# Patient Record
Sex: Female | Born: 1980
Health system: Southern US, Community
[De-identification: ages and names within clinical notes are randomized; demographics above are authoritative.]

## PROBLEM LIST (undated history)

## (undated) DIAGNOSIS — D219 Benign neoplasm of connective and other soft tissue, unspecified: Secondary | ICD-10-CM

## (undated) DIAGNOSIS — L98439 Non-pressure chronic ulcer of abdomen with unspecified severity: Secondary | ICD-10-CM

## (undated) DIAGNOSIS — D649 Anemia, unspecified: Secondary | ICD-10-CM

## (undated) DIAGNOSIS — D573 Sickle-cell trait: Secondary | ICD-10-CM

## (undated) DIAGNOSIS — Z5189 Encounter for other specified aftercare: Secondary | ICD-10-CM

## (undated) DIAGNOSIS — K219 Gastro-esophageal reflux disease without esophagitis: Secondary | ICD-10-CM

## (undated) DIAGNOSIS — L98499 Non-pressure chronic ulcer of skin of other sites with unspecified severity: Secondary | ICD-10-CM

## (undated) HISTORY — PX: WISDOM TOOTH EXTRACTION: SHX21

## (undated) HISTORY — DX: Benign neoplasm of connective and other soft tissue, unspecified: D21.9

## (undated) HISTORY — DX: Non-pressure chronic ulcer of skin of other sites with unspecified severity: L98.499

## (undated) HISTORY — DX: Anemia, unspecified: D64.9

## (undated) HISTORY — DX: Non-pressure chronic ulcer of abdomen with unspecified severity: L98.439

---

## 2005-12-23 ENCOUNTER — Emergency Department: Payer: Self-pay | Admitting: Emergency Medicine

## 2006-09-24 ENCOUNTER — Emergency Department: Payer: Self-pay | Admitting: Emergency Medicine

## 2006-10-04 ENCOUNTER — Emergency Department: Payer: Self-pay | Admitting: Emergency Medicine

## 2007-10-17 IMAGING — CR RIGHT FOOT COMPLETE - 3+ VIEW
1 series · 3 of 3 positions shown · non-contrast
Comparison: none

REASON FOR EXAM: Injury.....MINOR CARE 2
COMMENTS:  LMP: [DATE]

[Series 1: view not recorded · 0.17mm/px · 3 of 3 slices shown]
[im 1/3]
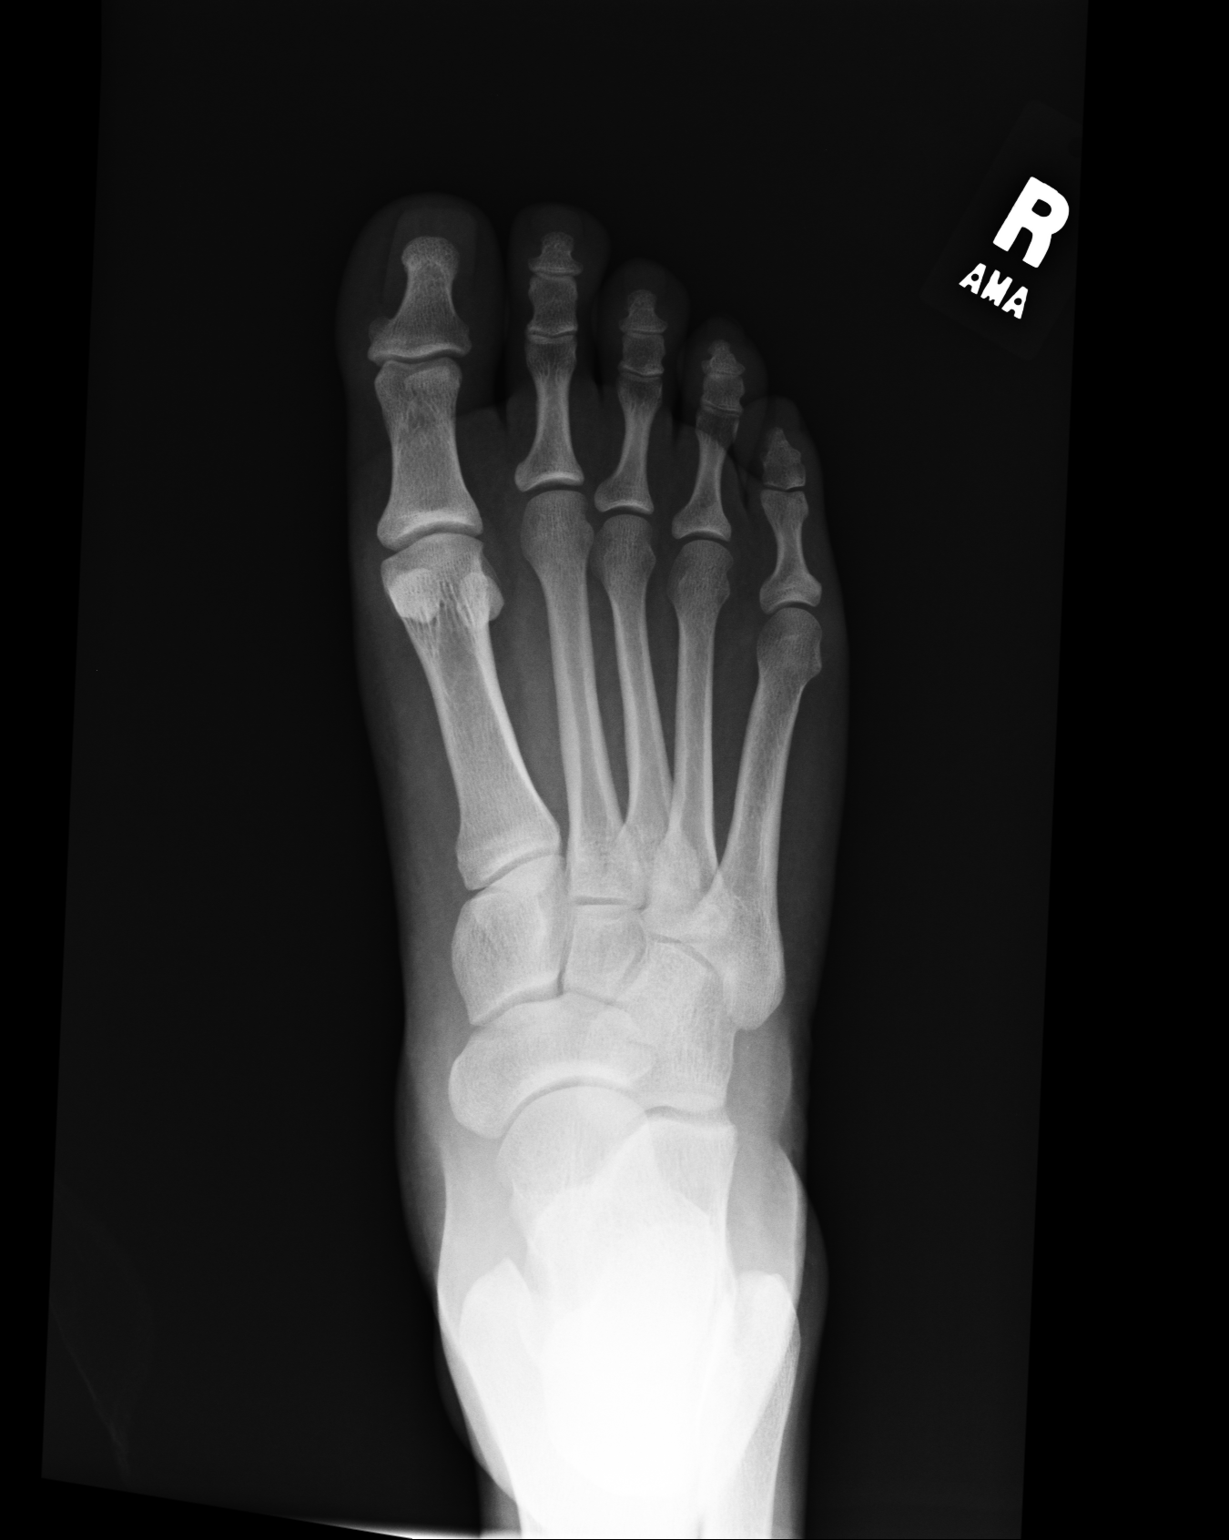
[im 2/3]
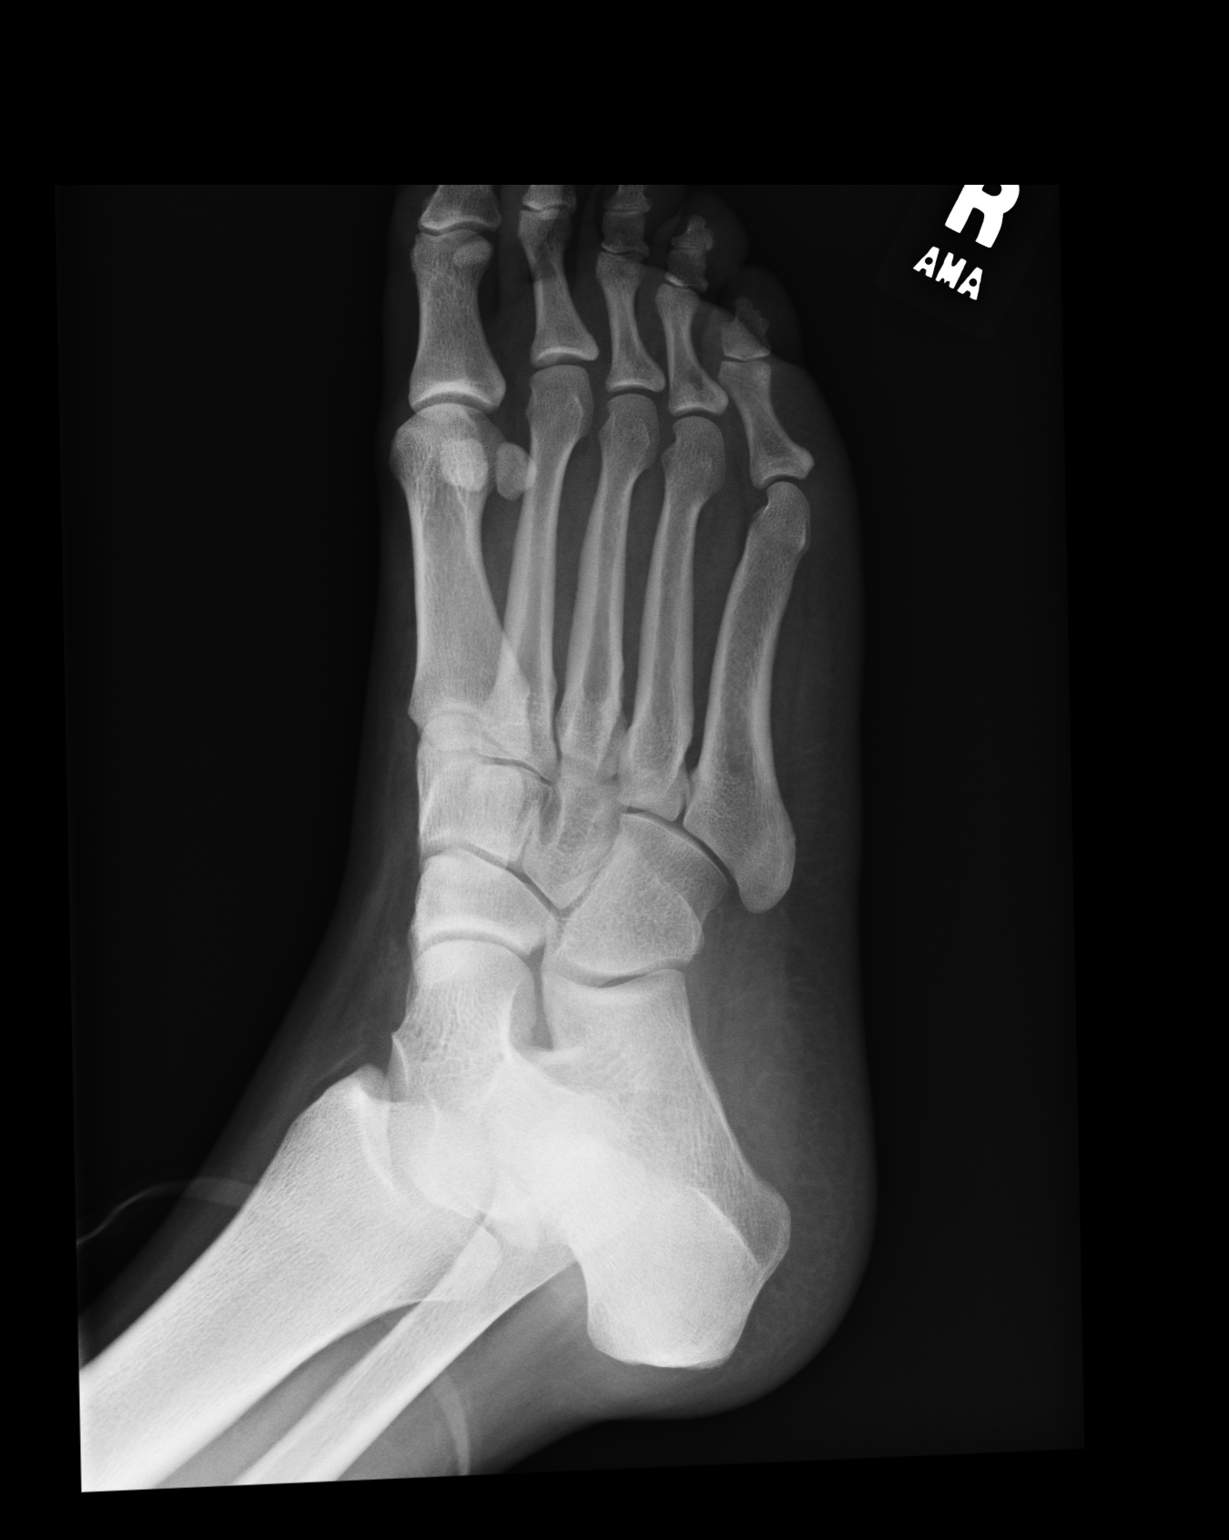
[im 3/3]
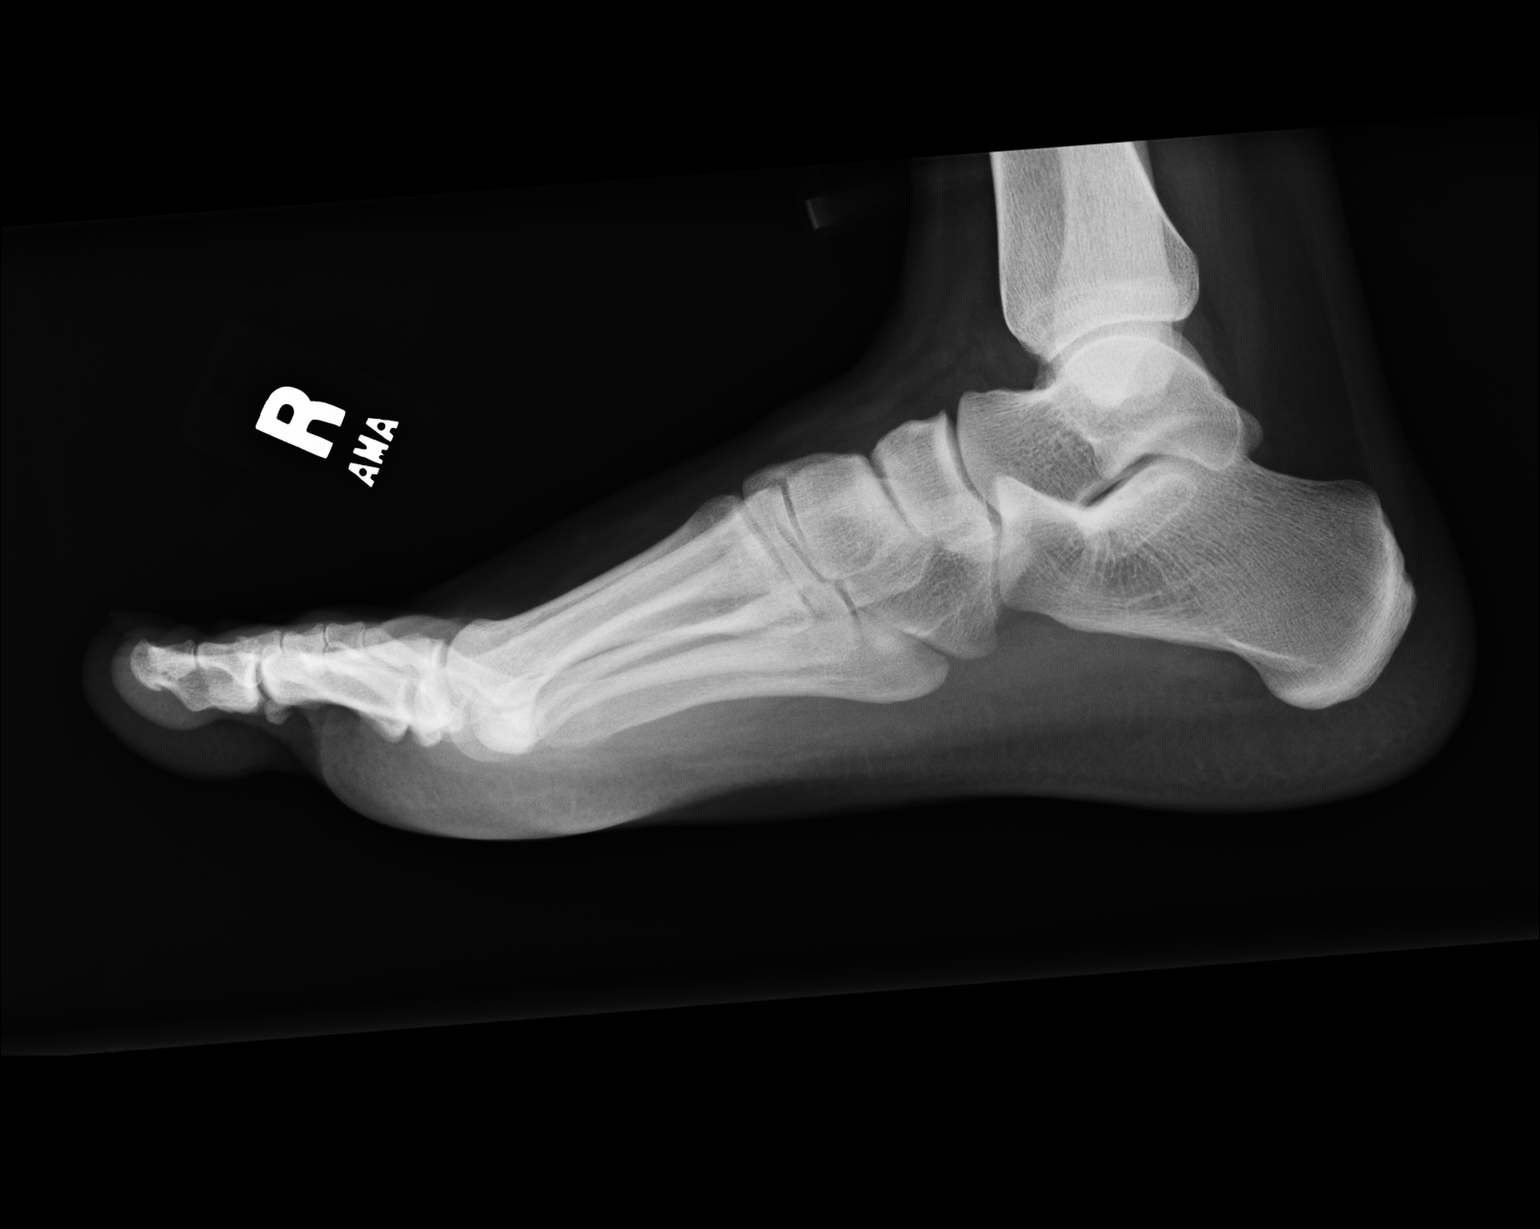

[3 of 3 positions shown; findings below may reference images not displayed]

PROCEDURE:     DXR - DXR FOOT RT COMPLETE W/OBLIQUES  - October 04, 2006  [DATE]

RESULT:     The patient sustained a puncture wound involving the RIGHT foot.

The bones of the foot appear adequately mineralized.  I do not see evidence
of acute fracture.  I see no soft tissue gas or radiopaque material.
IMPRESSION: I do not see acute bony abnormality of the RIGHT foot or evidence of
retained radiopaque foreign body.

## 2008-01-21 ENCOUNTER — Emergency Department: Payer: Self-pay | Admitting: Emergency Medicine

## 2010-12-14 ENCOUNTER — Emergency Department: Payer: Self-pay | Admitting: Emergency Medicine

## 2011-03-10 ENCOUNTER — Emergency Department: Payer: Self-pay | Admitting: Emergency Medicine

## 2011-12-16 ENCOUNTER — Emergency Department: Payer: Self-pay | Admitting: Emergency Medicine

## 2013-05-28 ENCOUNTER — Emergency Department: Payer: Self-pay | Admitting: Emergency Medicine

## 2013-05-31 ENCOUNTER — Emergency Department: Payer: Self-pay | Admitting: Emergency Medicine

## 2014-01-13 ENCOUNTER — Emergency Department: Payer: Self-pay | Admitting: Emergency Medicine

## 2014-09-16 ENCOUNTER — Emergency Department: Payer: Self-pay | Admitting: Emergency Medicine

## 2015-04-27 ENCOUNTER — Emergency Department
Admission: EM | Admit: 2015-04-27 | Discharge: 2015-04-27 | Disposition: A | Discharge status: Home / Self Care | Payer: Self-pay | Attending: Emergency Medicine | Admitting: Emergency Medicine

## 2015-04-27 ENCOUNTER — Encounter: Payer: Self-pay | Admitting: Emergency Medicine

## 2015-04-27 DIAGNOSIS — Y288XXA Contact with other sharp object, undetermined intent, initial encounter: Secondary | ICD-10-CM | POA: Insufficient documentation

## 2015-04-27 DIAGNOSIS — Z79899 Other long term (current) drug therapy: Secondary | ICD-10-CM | POA: Insufficient documentation

## 2015-04-27 DIAGNOSIS — Y9289 Other specified places as the place of occurrence of the external cause: Secondary | ICD-10-CM | POA: Insufficient documentation

## 2015-04-27 DIAGNOSIS — Y998 Other external cause status: Secondary | ICD-10-CM | POA: Insufficient documentation

## 2015-04-27 DIAGNOSIS — S81811A Laceration without foreign body, right lower leg, initial encounter: Secondary | ICD-10-CM | POA: Insufficient documentation

## 2015-04-27 DIAGNOSIS — Z720 Tobacco use: Secondary | ICD-10-CM | POA: Insufficient documentation

## 2015-04-27 DIAGNOSIS — Y9389 Activity, other specified: Secondary | ICD-10-CM | POA: Insufficient documentation

## 2015-04-27 DIAGNOSIS — Z23 Encounter for immunization: Secondary | ICD-10-CM | POA: Insufficient documentation

## 2015-04-27 HISTORY — DX: Sickle-cell trait: D57.3

## 2015-04-27 MED ORDER — BACITRACIN ZINC 500 UNIT/GM EX OINT
TOPICAL_OINTMENT | CUTANEOUS | Status: AC
  Administered 2015-04-27: 05:00:00 via TRANSDERMAL
  Filled 2015-04-27: qty 0.9

## 2015-04-27 MED ORDER — TRAMADOL HCL 50 MG PO TABS: 50.0000 mg | ORAL_TABLET | Freq: Four times a day (QID) | ORAL | Status: DC | PRN

## 2015-04-27 MED ORDER — TETANUS-DIPHTH-ACELL PERTUSSIS 5-2.5-18.5 LF-MCG/0.5 IM SUSP
0.5000 mL | Freq: Once | INTRAMUSCULAR | Status: AC
  Administered 2015-04-27: 0.5 mL via INTRAMUSCULAR
  Filled 2015-04-27: qty 0.5

## 2015-04-27 MED ORDER — LIDOCAINE-EPINEPHRINE (PF) 1 %-1:200000 IJ SOLN
INTRAMUSCULAR | Status: AC
  Filled 2015-04-27: qty 30

## 2015-04-27 NOTE — ED Notes (Signed)
Pt with laceration to the right shin; says she was swinging around a broken metal broom when it struck her leg; bleeding controlled; dressing applied in triage

## 2015-04-27 NOTE — Discharge Instructions (Signed)
Please see her doctor or return to the emergency department in 7-10 days for suture removal. Return sooner, if he notices any signs of infection such as increased redness, pus, or develop a fever. Please keep the area clean and you may use warm soap and water to clean. Keep covered with Neosporin and a dressing.   Laceration Care, Adult A laceration is a cut that goes through all layers of the skin. The cut goes into the tissue beneath the skin. HOME CARE For stitches (sutures) or staples:  Keep the cut clean and dry.  If you have a bandage (dressing), change it at least once a day. Change the bandage if it gets wet or dirty, or as told by your doctor.  Wash the cut with soap and water 2 times a day. Rinse the cut with water. Pat it dry with a clean towel.  Put a thin layer of medicated cream on the cut as told by your doctor.  You may shower after the first 24 hours. Do not soak the cut in water until the stitches are removed.  Only take medicines as told by your doctor.  Have your stitches or staples removed as told by your doctor. For skin adhesive strips:  Keep the cut clean and dry.  Do not get the strips wet. You may take a bath, but be careful to keep the cut dry.  If the cut gets wet, pat it dry with a clean towel.  The strips will fall off on their own. Do not remove the strips that are still stuck to the cut. For wound glue:  You may shower or take baths. Do not soak or scrub the cut. Do not swim. Avoid heavy sweating until the glue falls off on its own. After a shower or bath, pat the cut dry with a clean towel.  Do not put medicine on your cut until the glue falls off.  If you have a bandage, do not put tape over the glue.  Avoid lots of sunlight or tanning lamps until the glue falls off. Put sunscreen on the cut for the first year to reduce your scar.  The glue will fall off on its own. Do not pick at the glue. You may need a tetanus shot if:  You cannot  remember when you had your last tetanus shot.  You have never had a tetanus shot. If you need a tetanus shot and you choose not to have one, you may get tetanus. Sickness from tetanus can be serious. GET HELP RIGHT AWAY IF:   Your pain does not get better with medicine.  Your arm, hand, leg, or foot loses feeling (numbness) or changes color.  Your cut is bleeding.  Your joint feels weak, or you cannot use your joint.  You have painful lumps on your body.  Your cut is red, puffy (swollen), or painful.  You have a red line on the skin near the cut.  You have yellowish-white fluid (pus) coming from the cut.  You have a fever.  You have a bad smell coming from the cut or bandage.  Your cut breaks open before or after stitches are removed.  You notice something coming out of the cut, such as wood or glass.  You cannot move a finger or toe. MAKE SURE YOU:   Understand these instructions.  Will watch your condition.  Will get help right away if you are not doing well or get worse. Document Released: 03/07/2008 Document Revised:  12/12/2011 Document Reviewed: 03/15/2011 ExitCare Patient Information 2015 [place], [place]. This information is not intended to replace advice given to you by your health care provider. Make sure you discuss any questions you have with your health care provider.

## 2015-04-27 NOTE — ED Provider Notes (Signed)
[facility] Emergency Department Provider Note  Time seen: 3:49 AM  I have reviewed the triage vital signs and the nursing notes.   HISTORY  Chief Complaint Extremity Laceration    HPI Carolyn [name] is a 34 y.o. female who presents the emergency department with a laceration to her right lower [place]. Patient states she had a right lower extremity with a metal broom. States the bleeding was controlled at home with pressure. Patient does not know her last tetanus shot was. Patient denies any numbness or tingling.     Past Medical History  Diagnosis Date  . Sickle cell trait     There are no active problems to display for this patient.   History reviewed. No pertinent past surgical history.  Current Outpatient Rx  Name  Route  Sig  Dispense  Refill  . diphenhydramine-acetaminophen (TYLENOL PM) 25-500 MG TABS   Oral   Take 1 tablet by mouth at bedtime as needed.         . Multiple Vitamin (MULTIVITAMIN WITH MINERALS) TABS tablet   Oral   Take 1 tablet by mouth daily.           Allergies Review of patient's allergies indicates no known allergies.  History reviewed. No pertinent family history.  Social History History  Substance Use Topics  . Smoking status: Current Every Day Smoker  . Smokeless tobacco: Not on file  . Alcohol Use: Yes    Review of Systems Constitutional: Negative for fever. Cardiovascular: Negative for chest pain. Respiratory: Negative for shortness of breath. Gastrointestinal: Negative for abdominal pain Skin: Positive for laceration to right lower extremity. 10-point ROS otherwise negative.  ____________________________________________   PHYSICAL EXAM:  VITAL SIGNS: ED Triage Vitals  Enc Vitals Group     BP 04/27/15 0037 124/66 mmHg     Pulse Rate 04/27/15 0037 89     Resp 04/27/15 0037 18     Temp 04/27/15 0037 98.6 F (37 C)     Temp src --      SpO2 04/27/15 0037 100 %     Weight 04/27/15  0037 180 lb (81.647 kg)     Height 04/27/15 0037 5\' 4"  (1.626 m)     Head Cir --      Peak Flow --      Pain Score 04/27/15 0038 7     Pain Loc --      Pain Edu? --      Excl. in [place]? --     Constitutional: Alert and oriented. Well appearing and in no distress. ENT   Head: Normocephalic and atraumatic. Cardiovascular: Normal rate, regular rhythm. No murmur Respiratory: Normal respiratory effort without tachypnea nor retractions. Breath sounds are clear Gastrointestinal: Soft and nontender. No distention. Musculoskeletal: 4 cm laceration to right lower extremity. Hemostatic. Neurovascularly intact distally. Neurologic:  Normal speech and language. No gross focal neurologic deficits Skin:  Skin is warm, dry. Laceration as above. Psychiatric: Mood and affect are normal. Speech and behavior are normal.  ____________________________________________   INITIAL IMPRESSION / ASSESSMENT AND PLAN / ED COURSE  Pertinent labs & imaging results that were available during my care of the patient were reviewed by me and considered in my medical decision making (see chart for details).  Patient were for semi-laceration right lower extremity. No other injuries identified. Currently hemostatic. Patient repaired with sutures. Tetanus updated.  LACERATION REPAIR Performed by: Harvest Dark Authorized by: Harvest Dark Consent: Verbal consent obtained. Risks and benefits: risks,  benefits and alternatives were discussed Consent given by: patient Patient identity confirmed: provided demographic data Prepped and Draped in normal sterile fashion Wound explored  Laceration Location: Right lower extremity  Laceration Length: 4 cm  No Foreign Bodies seen or palpated  Anesthesia: local infiltration  Local anesthetic: lidocaine 1 % with epinephrine  Anesthetic total: 8 ml  Irrigation method: syringe Amount of cleaning: standard  Skin closure: Sutures, 4-0 proline   Number of  sutures: 7   Technique: Simple and rapid   Patient tolerance: Patient tolerated the procedure well with no immediate complications.  ____________________________________________   FINAL CLINICAL IMPRESSION(S) / ED DIAGNOSES  Laceration   Harvest Dark, MD 04/27/15 [phone]

## 2015-07-14 DIAGNOSIS — A6000 Herpesviral infection of urogenital system, unspecified: Secondary | ICD-10-CM | POA: Insufficient documentation

## 2015-07-14 DIAGNOSIS — E663 Overweight: Secondary | ICD-10-CM | POA: Insufficient documentation

## 2015-09-15 ENCOUNTER — Encounter: Payer: Self-pay | Admitting: Emergency Medicine

## 2015-09-15 ENCOUNTER — Emergency Department
Admission: EM | Admit: 2015-09-15 | Discharge: 2015-09-16 | Disposition: A | Discharge status: Home / Self Care | Payer: Self-pay | Attending: Emergency Medicine | Admitting: Emergency Medicine

## 2015-09-15 DIAGNOSIS — X58XXXA Exposure to other specified factors, initial encounter: Secondary | ICD-10-CM | POA: Insufficient documentation

## 2015-09-15 DIAGNOSIS — Y998 Other external cause status: Secondary | ICD-10-CM | POA: Insufficient documentation

## 2015-09-15 DIAGNOSIS — M545 Low back pain, unspecified: Secondary | ICD-10-CM

## 2015-09-15 DIAGNOSIS — S3992XA Unspecified injury of lower back, initial encounter: Secondary | ICD-10-CM | POA: Insufficient documentation

## 2015-09-15 DIAGNOSIS — Y9289 Other specified places as the place of occurrence of the external cause: Secondary | ICD-10-CM | POA: Insufficient documentation

## 2015-09-15 DIAGNOSIS — F17200 Nicotine dependence, unspecified, uncomplicated: Secondary | ICD-10-CM | POA: Insufficient documentation

## 2015-09-15 DIAGNOSIS — Z79899 Other long term (current) drug therapy: Secondary | ICD-10-CM | POA: Insufficient documentation

## 2015-09-15 DIAGNOSIS — Y9389 Activity, other specified: Secondary | ICD-10-CM | POA: Insufficient documentation

## 2015-09-15 NOTE — ED Provider Notes (Signed)
[facility] Emergency Department Provider Note  ____________________________________________  Time seen:   I have reviewed the triage vital signs and the nursing notes.   HISTORY  Chief Complaint Back Pain    HPI Carolyn [name] is a 34 y.o. female presents with bilateral low back pain 2 days after moving furniture. Patient denies any leg weakness or numbness no radiation of pain into the legs. Patient denies any urinary symptoms or change in bowel habits. Patient denies any fever. Pain is aggravated with ambulation and movement of the back. Current pain score 7 out of 10.    Past Medical History  Diagnosis Date  . Sickle cell trait ([place])     There are no active problems to display for this patient.   History reviewed. No pertinent past surgical history.  Current Outpatient Rx  Name  Route  Sig  Dispense  Refill  . diphenhydramine-acetaminophen (TYLENOL PM) 25-500 MG TABS   Oral   Take 1 tablet by mouth at bedtime as needed.         . Multiple Vitamin (MULTIVITAMIN WITH MINERALS) TABS tablet   Oral   Take 1 tablet by mouth daily.         . traMADol (ULTRAM) 50 MG tablet   Oral   Take 1 tablet (50 mg total) by mouth every 6 (six) hours as needed.   20 tablet   0     Allergies Review of patient's allergies indicates no known allergies.  No family history on file.  Social History Social History  Substance Use Topics  . Smoking status: Current Every Day Smoker  . Smokeless tobacco: None  . Alcohol Use: Yes    Review of Systems  Constitutional: Negative for fever. Eyes: Negative for visual changes. ENT: Negative for sore throat. Cardiovascular: Negative for chest pain. Respiratory: Negative for shortness of breath. Gastrointestinal: Negative for abdominal pain, vomiting and diarrhea. Genitourinary: Negative for dysuria. Musculoskeletal: Positive for back pain. Skin: Negative for rash. Neurological: Negative for  headaches, focal weakness or numbness.   10-point ROS otherwise negative.  ____________________________________________   PHYSICAL EXAM:  VITAL SIGNS: ED Triage Vitals  Enc Vitals Group     BP 09/15/15 2330 119/89 mmHg     Pulse Rate 09/15/15 2330 83     Resp 09/15/15 2330 18     Temp 09/15/15 2330 97.9 F (36.6 C)     Temp Source 09/15/15 2330 Oral     SpO2 09/15/15 2330 100 %     Weight 09/15/15 2330 175 lb (79.379 kg)     Height 09/15/15 2330 5\' 5"  (1.651 m)     Head Cir --      Peak Flow --      Pain Score 09/15/15 2329 10     Pain Loc --      Pain Edu? --      Excl. in [place]? --      Constitutional: Alert and oriented. Well appearing and in no distress. Eyes: Conjunctivae are normal. PERRL. Normal extraocular movements. ENT   Head: Normocephalic and atraumatic.   Nose: No congestion/rhinnorhea.   Mouth/Throat: Mucous membranes are moist.   Neck: No stridor. Hematological/Lymphatic/Immunilogical: No cervical lymphadenopathy. Cardiovascular: Normal rate, regular rhythm. Normal and symmetric distal pulses are present in all extremities. No murmurs, rubs, or gallops. Respiratory: Normal respiratory effort without tachypnea nor retractions. Breath sounds are clear and equal bilaterally. No wheezes/rales/rhonchi. Gastrointestinal: Soft and nontender. No distention. There is no CVA tenderness. Genitourinary: deferred  Musculoskeletal: Nontender with normal range of motion in all extremities. No joint effusions.  No lower extremity tenderness nor edema.  Palpation of paraspinal muscles of the lumbar spine. Pain with active and passive range of motion of the lumbar spine. Neurologic:  Normal speech and language. No gross focal neurologic deficits are appreciated. Speech is normal.  Skin:  Skin is warm, dry and intact. No rash noted.    INITIAL IMPRESSION / ASSESSMENT AND PLAN / ED COURSE  Pertinent labs & imaging results that were available during my care of  the patient were reviewed by me and considered in my medical decision making (see chart for details).  Patient received Toradol 10 mg by mouth and Flexeril 10 mg by mouth will be prescribed same at home. Patient is advised to follow-up with Dr. Mack Guise orthopedist on call at discomfort progress ____________________________________________   FINAL CLINICAL IMPRESSION(S) / ED DIAGNOSES  Final diagnoses:  Bilateral low back pain without sciatica      Gregor Hams, MD 09/16/15 [phone]

## 2015-09-15 NOTE — ED Notes (Signed)
Patient ambulatory to triage with steady gait, without difficulty or distress noted; pt reports lower back/hip pain x 2 days after moving furniture

## 2015-09-16 MED ORDER — CYCLOBENZAPRINE HCL 10 MG PO TABS
10.0000 mg | ORAL_TABLET | Freq: Once | ORAL | Status: AC
  Administered 2015-09-16: 10 mg via ORAL
  Filled 2015-09-16: qty 1

## 2015-09-16 MED ORDER — KETOROLAC TROMETHAMINE 10 MG PO TABS: 10.0000 mg | ORAL_TABLET | Freq: Three times a day (TID) | ORAL | Status: DC | PRN

## 2015-09-16 MED ORDER — CYCLOBENZAPRINE HCL 10 MG PO TABS: 10.0000 mg | ORAL_TABLET | Freq: Three times a day (TID) | ORAL | Status: DC | PRN

## 2015-09-16 MED ORDER — KETOROLAC TROMETHAMINE 10 MG PO TABS
10.0000 mg | ORAL_TABLET | Freq: Once | ORAL | Status: AC
Start: 2015-09-16 — End: 2015-09-16
  Administered 2015-09-16: 10 mg via ORAL
  Filled 2015-09-16: qty 1

## 2015-09-16 NOTE — Discharge Instructions (Signed)

## 2016-02-11 ENCOUNTER — Emergency Department: Payer: Self-pay

## 2016-02-11 ENCOUNTER — Encounter: Payer: Self-pay | Admitting: Emergency Medicine

## 2016-02-11 ENCOUNTER — Emergency Department
Admission: EM | Admit: 2016-02-11 | Discharge: 2016-02-11 | Disposition: A | Discharge status: Home / Self Care | Payer: Self-pay | Attending: Emergency Medicine | Admitting: Emergency Medicine

## 2016-02-11 DIAGNOSIS — D573 Sickle-cell trait: Secondary | ICD-10-CM | POA: Insufficient documentation

## 2016-02-11 DIAGNOSIS — F17210 Nicotine dependence, cigarettes, uncomplicated: Secondary | ICD-10-CM | POA: Insufficient documentation

## 2016-02-11 DIAGNOSIS — I808 Phlebitis and thrombophlebitis of other sites: Secondary | ICD-10-CM | POA: Insufficient documentation

## 2016-02-11 DIAGNOSIS — N63 Unspecified lump in unspecified breast: Secondary | ICD-10-CM

## 2016-02-11 DIAGNOSIS — I82890 Acute embolism and thrombosis of other specified veins: Secondary | ICD-10-CM

## 2016-02-11 LAB — BASIC METABOLIC PANEL
Anion gap: 6 (ref 5–15)
BUN: 10 mg/dL (ref 6–20)
CALCIUM: 9.3 mg/dL (ref 8.9–10.3)
CHLORIDE: 107 mmol/L (ref 101–111)
CO2: 25 mmol/L (ref 22–32)
Creatinine, Ser: 0.82 mg/dL (ref 0.44–1.00)
GFR calc Af Amer: >60 mL/min (ref >60)
GFR calc non Af Amer: >60 mL/min (ref >60)
Glucose, Bld: 94 mg/dL (ref 65–99)
Potassium: 3.4 mmol/L — ABNORMAL LOW (ref 3.5–5.1)
Sodium: 138 mmol/L (ref 135–145)

## 2016-02-11 LAB — CBC WITH DIFFERENTIAL/PLATELET
BASOS ABS: 0 10*3/uL (ref 0–0.1)
Basophils Relative: 0 %
Eosinophils Absolute: 0 10*3/uL (ref 0–0.7)
Eosinophils Relative: 0 %
HCT: 26.4 % — ABNORMAL LOW (ref 35.0–47.0)
HEMOGLOBIN: 7.7 g/dL — AB (ref 12.0–16.0)
LYMPHS ABS: 2.6 10*3/uL (ref 1.0–3.6)
Lymphocytes Relative: 27 %
MCH: 17.8 pg — ABNORMAL LOW (ref 26.0–34.0)
MCHC: 29.2 g/dL — ABNORMAL LOW (ref 32.0–36.0)
MCV: 61 fL — ABNORMAL LOW (ref 80.0–100.0)
Monocytes Absolute: 0.4 10*3/uL (ref 0.2–0.9)
Monocytes Relative: 4 %
Neutro Abs: 6.5 10*3/uL (ref 1.4–6.5)
Platelets: 287 10*3/uL (ref 150–440)
RBC: 4.32 MIL/uL (ref 3.80–5.20)
RDW: 20.4 % — ABNORMAL HIGH (ref 11.5–14.5)
WBC: 9.6 10*3/uL (ref 3.6–11.0)

## 2016-02-11 MED ORDER — HYDROCODONE-ACETAMINOPHEN 5-325 MG PO TABS: 1.0000 | ORAL_TABLET | ORAL | Status: DC | PRN

## 2016-02-11 MED ORDER — MELOXICAM 15 MG PO TABS: 15.0000 mg | ORAL_TABLET | Freq: Every day | ORAL | Status: DC

## 2016-02-11 NOTE — ED Provider Notes (Signed)
[name] Emergency Department Provider Note  ____________________________________________  Time seen: Approximately 7:24 PM  I have reviewed the triage vital signs and the nursing notes.   HISTORY  Chief Complaint Breast Problem    HPI Carolyn [name] is a 35 y.o. female who presents for evaluation of a painful knot to her right breast noted approximately 3 days progressively getting worse. Patient states that she's noticed a linear line approximately 3 inches running down from the upper corner right breast down to her nipple. And she isn't felt some knots in her left antecubital area.   Past Medical History  Diagnosis Date  . Sickle cell trait ([place])     There are no active problems to display for this patient.   History reviewed. No pertinent past surgical history.  Current Outpatient Rx  Name  Route  Sig  Dispense  Refill  . HYDROcodone-acetaminophen (NORCO) 5-325 MG tablet   Oral   Take 1-2 tablets by mouth every 4 (four) hours as needed for moderate pain.   10 tablet   0   . meloxicam (MOBIC) 15 MG tablet   Oral   Take 1 tablet (15 mg total) by mouth daily.   30 tablet   0     Allergies Review of patient's allergies indicates no known allergies.  No family history on file.  Social History Social History  Substance Use Topics  . Smoking status: Current Every Day Smoker -- 0.50 packs/day    Types: Cigarettes  . Smokeless tobacco: None  . Alcohol Use: Yes    Review of Systems Constitutional: No fever/chills Cardiovascular: Denies chest pain. Respiratory: Denies shortness of breath. Gastrointestinal: No abdominal pain.  No nausea, no vomiting.  No diarrhea.  No constipation. Genitourinary: Negative for dysuria. Musculoskeletal: Negative for back pain. Skin: Positive for painful lesions in the right breast and antecubital fossa of the right arm Neurological: Negative for headaches, focal weakness or numbness.  10-point  ROS otherwise negative.  ____________________________________________   PHYSICAL EXAM:  VITAL SIGNS: ED Triage Vitals  Enc Vitals Group     BP 02/11/16 1904 118/68 mmHg     Pulse Rate 02/11/16 1904 89     Resp 02/11/16 1904 18     Temp 02/11/16 1904 98.6 F (37 C)     Temp Source 02/11/16 1904 Oral     SpO2 02/11/16 1904 100 %     Weight 02/11/16 1904 180 lb (81.647 kg)     Height 02/11/16 1904 5\' 9"  (1.753 m)     Head Cir --      Peak Flow --      Pain Score 02/11/16 1913 10     Pain Loc --      Pain Edu? --      Excl. in [place]? --     Constitutional: Alert and oriented. Well appearing and in no acute distress.  Cardiovascular: Normal rate, regular rhythm. Grossly normal heart sounds.  Good peripheral circulation. Respiratory: Normal respiratory effort.  No retractions. Lungs CTAB. Musculoskeletal: No lower extremity tenderness nor edema.  No joint effusions. Neurologic:  Normal speech and language. No gross focal neurologic deficits are appreciated. No gait instability. Skin:  Skin is warm, dry and intact. No rash noted.+3 cm lesion noticed proximally about 5:00 on her right breast and a 3-4 cm linear, cord-like lesion extending down from the tail of [place] down to the nipple. No erythema or drainage noted. Psychiatric: Mood and affect are normal. Speech and behavior  are normal.  ____________________________________________   LABS (all labs ordered are listed, but only abnormal results are displayed)  Labs Reviewed  BASIC METABOLIC PANEL - Abnormal; Notable for the following:    Potassium 3.4 (*)    All other components within normal limits  CBC WITH DIFFERENTIAL/PLATELET - Abnormal; Notable for the following:    Hemoglobin 7.7 (*)    HCT 26.4 (*)    MCV 61.0 (*)    MCH 17.8 (*)    MCHC 29.2 (*)    RDW 20.4 (*)    All other components within normal limits     RADIOLOGY  IMPRESSION: No fluid collection or abscess.  Thrombosed superficial vein of the right  breast and anterior chest wall. ____________________________________________   PROCEDURES  Procedure(s) performed: None  Critical Care performed: No  ____________________________________________   INITIAL IMPRESSION / ASSESSMENT AND PLAN / ED COURSE  Pertinent labs & imaging results that were available during my care of the patient were reviewed by me and considered in my medical decision making (see chart for details).  Superficial thrombosed vein of the right breast and anterior chest wall. Discussed all clinical findings with the patient discussed her hemoglobin is 7.7. Patient reports that she is being treated with iron replacement and she will follow-up with her PCP for continued evaluation and will schedule mammogram. Patient was discharged home with meloxicam and hydrocodone for severe pain. ____________________________________________   FINAL CLINICAL IMPRESSION(S) / ED DIAGNOSES  Final diagnoses:  Lump in female breast  Superficial vein thrombosis     This chart was dictated using voice recognition software/Dragon. Despite best efforts to proofread, errors can occur which can change the meaning. Any change was purely unintentional.   Arlyss Repress, PA-C 02/11/16 2226  Lavonia Drafts, MD 02/11/16 2242

## 2016-02-11 NOTE — ED Notes (Addendum)
No drainage, scabs, wounds or discoloration to area.  NAD.  Tenderness w/ palpation.

## 2016-02-11 NOTE — Discharge Instructions (Signed)
Venous Thromboembolism Venous thromboembolism (VTE) is a condition in which a blood clot (thrombus) develops in the body. A thrombus usually occurs in a deep vein in the leg or the pelvis, but it can also occur in the arm. Sometimes, pieces of a thrombus can break off from its original place of development and travel through the bloodstream to other parts of the body. When that happens, the thrombus is called an embolism. An embolism can block the blood flow in the blood vessels of other organs. There are two serious types of VTE:  Deep vein thrombosis (DVT). A DVT is a thrombus that usually occurs in a deep, larger vein of the lower leg or the pelvis, or in an upper extremity such as the arm.  Pulmonary embolism (PE). A PE occurs when an embolism has formed and traveled to the lungs. A PE can block or decrease the blood flow in one lung or both lungs. VTE is a serious health condition that can cause disability or death. It is very important to get help right away and to not ignore symptoms. CAUSES VTE is caused by the formation of a blood clot in your leg, pelvis, or arm. Usually, several things contribute to the formation of blood clots. A clot may develop when:  Your blood flow slows down.  Your vein becomes damaged in some way.  You have a condition that makes your blood clot more easily. RISK FACTORS A VTE is more likely to develop in:  People who are older, especially over 6 years of age.  People who are overweight (obese).  People who sit or lie still for a long time, such as during long-distance travel (over 4 hours), bed rest, hospitalization, or during recovery from certain medical conditions like a stroke.  People who do not engage in much physical activity (sedentary lifestyle).  People who have chronic breathing disorders.  People who have a personal or family history of blood clots or blood clotting disease.  People who have peripheral vascular disease (PVD), diabetes,  or some types of cancer.  People who have heart disease, especially if the person had a recent heart attack or has congestive heart failure.  People who have neurological diseases that affect the legs (leg paresis).  People who have had a traumatic injury, such as breaking a hip or leg.  People who have recently had major or lengthy surgery, especially on the hip, knee, or abdomen.  People who have had a central line placed inside a large vein.  People who take medicines that contain the hormone estrogen. These include birth control pills and hormone replacement therapy.  Pregnancy or during childbirth or the postpartum period.  Long plane flights (over 8 hours). SIGNS AND SYMPTOMS  Symptoms of VTE can depend on where the clot is located and whether the clot breaks off and travels to another organ. Sometimes, there may be no symptoms. Symptoms of a DVT can include:  Swelling of your leg or arm, especially if one side is much worse.  Warmth and redness of your leg or arm, especially if one side is much worse.  Pain in your arm or leg. If the clot is in your leg, symptoms may be more noticeable or worse when you stand or walk.  A feeling of pins and needles if the clot is in the arm. The symptoms of a PE usually start suddenly and include:  Shortness of breath while active or at rest.  Coughing or coughing up blood or  blood-tinged mucus.  Chest pain that is often worse with deep breaths.  Rapid or irregular heartbeat.  Feeling light-headed or dizzy.  Fainting.  Feeling anxious.  Sweating. There may also be pain and swelling in a leg if that is where the blood clot started. These symptoms may represent a serious problem that is an emergency. Do not wait to see if the symptoms will go away. Get medical help right away. Call your local emergency services (911 in the [place] Do not drive yourself to the hospital. DIAGNOSIS Your health care provider will take a medical history  and perform a physical exam. You may also have other tests, including:  Blood tests to assess the clotting properties of your blood.  Imaging tests, such as CT, ultrasound, MRI, X-ray, and other tests to see if you have clots anywhere in your body.  An electrocardiogram (ECG) to look for heart strain from blood clots in the lungs.  An echocardiogram. TREATMENT After a VTE is identified, it can be treated. The main goals of treatment are:  To stop a blood clot from growing larger.  To stop new blood clots from forming.  To stop a blood clot from traveling to the lungs (pulmonary embolism). The type of treatment that you receive depends on many factors, such as the cause of your VTE, your risk for bleeding or developing more clots, and other medical conditions that you have. Sometimes, a combination of treatments is necessary. Treatment options may be combined and include:  Monitoring the blood clot with ultrasound.  Taking medicines by mouth, such as newer blood thinners (anticoagulants), thrombolytics, or warfarin.  Taking anticoagulant medicine by injection or through an IV tube.  Wearingcompression stockings or using different types of devices.  Surgery (rare) to remove the blood clot or to place a filter in your abdomen to stop the blood clot from traveling to your lungs. Treatments for VTE are often divided into immediate treatment and long-term treatment (up to 3 months after VTE). You can work with your health care provider to choose the treatment program that is best for you. HOME CARE INSTRUCTIONS If you are taking a newer oral anticoagulant:  Take the medicine every single day at the same time each day.  Understand what foods and drugs interact with this medicine.  Understand that there are no regular blood tests required when using this medicine.  Understand the side effects of this medicine, including excessive bruising or bleeding. Ask your health care provider or  pharmacist about other possible side effects. If you are taking warfarin:  Understand how to take warfarin and know which foods can affect how warfarin works in Veterinary surgeon.  Understand that it is dangerous to take too much or too little warfarin. Too much warfarin increases the risk of bleeding. Too little warfarin continues to allow the risk for blood clots.  Follow your PT and INR blood testing schedule. The PT and INR results allow your health care provider to adjust your dose of warfarin. It is very important that you have your PT and INR tested as often as told by your health care provider.  Avoid major changes in your diet, or tell your health care provider before you change your diet. Arrange a visit with a registered dietitian to answer your questions. Many foods, especially foods that are high in vitamin K, can interfere with warfarin and affect the PT and INR results. Eat a consistent amount of foods that are high in vitamin K, such as:  Spinach, kale, broccoli, cabbage, collard greens, turnip greens, Brussels sprouts, peas, cauliflower, seaweed, and parsley.  Beef liver and pork liver.  Green tea.  Soybean oil.  Tell your health care provider about any and all medicines, vitamins, and supplements that you take, including aspirin and other over-the-counter anti-inflammatory medicines. Be especially cautious with aspirin and anti-inflammatory medicines. Do not take those before you ask your health care provider if it is safe to do so. This is important because many medicines can interfere with warfarin and affect the PT and INR results.  Do not start or stop taking any over-the-counter or prescription medicine unless your health care provider or pharmacist tells you to do so. If you take warfarin, you will also need to do these things:  Hold pressure over cuts for longer than usual.  Tell your dentist and other health care providers that you are taking warfarin before you have any  procedures in which bleeding may occur.  Avoid alcohol or drink very small amounts. Tell your health care provider if you change your alcohol intake.  Do not use tobacco products, including cigarettes, chewing tobacco, and e-cigarettes. If you need help quitting, ask your health care provider.  Avoid contact sports. General Instructions  Take over-the-counter and prescription medicines only as told by your health care provider. Anticoagulant medicines can have side effects, including easy bruising and difficulty stopping bleeding. If you are prescribed an anticoagulant, you will also need to do these things:  Hold pressure over cuts for longer than usual.  Tell your dentist and other health care providers that you are taking anticoagulants before you have any procedures in which bleeding may occur.  Avoid contact sports.  Wear a medical alert bracelet or carry a medical alert card that says you have had a PE.  Ask your health care provider how soon you can go back to your normal activities. Stay active to prevent new blood clots from forming.  Make sure to exercise while traveling or when you have been sitting or standing for a long period of time. It is very important to exercise. Exercise your legs by walking or by tightening and relaxing your leg muscles often. Take frequent walks.  Wear compression stockings as told by your health care provider to help prevent more blood clots from forming.  Do not use tobacco products, including cigarettes, chewing tobacco, and e-cigarettes. If you need help quitting, ask your health care provider.  Keep all follow-up appointments with your health care provider. This is important. PREVENTION Take these actions to decrease your risk of developing another VTE:  Exercise regularly. For at least 30 minutes every day, engage in:  Activity that involves moving your arms and legs.  Activity that encourages good blood flow through your body by  increasing your heart rate.  Exercise your arms and legs every hour during long-distance travel (over 4 hours). Drink plenty of water and avoid drinking alcohol while traveling.  Avoid sitting or lying in bed for long periods of time without moving your legs.  Maintain a weight that is appropriate for your height. Ask your health care provider what weight is healthy for you.  If you are a woman who is over 46 years of age, avoid unnecessary use of medicines that contain estrogen. These include birth control pills.  Do not smoke, especially if you take estrogen medicines. If you need help quitting, ask your health care provider. If you are hospitalized, prevention measures may include:  Early walking after surgery,  as soon as your health care provider says that it is safe.  Receiving anticoagulants to prevent blood clots.If you cannot take anticoagulants, other options may be available, such as wearing compression stockings or using different types of devices. SEEK IMMEDIATE MEDICAL CARE IF:  You have new or increased pain, swelling, or redness in an arm or leg.  You have numbness or tingling in an arm or leg.  You have shortness of breath while active or at rest.  You have chest pain.  You have a rapid or irregular heartbeat.  You feel light-headed or dizzy.  You cough up blood.  You notice blood in your vomit, bowel movement, or urine. These symptoms may represent a serious problem that is an emergency. Do not wait to see if the symptoms will go away. Get medical help right away. Call your local emergency services (911 in the [place] Do not drive yourself to the hospital.   This information is not intended to replace advice given to you by your health care provider. Make sure you discuss any questions you have with your health care provider.   Document Released: 07/17/2009 Document Revised: 06/10/2015 Document Reviewed: 01/14/2015 Elsevier Interactive Patient Education [employer]

## 2016-02-11 NOTE — ED Notes (Signed)
Lump noted in right breast. Pt reports that she noticed about 3 days ago.

## 2016-02-11 NOTE — ED Notes (Signed)
Patient ambulatory to triage with steady gait, without difficulty or distress noted; pt reports painful knot to right breast noted 3 days ago

## 2016-02-24 ENCOUNTER — Inpatient Hospital Stay
Admission: EM | Admit: 2016-02-24 | Discharge: 2016-02-27 | DRG: 379 | Disposition: A | Discharge status: Home / Self Care | Payer: Self-pay | Attending: Internal Medicine | Admitting: Internal Medicine

## 2016-02-24 ENCOUNTER — Encounter: Payer: Self-pay | Admitting: Emergency Medicine

## 2016-02-24 DIAGNOSIS — K264 Chronic or unspecified duodenal ulcer with hemorrhage: Principal | ICD-10-CM | POA: Diagnosis present

## 2016-02-24 DIAGNOSIS — K259 Gastric ulcer, unspecified as acute or chronic, without hemorrhage or perforation: Secondary | ICD-10-CM | POA: Diagnosis present

## 2016-02-24 DIAGNOSIS — D649 Anemia, unspecified: Secondary | ICD-10-CM | POA: Diagnosis present

## 2016-02-24 DIAGNOSIS — T39395A Adverse effect of other nonsteroidal anti-inflammatory drugs [NSAID], initial encounter: Secondary | ICD-10-CM | POA: Diagnosis present

## 2016-02-24 DIAGNOSIS — K2970 Gastritis, unspecified, without bleeding: Secondary | ICD-10-CM | POA: Diagnosis present

## 2016-02-24 DIAGNOSIS — K922 Gastrointestinal hemorrhage, unspecified: Secondary | ICD-10-CM | POA: Insufficient documentation

## 2016-02-24 DIAGNOSIS — K449 Diaphragmatic hernia without obstruction or gangrene: Secondary | ICD-10-CM | POA: Diagnosis present

## 2016-02-24 DIAGNOSIS — K3189 Other diseases of stomach and duodenum: Secondary | ICD-10-CM

## 2016-02-24 DIAGNOSIS — D573 Sickle-cell trait: Secondary | ICD-10-CM | POA: Diagnosis present

## 2016-02-24 DIAGNOSIS — Z79899 Other long term (current) drug therapy: Secondary | ICD-10-CM

## 2016-02-24 DIAGNOSIS — D509 Iron deficiency anemia, unspecified: Secondary | ICD-10-CM | POA: Diagnosis present

## 2016-02-24 DIAGNOSIS — K269 Duodenal ulcer, unspecified as acute or chronic, without hemorrhage or perforation: Secondary | ICD-10-CM | POA: Insufficient documentation

## 2016-02-24 DIAGNOSIS — D500 Iron deficiency anemia secondary to blood loss (chronic): Secondary | ICD-10-CM | POA: Insufficient documentation

## 2016-02-24 DIAGNOSIS — Z79891 Long term (current) use of opiate analgesic: Secondary | ICD-10-CM

## 2016-02-24 LAB — CBC
HEMATOCRIT: 24.6 % — AB (ref 35.0–47.0)
HEMOGLOBIN: 7.1 g/dL — AB (ref 12.0–16.0)
MCH: 17.8 pg — AB (ref 26.0–34.0)
MCHC: 28.8 g/dL — ABNORMAL LOW (ref 32.0–36.0)
MCV: 62 fL — ABNORMAL LOW (ref 80.0–100.0)
PLATELETS: 416 10*3/uL (ref 150–440)
RBC: 3.96 MIL/uL (ref 3.80–5.20)
RDW: 22.3 % — AB (ref 11.5–14.5)
WBC: 12.7 10*3/uL — ABNORMAL HIGH (ref 3.6–11.0)

## 2016-02-24 LAB — COMPREHENSIVE METABOLIC PANEL
ALBUMIN: 3.7 g/dL (ref 3.5–5.0)
ALT: 10 U/L — ABNORMAL LOW (ref 14–54)
ANION GAP: 6 (ref 5–15)
AST: 17 U/L (ref 15–41)
Alkaline Phosphatase: 58 U/L (ref 38–126)
BILIRUBIN TOTAL: 0.2 mg/dL — AB (ref 0.3–1.2)
BUN: 14 mg/dL (ref 6–20)
CHLORIDE: 106 mmol/L (ref 101–111)
CO2: 26 mmol/L (ref 22–32)
Calcium: 9 mg/dL (ref 8.9–10.3)
Creatinine, Ser: 0.94 mg/dL (ref 0.44–1.00)
GFR calc Af Amer: >60 mL/min (ref >60)
GFR calc non Af Amer: >60 mL/min (ref >60)
GLUCOSE: 116 mg/dL — AB (ref 65–99)
POTASSIUM: 3.7 mmol/L (ref 3.5–5.1)
SODIUM: 138 mmol/L (ref 135–145)
Total Protein: 7.5 g/dL (ref 6.5–8.1)

## 2016-02-24 LAB — URINALYSIS COMPLETE WITH MICROSCOPIC (ARMC ONLY)
BILIRUBIN URINE: NEGATIVE
Bacteria, UA: NONE SEEN
Glucose, UA: NEGATIVE mg/dL
KETONES UR: NEGATIVE mg/dL
Nitrite: NEGATIVE
PH: 5 (ref 5.0–8.0)
Protein, ur: 30 mg/dL — AB
Specific Gravity, Urine: 1.024 (ref 1.005–1.030)

## 2016-02-24 LAB — POCT PREGNANCY, URINE: PREG TEST UR: NEGATIVE

## 2016-02-24 LAB — LIPASE, BLOOD: LIPASE: 39 U/L (ref 11–51)

## 2016-02-24 LAB — TROPONIN I: Troponin I: <0.03 ng/mL (ref <0.031)

## 2016-02-24 MED ORDER — PANTOPRAZOLE SODIUM 40 MG IV SOLR
80.0000 mg | Freq: Once | INTRAVENOUS | Status: DC
  Filled 2016-02-24: qty 80

## 2016-02-24 MED ORDER — SODIUM CHLORIDE 0.9 % IV SOLN
250.0000 mL | INTRAVENOUS | Status: DC | PRN
  Administered 2016-02-26: 14:00:00 via INTRAVENOUS

## 2016-02-24 MED ORDER — PANTOPRAZOLE SODIUM 40 MG IV SOLR
40.0000 mg | Freq: Once | INTRAVENOUS | Status: AC
  Administered 2016-02-24: 40 mg via INTRAVENOUS
  Filled 2016-02-24: qty 40

## 2016-02-24 MED ORDER — SODIUM CHLORIDE 0.9 % IV BOLUS (SEPSIS)
500.0000 mL | Freq: Once | INTRAVENOUS | Status: AC
  Administered 2016-02-24: 500 mL via INTRAVENOUS

## 2016-02-24 MED ORDER — FERROUS SULFATE 325 (65 FE) MG PO TABS
325.0000 mg | ORAL_TABLET | Freq: Every day | ORAL | Status: DC
  Administered 2016-02-25: 325 mg via ORAL
  Filled 2016-02-24: qty 1

## 2016-02-24 MED ORDER — ACETAMINOPHEN 325 MG PO TABS: 650.0000 mg | ORAL_TABLET | Freq: Four times a day (QID) | ORAL | Status: DC | PRN

## 2016-02-24 MED ORDER — SODIUM CHLORIDE 0.9% FLUSH: 3.0000 mL | INTRAVENOUS | Status: DC | PRN

## 2016-02-24 MED ORDER — SODIUM CHLORIDE 0.9 % IV SOLN
8.0000 mg/h | INTRAVENOUS | Status: DC
  Administered 2016-02-25 – 2016-02-26 (×3): 8 mg/h via INTRAVENOUS
  Filled 2016-02-24 (×5): qty 80

## 2016-02-24 MED ORDER — ONDANSETRON HCL 4 MG/2ML IJ SOLN: 4.0000 mg | Freq: Four times a day (QID) | INTRAMUSCULAR | Status: DC | PRN

## 2016-02-24 MED ORDER — ACETAMINOPHEN 650 MG RE SUPP: 650.0000 mg | Freq: Four times a day (QID) | RECTAL | Status: DC | PRN

## 2016-02-24 MED ORDER — SODIUM CHLORIDE 0.9% FLUSH
3.0000 mL | Freq: Two times a day (BID) | INTRAVENOUS | Status: DC
Start: 2016-02-24 — End: 2016-02-27
  Administered 2016-02-24 – 2016-02-27 (×4): 3 mL via INTRAVENOUS

## 2016-02-24 MED ORDER — PANTOPRAZOLE SODIUM 40 MG IV SOLR
40.0000 mg | Freq: Once | INTRAVENOUS | Status: AC
  Administered 2016-02-25: 40 mg via INTRAVENOUS
  Filled 2016-02-24: qty 40

## 2016-02-24 MED ORDER — DOCUSATE SODIUM 100 MG PO CAPS
100.0000 mg | ORAL_CAPSULE | Freq: Two times a day (BID) | ORAL | Status: DC
  Administered 2016-02-25 – 2016-02-26 (×4): 100 mg via ORAL
  Filled 2016-02-24 (×4): qty 1

## 2016-02-24 MED ORDER — PNEUMOCOCCAL VAC POLYVALENT 25 MCG/0.5ML IJ INJ: 0.5000 mL | INJECTION | INTRAMUSCULAR | Status: DC

## 2016-02-24 MED ORDER — ONDANSETRON HCL 4 MG PO TABS: 4.0000 mg | ORAL_TABLET | Freq: Four times a day (QID) | ORAL | Status: DC | PRN

## 2016-02-24 NOTE — ED Provider Notes (Addendum)
Time Seen: Approximately *2130 I have reviewed the triage notes  Chief Complaint: Abdominal Pain   History of Present Illness: Carolyn [name] is a 35 y.o. female who presents with some epigastric abdominal pain that occurred over the last 4 days. Patient states she also recently finished her menstrual period. She is describing some very dark brown stool over the last 4 days. She has some general lies fatigue and is occasionally felt lightheaded. She denies any lower abdominal pain. She states that she's been taking meloxicam for pain that she is receive from the prior evaluation here in emergency department. She states that she has a history of anemia and review of her records shows she has been on ferrous sulfate in the past. The patient denies any chest pain or shortness of breath. She states she has some pain that radiates to the middle of the back.   Past Medical History  Diagnosis Date  . Sickle cell trait ([place])     There are no active problems to display for this patient.   No past surgical history on file.  No past surgical history on file.  Current Outpatient Rx  Name  Route  Sig  Dispense  Refill  . ferrous sulfate 325 (65 FE) MG tablet   Oral   Take 325 mg by mouth daily.         . meloxicam (MOBIC) 15 MG tablet   Oral   Take 1 tablet (15 mg total) by mouth daily.   30 tablet   0   . HYDROcodone-acetaminophen (NORCO) 5-325 MG tablet   Oral   Take 1-2 tablets by mouth every 4 (four) hours as needed for moderate pain. Patient not taking: Reported on 02/24/2016   10 tablet   0     Allergies:  Review of patient's allergies indicates no known allergies.  Family History: No family history on file.  Social History: Social History  Substance Use Topics  . Smoking status: Current Every Day Smoker -- 0.50 packs/day    Types: Cigarettes  . Smokeless tobacco: None  . Alcohol Use: Yes     Review of Systems:   10 point review of systems was performed and  was otherwise negative:  Constitutional: No fever Eyes: No visual disturbances ENT: No sore throat, ear pain Cardiac: No chest pain Respiratory: No shortness of breath, wheezing, or stridor Abdomen: Epigastric pain with nausea, no vomiting, No diarrhea Endocrine: No weight loss, No night sweats Extremities: No peripheral edema, cyanosis Skin: No rashes, easy bruising Neurologic: No focal weakness, trouble with speech or swollowing Urologic: No dysuria, Hematuria, or urinary frequency   Physical Exam:  ED Triage Vitals  Enc Vitals Group     BP 02/24/16 1940 128/80 mmHg     Pulse Rate 02/24/16 1940 84     Resp 02/24/16 1940 18     Temp 02/24/16 1940 97.6 F (36.4 C)     Temp Source 02/24/16 1940 Oral     SpO2 02/24/16 1940 100 %     Weight 02/24/16 1940 180 lb (81.647 kg)     Height 02/24/16 1940 5\' 9"  (1.753 m)     Head Cir --      Peak Flow --      Pain Score 02/24/16 1942 10     Pain Loc --      Pain Edu? --      Excl. in [place]? --     General: Awake , Alert , and Oriented times  3; GCS 15 Head: Normal cephalic , atraumatic Eyes: Pupils equal , round, reactive to light Nose/Throat: No nasal drainage, patent upper airway without erythema or exudate.  Neck: Supple, Full range of motion, No anterior adenopathy or palpable thyroid masses Lungs: Clear to ascultation without wheezes , rhonchi, or rales Heart: Regular rate, regular rhythm without murmurs , gallops , or rubs Abdomen: Soft, Tender to deep palpation primarily across the epigastric region, no rebound, guarding , or rigidity; bowel sounds positive and symmetric in all 4 quadrants. No organomegaly .        Extremities: 2 plus symmetric pulses. No edema, clubbing or cyanosis Neurologic: normal ambulation, Motor symmetric without deficits, sensory intact Skin: warm, dry, no rashes Rectal exam with chaperone present shows guaiac positive stool with normal sphincter tone. No visible external or palpable internal  hemorrhoids or masses.  Labs:   All laboratory work was reviewed including any pertinent negatives or positives listed below:  Labs Reviewed  COMPREHENSIVE METABOLIC PANEL - Abnormal; Notable for the following:    Glucose, Bld 116 (*)    ALT 10 (*)    Total Bilirubin 0.2 (*)    All other components within normal limits  CBC - Abnormal; Notable for the following:    WBC 12.7 (*)    Hemoglobin 7.1 (*)    HCT 24.6 (*)    MCV 62.0 (*)    MCH 17.8 (*)    MCHC 28.8 (*)    RDW 22.3 (*)    All other components within normal limits  URINALYSIS COMPLETEWITH MICROSCOPIC ([facility] ONLY) - Abnormal; Notable for the following:    Color, Urine YELLOW (*)    APPearance CLOUDY (*)    Hgb urine dipstick 3+ (*)    Protein, ur 30 (*)    Leukocytes, UA TRACE (*)    Squamous Epithelial / LPF 6-30 (*)    All other components within normal limits  LIPASE, BLOOD  TROPONIN I  POC URINE PREG, ED  POCT PREGNANCY, URINE  TYPE AND SCREEN  Review laboratory work shows significant anemia with a hemoglobin of 7.1, slight drop from previous hemoglobin of 7.7    Critical Care:  CRITICAL CARE Performed by: Daymon Larsen   Total critical care time: 31 minutes  Critical care time was exclusive of separately billable procedures and treating other patients.  Critical care was necessary to treat or prevent imminent or life-threatening deterioration.  Critical care was time spent personally by me on the following activities: development of treatment plan with patient and/or surrogate as well as nursing, discussions with consultants, evaluation of patient's response to treatment, examination of patient, obtaining history from patient or surrogate, ordering and performing treatments and interventions, ordering and review of laboratory studies, ordering and review of radiographic studies, pulse oximetry and re-evaluation of patient's condition. Management of upper gastrointestinal bleed   ED Course:  The  patient is currently hemodynamically stable and since she has a history of iron deficiency anemia, the patient was typed and screened but was not initiated on transfusion. Thus far her heart rate and her blood pressure have remained stable. Does describe some symptoms consistent with anemia with guaiac positive stool and a recent menstrual period would explain most of the drop in her hemoglobin at this time. A shunt was given normal saline bolus and was given a bolus of Protonix IV and was also started on a Protonix drip.    Assessment:  Upper gastrointestinal bleed Anemia   Final Clinical Impression:  Final diagnoses:  GI bleed due to NSAIDs     Plan:  Inpatient management     Dictation is complete       Daymon Larsen, MD 02/24/16 [id number]  Daymon Larsen, MD 03/14/16 [phone]

## 2016-02-24 NOTE — H&P (Signed)
[place] [facility] [place] NAME: Carolyn [name]    MR#:  [id number]  DATE OF BIRTH:  06/01/1981  DATE OF ADMISSION:  02/24/2016  PRIMARY CARE PHYSICIAN: [facility] Department   REQUESTING/REFERRING PHYSICIAN: dr Marcelene Butte  CHIEF COMPLAINT:  Epigastric abdominal pain  HISTORY OF PRESENT ILLNESS:  Carolyn [name]  is a 35 y.o. female with a known history of Chronic anemia who was on iron pills comes to the emergency room after she started having some epigastric discomfort for last 3 days. Patient was seen recently in the emergency room for some pain over her chest and was prescribed meloxicam. She started having discomfort and some nausea after taking the last meloxicam. She reports having some dark stools however denies any blood clots or black tarry stools. She was tested guaiac positive in the emergency room. Her hemoglobin is 7.1. She denies any bright red blood per rectum or any hematemesis she is being admitted for anemia which appears to be chronic. Patient states she has been followed at [place] for her anemia however she has not been taking her iron pills as she is supposed to. She has normal menstrual cycles. She has not had any workup for anemia in the past  PAST MEDICAL HISTORY:   Past Medical History  Diagnosis Date  . Sickle cell trait ([place])     PAST SURGICAL HISTOIRY:  No past surgical history on file.  SOCIAL HISTORY:   Social History  Substance Use Topics  . Smoking status: Current Every Day Smoker -- 0.50 packs/day    Types: Cigarettes  . Smokeless tobacco: Not on file  . Alcohol Use: Yes    FAMILY HISTORY:  Lung cancer .  DRUG ALLERGIES:  No Known Allergies  REVIEW OF SYSTEMS:  Review of Systems  Constitutional: Negative for fever, chills and weight loss.  HENT: Negative for ear discharge, ear pain and nosebleeds.   Eyes: Negative for blurred vision, pain and discharge.   Respiratory: Negative for sputum production, shortness of breath, wheezing and stridor.   Cardiovascular: Negative for chest pain, palpitations, orthopnea and PND.  Gastrointestinal: Positive for nausea, abdominal pain and melena. Negative for vomiting and diarrhea.  Genitourinary: Negative for urgency and frequency.  Musculoskeletal: Negative for back pain and joint pain.  Neurological: Positive for weakness. Negative for sensory change, speech change and focal weakness.  Psychiatric/Behavioral: Negative for depression and hallucinations. The patient is not nervous/anxious.   All other systems reviewed and are negative.    MEDICATIONS AT HOME:   Prior to Admission medications   Medication Sig Start Date End Date Taking? Authorizing Provider  ferrous sulfate 325 (65 FE) MG tablet Take 325 mg by mouth daily.   Yes Historical Provider, MD  meloxicam (MOBIC) 15 MG tablet Take 1 tablet (15 mg total) by mouth daily. 02/11/16  Yes Pierce Crane Beers, PA-C  HYDROcodone-acetaminophen (NORCO) 5-325 MG tablet Take 1-2 tablets by mouth every 4 (four) hours as needed for moderate pain. Patient not taking: Reported on 02/24/2016 02/11/16   Arlyss Repress, PA-C      VITAL SIGNS:  Blood pressure 128/80, pulse 84, temperature 97.6 F (36.4 C), temperature source Oral, resp. rate 18, height 5\' 9"  (1.753 m), weight 81.647 kg (180 lb), last menstrual period 02/23/2016, SpO2 100 %.  PHYSICAL EXAMINATION:  GENERAL:  35 y.o.-year-old patient lying in the bed with no acute distress. Pallor+ EYES: Pupils equal, round, reactive to light and accommodation. No  scleral icterus. Extraocular muscles intact.  HEENT: Head atraumatic, normocephalic. Oropharynx and nasopharynx clear.  NECK:  Supple, no jugular venous distention. No thyroid enlargement, no tenderness.  LUNGS: Normal breath sounds bilaterally, no wheezing, rales,rhonchi or crepitation. No use of accessory muscles of respiration.  CARDIOVASCULAR: S1, S2  normal. No murmurs, rubs, or gallops.  ABDOMEN: Soft, nontender, nondistended. Bowel sounds present. No organomegaly or mass.  EXTREMITIES: No pedal edema, cyanosis, or clubbing.  NEUROLOGIC: Cranial nerves II through XII are intact. Muscle strength 5/5 in all extremities. Sensation intact. Gait not checked.  PSYCHIATRIC: The patient is alert and oriented x 3.  SKIN: No obvious rash, lesion, or ulcer.   LABORATORY PANEL:   CBC  Recent Labs Lab 02/24/16 1940  WBC 12.7*  HGB 7.1*  HCT 24.6*  PLT 416   ------------------------------------------------------------------------------------------------------------------  Chemistries   Recent Labs Lab 02/24/16 1940  NA 138  K 3.7  CL 106  CO2 26  GLUCOSE 116*  BUN 14  CREATININE 0.94  CALCIUM 9.0  AST 17  ALT 10*  ALKPHOS 58  BILITOT 0.2*   ------------------------------------------------------------------------------------------------------------------  Cardiac Enzymes  Recent Labs Lab 02/24/16 1940  TROPONINI <0.03    IMPRESSION AND PLAN:  Carolyn [name]  is a 35 y.o. female with a known history of Chronic anemia who was on iron pills comes to the emergency room after she started having some epigastric discomfort for last 3 days. Patient was seen recently in the emergency room for some pain over her chest and was prescribed meloxicam. She started having discomfort and some nausea after taking the last meloxicam. She reports having some dark stools however denies any blood clots or black tarry stools. She was tested guaiac positive in the emergency room. Her hemoglobin is 7.1.   1. Anemia appears chronic iron deficiency/microcytic anemia -Patient has known history of chronic anemia followed at [place]. She was on ferrous sulfate which she stopped taking for a few months -Hemoglobin was 7.7 a few days ago down to 7.1. She is guaiac positive -Recently started on meloxicam and now has epigastric  pain -IV Protonix drip -Follow up CBC and hemoglobin drops less than 7 consider transfusion risk and benefits explained patient agreeable for transfusion if needed -GI consult -Iron studies, B12, folate -Patient will benefit from outpatient hematology consultation and treatment for anemia  2. Epigastric pain suspected gastritis -Continue IV protonic strip -GI consultation  3. Tobacco abuse. Patient advised smoking cessation about 4 minutes spent.  4. DVT prophylaxis with SCD and teds  All the records are reviewed and case discussed with ED provider. Management plans discussed with the patient, family and they are in agreement.  CODE STATUS: full  TOTAL TIME TAKING CARE OF THIS PATIENT87minutes.    Carolyn [name] M.D on 02/24/2016 at 10:42 PM  Between 7am to 6pm - Pager - [phone]  After 6pm go to [email or web address] - password [email or web address] [facility]  [place] [facility]  Office  [phone]  CC: Primary care physician; [facility] Department

## 2016-02-24 NOTE — ED Notes (Addendum)
Patient ambulatory to triage with steady gait, without difficulty or distress noted; pt reports rx meloxicam on 5/17 and not feeling well since; with upper abd pain and dark stools with HA

## 2016-02-24 NOTE — ED Notes (Signed)
Patient states she was placed on Meloxicam on 02/17/16 and since then she has been having sharp upper abdominal pain and black stools.  Patient tender upon palpation.

## 2016-02-24 NOTE — ED Notes (Addendum)
Pt transported to room 214

## 2016-02-25 DIAGNOSIS — T39395A Adverse effect of other nonsteroidal anti-inflammatory drugs [NSAID], initial encounter: Secondary | ICD-10-CM | POA: Insufficient documentation

## 2016-02-25 DIAGNOSIS — K922 Gastrointestinal hemorrhage, unspecified: Secondary | ICD-10-CM | POA: Insufficient documentation

## 2016-02-25 LAB — IRON AND TIBC
IRON: 5 ug/dL — AB (ref 28–170)
Saturation Ratios: 1 % — ABNORMAL LOW (ref 10.4–31.8)
TIBC: 443 ug/dL (ref 250–450)
UIBC: 438 ug/dL

## 2016-02-25 LAB — VITAMIN B12: Vitamin B-12: 272 pg/mL (ref 180–914)

## 2016-02-25 LAB — ABO/RH: ABO/RH(D): A POS

## 2016-02-25 LAB — PREPARE RBC (CROSSMATCH)

## 2016-02-25 LAB — HEMOGLOBIN: HEMOGLOBIN: 6.6 g/dL — AB (ref 12.0–16.0)

## 2016-02-25 LAB — FOLATE: Folate: 12.2 ng/mL (ref >5.9)

## 2016-02-25 MED ORDER — SODIUM CHLORIDE 0.9 % IV SOLN
Freq: Once | INTRAVENOUS | Status: AC
  Administered 2016-02-25: 15:00:00 via INTRAVENOUS

## 2016-02-25 NOTE — Consult Note (Signed)
Carolyn [name]  [address], [place], [place] [address] Phone: [phone] Fax : [phone]  Consultation  Referring Provider:     No ref. provider found Primary Care Physician:  [facility] Department Primary Gastroenterologist:           Reason for Consultation:     Melena  Date of Admission:  02/24/2016 Date of Consultation:  02/25/2016         HPI:   Carolyn [name] is a 35 y.o. female who reports that she has been on meloxicam and started having dark stools 3 days ago. The patient was admitted found to have a low blood count. The patient also was found to have heme positive stools. The patient states that her abdominal pain is in the epigastric area. She is never had any bright red blood per rectum or vomiting any blood. The patient is being transfused at the present time for her low hemoglobin. The patient also reports that she had a hamburger for lunch and a breakfast this morning. There is no report of any family history of colon cancer colon polyps. The patient also denies any unexplained weight loss.  Past Medical History  Diagnosis Date  . Sickle cell trait ([place])     No past surgical history on file.  Prior to Admission medications   Medication Sig Start Date End Date Taking? Authorizing Provider  ferrous sulfate 325 (65 FE) MG tablet Take 325 mg by mouth daily.   Yes Historical Provider, MD  meloxicam (MOBIC) 15 MG tablet Take 1 tablet (15 mg total) by mouth daily. 02/11/16  Yes Pierce Crane Beers, PA-C  HYDROcodone-acetaminophen (NORCO) 5-325 MG tablet Take 1-2 tablets by mouth every 4 (four) hours as needed for moderate pain. Patient not taking: Reported on 02/24/2016 02/11/16   Arlyss Repress, PA-C    No family history on file.   Social History  Substance Use Topics  . Smoking status: Current Every Day Smoker -- 0.50 packs/day    Types: Cigarettes  . Smokeless tobacco: None  . Alcohol Use: Yes    Allergies as of 02/24/2016  . (No  Known Allergies)    Review of Systems:    All systems reviewed and negative except where noted in HPI.   Physical Exam:  Vital signs in last 24 hours: Temp:  [97.6 F (36.4 C)-98.7 F (37.1 C)] 98.7 F (37.1 C) (05/25 1456) Pulse Rate:  [75-89] 89 (05/25 1456) Resp:  [18-20] 18 (05/25 1422) BP: (105-128)/(69-80) 109/70 mmHg (05/25 1456) SpO2:  [98 %-100 %] 100 % (05/25 1456) Weight:  [176 lb 6.4 oz (80.015 kg)-180 lb (81.647 kg)] 176 lb 6.4 oz (80.015 kg) (05/24 2327) Last BM Date: 02/24/16 General:   Pleasant, cooperative in NAD Head:  Normocephalic and atraumatic. Eyes:   No icterus.   Conjunctiva pink. PERRLA. Ears:  Normal auditory acuity. Neck:  Supple; no masses or thyroidomegaly Lungs: Respirations even and unlabored. Lungs clear to auscultation bilaterally.   No wheezes, crackles, or rhonchi.  Heart:  Regular rate and rhythm;  Without murmur, clicks, rubs or gallops Abdomen:  Soft, nondistended, nontender. Normal bowel sounds. No appreciable masses or hepatomegaly.  No rebound or guarding.  Rectal:  Not performed. Msk:  Symmetrical without gross deformities.    Extremities:  Without edema, cyanosis or clubbing. Neurologic:  Alert and oriented x3;  grossly normal neurologically. Skin:  Intact without significant lesions or rashes. Cervical Nodes:  No significant cervical adenopathy. Psych:  Alert and cooperative.  Normal affect.  LAB RESULTS:  Recent Labs  02/24/16 1940 02/25/16 1037  WBC 12.7*  --   HGB 7.1* 6.6*  HCT 24.6*  --   PLT 416  --    BMET  Recent Labs  02/24/16 1940  NA 138  K 3.7  CL 106  CO2 26  GLUCOSE 116*  BUN 14  CREATININE 0.94  CALCIUM 9.0   LFT  Recent Labs  02/24/16 1940  PROT 7.5  ALBUMIN 3.7  AST 17  ALT 10*  ALKPHOS 58  BILITOT 0.2*   PT/INR No results for input(s): LABPROT, INR in the last 72 hours.  STUDIES: No results found.    Impression / Plan:   Carolyn [name] is a 35 y.o. y/o female with recent  meloxicam use and black stools for the last few days. The patient likely has a upper GI bleed. The patient will need an upper endoscopy but due to her eating today she cannot be set up for a procedure today. The patient will be made nothing by mouth after midnight and will have her upper endoscopy set up for tomorrow. I have discussed risks & benefits which include, but are not limited to, bleeding, infection, perforation & drug reaction.  The patient agrees with this plan & written consent will be obtained.     Thank you for involving me in the care of this patient.      LOS: 1 day   Lucilla Lame, MD  02/25/2016, 4:37 PM   Note: This dictation was prepared with Dragon dictation along with smaller phrase technology. Any transcriptional errors that result from this process are unintentional.

## 2016-02-25 NOTE — Progress Notes (Signed)
Dr. Estanislado Pandy called regarding Hgb 7.1, asked if transfusion would be intiated.  Not wanting to at this time. Continue to watch Hgb trend. Barbaraann Faster, RN 2:59 AM 02/25/2016

## 2016-02-25 NOTE — Care Management (Signed)
Trending hemoglobins.  Patient at present has declined transfusion with last hgb of 7.1.

## 2016-02-26 ENCOUNTER — Encounter
Admission: EM | Disposition: A | Discharge status: Home / Self Care | Payer: Self-pay | Source: Home / Self Care | Attending: Internal Medicine

## 2016-02-26 ENCOUNTER — Inpatient Hospital Stay: Payer: MEDICAID | Admitting: Anesthesiology

## 2016-02-26 ENCOUNTER — Encounter: Payer: Self-pay | Admitting: Anesthesiology

## 2016-02-26 DIAGNOSIS — K259 Gastric ulcer, unspecified as acute or chronic, without hemorrhage or perforation: Secondary | ICD-10-CM | POA: Insufficient documentation

## 2016-02-26 DIAGNOSIS — D500 Iron deficiency anemia secondary to blood loss (chronic): Secondary | ICD-10-CM | POA: Insufficient documentation

## 2016-02-26 DIAGNOSIS — K3189 Other diseases of stomach and duodenum: Secondary | ICD-10-CM

## 2016-02-26 DIAGNOSIS — K269 Duodenal ulcer, unspecified as acute or chronic, without hemorrhage or perforation: Secondary | ICD-10-CM | POA: Insufficient documentation

## 2016-02-26 DIAGNOSIS — K253 Acute gastric ulcer without hemorrhage or perforation: Secondary | ICD-10-CM

## 2016-02-26 DIAGNOSIS — D509 Iron deficiency anemia, unspecified: Secondary | ICD-10-CM

## 2016-02-26 HISTORY — PX: ESOPHAGOGASTRODUODENOSCOPY (EGD) WITH PROPOFOL: SHX5813

## 2016-02-26 LAB — CBC
HEMATOCRIT: 23.7 % — AB (ref 35.0–47.0)
HEMOGLOBIN: 7.2 g/dL — AB (ref 12.0–16.0)
MCH: 19.6 pg — AB (ref 26.0–34.0)
MCHC: 30.6 g/dL — ABNORMAL LOW (ref 32.0–36.0)
MCV: 64 fL — AB (ref 80.0–100.0)
PLATELETS: 364 10*3/uL (ref 150–440)
RBC: 3.7 MIL/uL — AB (ref 3.80–5.20)
RDW: 24.9 % — ABNORMAL HIGH (ref 11.5–14.5)
WBC: 12.5 10*3/uL — AB (ref 3.6–11.0)

## 2016-02-26 LAB — BASIC METABOLIC PANEL WITH GFR
Anion gap: 7 (ref 5–15)
BUN: 11 mg/dL (ref 6–20)
CO2: 24 mmol/L (ref 22–32)
Calcium: 8.5 mg/dL — ABNORMAL LOW (ref 8.9–10.3)
Chloride: 109 mmol/L (ref 101–111)
Creatinine, Ser: 0.91 mg/dL (ref 0.44–1.00)
GFR calc Af Amer: >60 mL/min
GFR calc non Af Amer: >60 mL/min
Glucose, Bld: 90 mg/dL (ref 65–99)
Potassium: 3.9 mmol/L (ref 3.5–5.1)
Sodium: 140 mmol/L (ref 135–145)

## 2016-02-26 LAB — TYPE AND SCREEN
ABO/RH(D): A POS
Antibody Screen: NEGATIVE
Unit division: 0

## 2016-02-26 SURGERY — ESOPHAGOGASTRODUODENOSCOPY (EGD) WITH PROPOFOL
Anesthesia: General

## 2016-02-26 MED ORDER — PROPOFOL 10 MG/ML IV BOLUS
INTRAVENOUS | Status: DC | PRN
  Administered 2016-02-26: 30 mg via INTRAVENOUS
  Administered 2016-02-26: 70 mg via INTRAVENOUS
  Administered 2016-02-26: 50 mg via INTRAVENOUS

## 2016-02-26 MED ORDER — SODIUM CHLORIDE 0.9 % IV SOLN: INTRAVENOUS | Status: DC

## 2016-02-26 MED ORDER — IPRATROPIUM-ALBUTEROL 0.5-2.5 (3) MG/3ML IN SOLN
3.0000 mL | Freq: Once | RESPIRATORY_TRACT | Status: AC
  Administered 2016-02-26: 3 mL via RESPIRATORY_TRACT

## 2016-02-26 MED ORDER — LIDOCAINE 2% (20 MG/ML) 5 ML SYRINGE
INTRAMUSCULAR | Status: DC | PRN
  Administered 2016-02-26: 50 mg via INTRAVENOUS

## 2016-02-26 MED ORDER — SODIUM CHLORIDE 0.9 % IV SOLN
INTRAVENOUS | Status: DC
  Administered 2016-02-26: 1000 mL via INTRAVENOUS

## 2016-02-26 MED ORDER — PANTOPRAZOLE SODIUM 40 MG PO TBEC
40.0000 mg | DELAYED_RELEASE_TABLET | Freq: Two times a day (BID) | ORAL | Status: DC
  Administered 2016-02-26: 40 mg via ORAL
  Filled 2016-02-26: qty 1

## 2016-02-26 MED ORDER — GLYCOPYRROLATE 0.2 MG/ML IJ SOLN
INTRAMUSCULAR | Status: DC | PRN
  Administered 2016-02-26: 0.2 mg via INTRAVENOUS

## 2016-02-26 MED ORDER — PROPOFOL 500 MG/50ML IV EMUL
INTRAVENOUS | Status: DC | PRN
Start: 2016-02-26 — End: 2016-02-26
  Administered 2016-02-26: 120 ug/kg/min via INTRAVENOUS

## 2016-02-26 MED ORDER — IPRATROPIUM-ALBUTEROL 0.5-2.5 (3) MG/3ML IN SOLN
RESPIRATORY_TRACT | Status: AC
  Administered 2016-02-26: 3 mL
  Filled 2016-02-26: qty 3

## 2016-02-26 NOTE — Progress Notes (Signed)
[place] at [facility]                                                                                                                                                                                            Patient Demographics   Carolyn [name], is a 35 y.o. female, DOB - 01/17/1981, [id number]  Admit date - 02/24/2016   Admitting Physician Fritzi Mandes, MD  Outpatient Primary MD for the patient is [facility] Department   LOS - 2  Subjective: Came with dark stool, s/p EGD- duodenal ulcers found, no active bleed, received PRBC transfusion yesterday, no more bleeding.  Review of Systems:   CONSTITUTIONAL: No documented fever. No fatigue, weakness. No weight gain, no weight loss.  EYES: No blurry or double vision.  ENT: No tinnitus. No postnasal drip. No redness of the oropharynx.  RESPIRATORY: No cough, no wheeze, no hemoptysis. No dyspnea.  CARDIOVASCULAR: No chest pain. No orthopnea. No palpitations. No syncope.  GASTROINTESTINAL: No nausea, no vomiting or diarrhea. positive abdominal pain. No melena or hematochezia.  GENITOURINARY: No dysuria or hematuria.  ENDOCRINE: No polyuria or nocturia. No heat or cold intolerance.  HEMATOLOGY: No anemia. No bruising. No bleeding.  INTEGUMENTARY: No rashes. No lesions.  MUSCULOSKELETAL: No arthritis. No swelling. No gout.  NEUROLOGIC: No numbness, tingling, or ataxia. No seizure-type activity.  PSYCHIATRIC: No anxiety. No insomnia. No ADD.    Vitals:   Filed Vitals:   02/26/16 1445 02/26/16 1455 02/26/16 1535 02/26/16 2015  BP: 116/79 117/80 120/72 118/69  Pulse: 86 85 72 88  Temp:   98.3 F (36.8 C) 97.7 F (36.5 C)  TempSrc:   Oral Oral  Resp: 14 15 17 20   Height:      Weight:      SpO2: 100% 100% 100% 100%    Wt Readings from Last 3 Encounters:  02/24/16 80.015 kg (176 lb 6.4 oz)  02/11/16 81.647 kg (180 lb)  09/15/15 79.379 kg (175 lb)     Intake/Output Summary (Last  24 hours) at 02/26/16 2140 Last data filed at 02/26/16 1700  Gross per 24 hour  Intake  888.9 ml  Output      0 ml  Net  888.9 ml    Physical Exam:   GENERAL: Pleasant-appearing in no apparent distress.  HEAD, EYES, EARS, NOSE AND THROAT: Atraumatic, normocephalic. Extraocular muscles are intact. Pupils equal and reactive to light. Sclerae anicteric. No conjunctival injection. No oro-pharyngeal erythema.  NECK: Supple. There is no jugular venous distention. No bruits, no lymphadenopathy, no thyromegaly.  HEART: Regular rate and rhythm,. No murmurs, no  rubs, no clicks.  LUNGS: Clear to auscultation bilaterally. No rales or rhonchi. No wheezes.  ABDOMEN: Soft, flat, epigastric ttenderness, nondistended. Has good bowel sounds. No hepatosplenomegaly appreciated.  EXTREMITIES: No evidence of any cyanosis, clubbing, or peripheral edema.  +2 pedal and radial pulses bilaterally.  NEUROLOGIC: The patient is alert, awake, and oriented x3 with no focal motor or sensory deficits appreciated bilaterally.  SKIN: Moist and warm with no rashes appreciated.  Psych: Not anxious, depressed LN: No inguinal LN enlargement    Antibiotics   Anti-infectives    None      Medications   Scheduled Meds: . docusate sodium  100 mg Oral BID  . ferrous sulfate  325 mg Oral Daily  . pantoprazole  40 mg Oral BID  . sodium chloride flush  3 mL Intravenous Q12H   Continuous Infusions:   PRN Meds:.sodium chloride, acetaminophen **OR** acetaminophen, ondansetron **OR** ondansetron (ZOFRAN) IV, sodium chloride flush   Data Review:   Micro Results No results found for this or any previous visit (from the past 240 hour(s)).  Radiology Reports US Breast Ltd [facility]  02/11/2016  CLINICAL DATA:  35 year old female with lump in the right breast. Evaluate for abscess. EXAM: Limited ULTRASOUND OF THE right BREAST COMPARISON:  None FINDINGS: This is a limited evaluation of the right breast stop for  possible abscess. This is not a screening study. Targeted sonographic images of the right breast in the region of the palpable concern was performed. No abscess or fluid collection identified. A linear hypoechoic tubular and cord-like structure in the superficial soft tissues of the right breast at approximately 10 o'clock position and 4 cm from the nipple noted. 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 Doppler images do not demonstrate flow within the lumen of this structure. This is most compatible with a thrombosed subcutaneous vein of the breast and anterior chest wall. IMPRESSION: No fluid collection or abscess. Thrombosed superficial vein of the right breast and anterior chest wall. Electronically Signed   By: Anner Crete M.D.   On: 02/11/2016 22:02     CBC  Recent Labs Lab 02/24/16 1940 02/25/16 1037 02/26/16 0418  WBC 12.7*  --  12.5*  HGB 7.1* 6.6* 7.2*  HCT 24.6*  --  23.7*  PLT 416  --  364  MCV 62.0*  --  64.0*  MCH 17.8*  --  19.6*  MCHC 28.8*  --  30.6*  RDW 22.3*  --  24.9*    Chemistries   Recent Labs Lab 02/24/16 1940 02/26/16 0418  NA 138 140  K 3.7 3.9  CL 106 109  CO2 26 24  GLUCOSE 116* 90  BUN 14 11  CREATININE 0.94 0.91  CALCIUM 9.0 8.5*  AST 17  --   ALT 10*  --   ALKPHOS 58  --   BILITOT 0.2*  --    ------------------------------------------------------------------------------------------------------------------ estimated creatinine clearance is 100.5 mL/min (by C-G formula based on Cr of 0.91). ------------------------------------------------------------------------------------------------------------------ No results for input(s): HGBA1C in the last 72 hours. ------------------------------------------------------------------------------------------------------------------ No results for input(s): CHOL, HDL, LDLCALC, TRIG, CHOLHDL, LDLDIRECT in the  last 72 hours. ------------------------------------------------------------------------------------------------------------------ No results for input(s): TSH, T4TOTAL, T3FREE, THYROIDAB in the last 72 hours.  Invalid input(s): FREET3 ------------------------------------------------------------------------------------------------------------------  Recent Labs  02/24/16 2300  VITAMINB12 272  FOLATE 12.2  TIBC 443  IRON 5*    Coagulation profile No results for input(s): INR, PROTIME  in the last 168 hours.  No results for input(s): DDIMER in the last 72 hours.  Cardiac Enzymes  Recent Labs Lab 02/24/16 1940  TROPONINI <0.03   ------------------------------------------------------------------------------------------------------------------ Invalid input(s): POCBNP    Assessment & Plan   Keiasha Crull is a 35 y.o. female with a known history of Chronic anemia who was on iron pills comes to the emergency room after she started having some epigastric discomfort for last 3 days. Patient was seen recently in the emergency room for some pain over her chest and was prescribed meloxicam. She started having discomfort and some nausea after taking the last meloxicam. She reports having some dark stools however denies any blood clots or black tarry stools. She was tested guaiac positive in the emergency room.  1. Anemia appears chronic iron deficiency/microcytic anemia- had GI bleed- dark stool.    Given PRBC transfusion    Hb stable for now, monitor.     2. Epigastric pain suspected gastritis -given IV protonic drip, now switch to oral PPI BID. -appreciated GI eval- EGD done- Found Duodenal Ulcers.  3. Tobacco abuse. Patient advised smoking cessation  For 4 min.  4. DVT prophylaxis with SCD and teds     Code Status Orders        Start     Ordered   02/24/16 2329  Full code   Continuous     02/24/16 2329    Code Status History    Date Active Date Inactive Code Status  Order ID Comments User Context   This patient has a current code status but no historical code status.       Consults  gi  DVT Prophylaxis   SCD  Lab Results  Component Value Date   PLT 364 02/26/2016     Time Spent in minutes  73min  Greater than 50% of time spent in care coordination and counseling patient regarding the condition and plan of care.   Vaughan Basta M.D on 02/26/2016 at 9:40 PM  Between 7am to 6pm - Pager - [phone]  After 6pm go to [email or web address] - password [email or web address] [place] [facility]   Office  [phone]

## 2016-02-26 NOTE — Progress Notes (Signed)
[place] at [facility]                                                                                                                                                                                            Patient Demographics   Carolyn [name], is a 35 y.o. female, DOB - 1981-05-23, [id number]  Admit date - 02/24/2016   Admitting Physician Fritzi Mandes, MD  Outpatient Primary MD for the patient is [facility] Department   LOS - 2  Subjective: pt still has epigastric pain, no cp or sob     Review of Systems:   CONSTITUTIONAL: No documented fever. No fatigue, weakness. No weight gain, no weight loss.  EYES: No blurry or double vision.  ENT: No tinnitus. No postnasal drip. No redness of the oropharynx.  RESPIRATORY: No cough, no wheeze, no hemoptysis. No dyspnea.  CARDIOVASCULAR: No chest pain. No orthopnea. No palpitations. No syncope.  GASTROINTESTINAL: No nausea, no vomiting or diarrhea. positive abdominal pain. No melena or hematochezia.  GENITOURINARY: No dysuria or hematuria.  ENDOCRINE: No polyuria or nocturia. No heat or cold intolerance.  HEMATOLOGY: No anemia. No bruising. No bleeding.  INTEGUMENTARY: No rashes. No lesions.  MUSCULOSKELETAL: No arthritis. No swelling. No gout.  NEUROLOGIC: No numbness, tingling, or ataxia. No seizure-type activity.  PSYCHIATRIC: No anxiety. No insomnia. No ADD.    Vitals:   Filed Vitals:   02/26/16 1435 02/26/16 1445 02/26/16 1455 02/26/16 1535  BP: 111/77 116/79 117/80 120/72  Pulse: 99 86 85 72  Temp:    98.3 F (36.8 C)  TempSrc:    Oral  Resp: 25 14 15 17   Height:      Weight:      SpO2: 100% 100% 100% 100%    Wt Readings from Last 3 Encounters:  02/24/16 80.015 kg (176 lb 6.4 oz)  02/11/16 81.647 kg (180 lb)  09/15/15 79.379 kg (175 lb)     Intake/Output Summary (Last 24 hours) at 02/26/16 1556 Last data filed at 02/26/16 1420  Gross per 24 hour  Intake 1261.9  ml  Output      0 ml  Net 1261.9 ml    Physical Exam:   GENERAL: Pleasant-appearing in no apparent distress.  HEAD, EYES, EARS, NOSE AND THROAT: Atraumatic, normocephalic. Extraocular muscles are intact. Pupils equal and reactive to light. Sclerae anicteric. No conjunctival injection. No oro-pharyngeal erythema.  NECK: Supple. There is no jugular venous distention. No bruits, no lymphadenopathy, no thyromegaly.  HEART: Regular rate and rhythm,. No murmurs, no rubs, no clicks.  LUNGS: Clear to auscultation bilaterally. No rales or  rhonchi. No wheezes.  ABDOMEN: Soft, flat, epigastric ttenderness, nondistended. Has good bowel sounds. No hepatosplenomegaly appreciated.  EXTREMITIES: No evidence of any cyanosis, clubbing, or peripheral edema.  +2 pedal and radial pulses bilaterally.  NEUROLOGIC: The patient is alert, awake, and oriented x3 with no focal motor or sensory deficits appreciated bilaterally.  SKIN: Moist and warm with no rashes appreciated.  Psych: Not anxious, depressed LN: No inguinal LN enlargement    Antibiotics   Anti-infectives    None      Medications   Scheduled Meds: . docusate sodium  100 mg Oral BID  . ferrous sulfate  325 mg Oral Daily  . sodium chloride flush  3 mL Intravenous Q12H   Continuous Infusions: . pantoprozole (PROTONIX) infusion 8 mg/hr (02/26/16 0557)   PRN Meds:.sodium chloride, acetaminophen **OR** acetaminophen, ondansetron **OR** ondansetron (ZOFRAN) IV, sodium chloride flush   Data Review:   Micro Results No results found for this or any previous visit (from the past 240 hour(s)).  Radiology Reports US Breast Ltd Uni Right Inc Axilla  02/11/2016  CLINICAL DATA:  35 year old female with lump in the right breast. Evaluate for abscess. EXAM: Limited ULTRASOUND OF THE right BREAST COMPARISON:  None FINDINGS: This is a limited evaluation of the right breast stop for possible abscess. This is not a screening study. Targeted sonographic  images of the right breast in the region of the palpable concern was performed. No abscess or fluid collection identified. A linear hypoechoic tubular and cord-like structure in the superficial soft tissues of the right breast at approximately 10 o'clock position and 4 cm from the nipple noted. 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 Doppler images do not demonstrate flow within the lumen of this structure. This is most compatible with a thrombosed subcutaneous vein of the breast and anterior chest wall. IMPRESSION: No fluid collection or abscess. Thrombosed superficial vein of the right breast and anterior chest wall. Electronically Signed   By: Anner Crete M.D.   On: 02/11/2016 22:02     CBC  Recent Labs Lab 02/24/16 1940 02/25/16 1037 02/26/16 0418  WBC 12.7*  --  12.5*  HGB 7.1* 6.6* 7.2*  HCT 24.6*  --  23.7*  PLT 416  --  364  MCV 62.0*  --  64.0*  MCH 17.8*  --  19.6*  MCHC 28.8*  --  30.6*  RDW 22.3*  --  24.9*    Chemistries   Recent Labs Lab 02/24/16 1940 02/26/16 0418  NA 138 140  K 3.7 3.9  CL 106 109  CO2 26 24  GLUCOSE 116* 90  BUN 14 11  CREATININE 0.94 0.91  CALCIUM 9.0 8.5*  AST 17  --   ALT 10*  --   ALKPHOS 58  --   BILITOT 0.2*  --    ------------------------------------------------------------------------------------------------------------------ estimated creatinine clearance is 100.5 mL/min (by C-G formula based on Cr of 0.91). ------------------------------------------------------------------------------------------------------------------ No results for input(s): HGBA1C in the last 72 hours. ------------------------------------------------------------------------------------------------------------------ No results for input(s): CHOL, HDL, LDLCALC, TRIG, CHOLHDL, LDLDIRECT in the last 72  hours. ------------------------------------------------------------------------------------------------------------------ No results for input(s): TSH, T4TOTAL, T3FREE, THYROIDAB in the last 72 hours.  Invalid input(s): FREET3 ------------------------------------------------------------------------------------------------------------------  Recent Labs  02/24/16 2300  VITAMINB12 272  FOLATE 12.2  TIBC 443  IRON 5*    Coagulation profile No results for input(s): INR, PROTIME in the last 168 hours.  No results for input(s): DDIMER in  the last 72 hours.  Cardiac Enzymes  Recent Labs Lab 02/24/16 1940  TROPONINI <0.03   ------------------------------------------------------------------------------------------------------------------ Invalid input(s): POCBNP    Assessment & Plan   Charlee Sitzer is a 35 y.o. female with a known history of Chronic anemia who was on iron pills comes to the emergency room after she started having some epigastric discomfort for last 3 days. Patient was seen recently in the emergency room for some pain over her chest and was prescribed meloxicam. She started having discomfort and some nausea after taking the last meloxicam. She reports having some dark stools however denies any blood clots or black tarry stools. She was tested guaiac positive in the emergency room.  1. Anemia appears chronic iron deficiency/microcytic anemia Recheck hgb if less then 7 transfuse Continue iv protonix Unable to comply or tolerate iron- iv iron infusion Will need out pt heme fu    2. Epigastric pain suspected gastritis -Continue IV protonic strip -await gi eval  3. Tobacco abuse. Patient advised smoking cessation    4. DVT prophylaxis with SCD and teds     Code Status Orders        Start     Ordered   02/24/16 2329  Full code   Continuous     02/24/16 2329    Code Status History    Date Active Date Inactive Code Status Order ID Comments User  Context   This patient has a current code status but no historical code status.           Consults  gi  DVT Prophylaxis   SCD  Lab Results  Component Value Date   PLT 364 02/26/2016     Time Spent in minutes  56min  Greater than 50% of time spent in care coordination and counseling patient regarding the condition and plan of care.   Dustin Flock M.D on 02/26/2016 at 3:56 PM  Between 7am to 6pm - Pager - [phone]  After 6pm go to [email or web address] - password [email or web address] [place] [facility]   Office  [phone]

## 2016-02-26 NOTE — Op Note (Signed)
[facility] Gastroenterology Patient Name: Carolyn [name] Procedure Date: 02/26/2016 2:13 PM MRN: [id number] Account #: [id number] Date of Birth: Aug 20, 1981 Admit Type: Inpatient Age: 35 Room: [facility] ENDO ROOM 4 Gender: Female Note Status: Finalized Procedure:            Upper GI endoscopy Indications:          Iron deficiency anemia Providers:            Lucilla Lame, MD Referring MD:         No Local Md, MD (Referring MD) Medicines:            Propofol per Anesthesia Complications:        No immediate complications. Procedure:            Pre-Anesthesia Assessment:                       - Prior to the procedure, a History and Physical was                        performed, and patient medications and allergies were                        reviewed. The patient's tolerance of previous                        anesthesia was also reviewed. The risks and benefits of                        the procedure and the sedation options and risks were                        discussed with the patient. All questions were                        answered, and informed consent was obtained. Prior                        Anticoagulants: The patient has taken no previous                        anticoagulant or antiplatelet agents. ASA Grade                        Assessment: II - A patient with mild systemic disease.                        After reviewing the risks and benefits, the patient was                        deemed in satisfactory condition to undergo the                        procedure.                       After obtaining informed consent, the endoscope was                        passed under direct vision. Throughout the procedure,  the patient's blood pressure, pulse, and oxygen                        saturations were monitored continuously. The Endoscope                        was introduced through the mouth, and advanced to the    second part of duodenum. The upper GI endoscopy was                        accomplished without difficulty. The patient tolerated                        the procedure well. Findings:      A small hiatal hernia was present.      Four non-bleeding cratered gastric ulcers with no stigmata of bleeding       were found in the stomach.      A few localized erosions without bleeding were found in the duodenal       bulb and in the first portion of the duodenum. Impression:           - Small hiatal hernia.                       - Non-bleeding gastric ulcers with no stigmata of                        bleeding.                       - Duodenal erosions without bleeding.                       - No specimens collected. Recommendation:       - Avoid NSAID's Procedure Code(s):    --- Professional ---                       [phone], Esophagogastroduodenoscopy, flexible, transoral;                        diagnostic, including collection of specimen(s) by                        brushing or washing, when performed (separate procedure) Diagnosis Code(s):    --- Professional ---                       D50.9, Iron deficiency anemia, unspecified                       K25.9, Gastric ulcer, unspecified as acute or chronic,                        without hemorrhage or perforation                       K26.9, Duodenal ulcer, unspecified as acute or chronic,                        without hemorrhage or perforation CPT copyright 2016 American Medical Association. All rights reserved. The codes documented in this report are preliminary and upon coder review may  be revised to meet current compliance requirements. Lucilla Lame, MD 02/26/2016 2:22:45 PM This report has been signed electronically. Number of Addenda: 0 Note Initiated On: 02/26/2016 2:13 PM      [facility]

## 2016-02-26 NOTE — Anesthesia Postprocedure Evaluation (Signed)
Anesthesia Post Note  Patient: Carolyn [name]  Procedure(s) Performed: Procedure(s) (LRB): ESOPHAGOGASTRODUODENOSCOPY (EGD) WITH PROPOFOL (N/A)  Patient location during evaluation: Endoscopy Anesthesia Type: General Level of consciousness: awake and alert Pain management: pain level controlled Vital Signs Assessment: post-procedure vital signs reviewed and stable Respiratory status: spontaneous breathing, nonlabored ventilation, respiratory function stable and patient connected to nasal cannula oxygen Cardiovascular status: blood pressure returned to baseline and stable Postop Assessment: no signs of nausea or vomiting Anesthetic complications: no    Last Vitals:  Filed Vitals:   02/26/16 1445 02/26/16 1455  BP: 116/79 117/80  Pulse: 86 85  Temp:    Resp: 14 15    Last Pain:  Filed Vitals:   02/26/16 1456  PainSc: 0-No pain                 Precious Haws [place]

## 2016-02-26 NOTE — Transfer of Care (Signed)
Immediate Anesthesia Transfer of Care Note  Patient: Carolyn [name]  Procedure(s) Performed: Procedure(s): ESOPHAGOGASTRODUODENOSCOPY (EGD) WITH PROPOFOL (N/A)  Patient Location: Endoscopy Unit  Anesthesia Type:General  Level of Consciousness: awake  Airway & Oxygen Therapy: Patient Spontanous Breathing and Patient connected to nasal cannula oxygen  Post-op Assessment: Report given to RN  Post vital signs: Reviewed  Last Vitals:  Filed Vitals:   02/26/16 1315 02/26/16 1425  BP: 122/86 103/58  Pulse: 56 89  Temp: 36.1 C 36.2 C  Resp:  18    Last Pain:  Filed Vitals:   02/26/16 1426  PainSc: 0-No pain         Complications: No apparent anesthesia complications

## 2016-02-26 NOTE — Anesthesia Preprocedure Evaluation (Signed)
Anesthesia Evaluation  Patient identified by MRN, date of birth, ID band Patient awake    Reviewed: Allergy & Precautions, H&P , NPO status , Patient's Chart, lab work & pertinent test results  Airway Mallampati: II  TM Distance: >3 FB Neck ROM: full    Dental  (+) Poor Dentition, Chipped, Missing, Partial Upper   Pulmonary Current Smoker,    Pulmonary exam normal breath sounds clear to auscultation       Cardiovascular Exercise Tolerance: Good (-) angina(-) Past MI and (-) DOE negative cardio ROS Normal cardiovascular exam Rhythm:regular Rate:Normal     Neuro/Psych negative neurological ROS  negative psych ROS   GI/Hepatic negative GI ROS, Neg liver ROS, neg GERD  ,  Endo/Other  negative endocrine ROS  Renal/GU negative Renal ROS  negative genitourinary   Musculoskeletal negative musculoskeletal ROS (+)   Abdominal   Peds negative pediatric ROS (+)  Hematology  (+) Blood dyscrasia, Sickle cell trait ,   Anesthesia Other Findings Past Medical History:   Sickle cell trait ([place])                                     History reviewed. No pertinent surgical history.  BMI    Body Mass Index   26.03 kg/m 2      Reproductive/Obstetrics negative OB ROS                             Anesthesia Physical Anesthesia Plan  ASA: III  Anesthesia Plan: General   Post-op Pain Management:    Induction:   Airway Management Planned:   Additional Equipment:   Intra-op Plan:   Post-operative Plan:   Informed Consent: I have reviewed the patients History and Physical, chart, labs and discussed the procedure including the risks, benefits and alternatives for the proposed anesthesia with the patient or authorized representative who has indicated his/her understanding and acceptance.   Dental Advisory Given  Plan Discussed with: Anesthesiologist, CRNA and Surgeon  Anesthesia Plan Comments:          Anesthesia Quick Evaluation

## 2016-02-27 LAB — CBC
HCT: 24.1 % — ABNORMAL LOW (ref 35.0–47.0)
Hemoglobin: 7.4 g/dL — ABNORMAL LOW (ref 12.0–16.0)
MCH: 19.8 pg — ABNORMAL LOW (ref 26.0–34.0)
MCHC: 30.6 g/dL — AB (ref 32.0–36.0)
MCV: 64.7 fL — AB (ref 80.0–100.0)
PLATELETS: 345 10*3/uL (ref 150–440)
RBC: 3.73 MIL/uL — ABNORMAL LOW (ref 3.80–5.20)
RDW: 25.3 % — AB (ref 11.5–14.5)
WBC: 11.2 10*3/uL — AB (ref 3.6–11.0)

## 2016-02-27 MED ORDER — PANTOPRAZOLE SODIUM 40 MG PO TBEC: 40.0000 mg | DELAYED_RELEASE_TABLET | Freq: Two times a day (BID) | ORAL | Status: DC

## 2016-02-27 MED ORDER — FERROUS SULFATE 325 (65 FE) MG PO TABS: 325.0000 mg | ORAL_TABLET | Freq: Three times a day (TID) | ORAL | Status: DC

## 2016-02-27 NOTE — Discharge Summary (Signed)
[place] at [place] NAME: Carolyn [name]    MR#:  [id number]  DATE OF BIRTH:  May 12, 1981  DATE OF ADMISSION:  02/24/2016 ADMITTING PHYSICIAN: Fritzi Mandes, MD  DATE OF DISCHARGE: 02/27/2016  PRIMARY CARE PHYSICIAN: [facility]    ADMISSION DIAGNOSIS:  GI bleed due to NSAIDs [K92.2, [id number]  DISCHARGE DIAGNOSIS:  Active Problems:   Anemia   GI bleed due to NSAIDs   Iron deficiency anemia, unspecified   Gastric ulcer without hemorrhage or perforation but with obstruction   Duodenal ulceration   SECONDARY DIAGNOSIS:   Past Medical History  Diagnosis Date  . Sickle cell trait [facility])     HOSPITAL COURSE:   Carolyn [name] is a 36 y.o. female with a known history of Chronic anemia who was on iron pills comes to the emergency room after she started having some epigastric discomfort for last 3 days. Patient was seen recently in the emergency room for some pain over her chest and was prescribed meloxicam. She started having discomfort and some nausea after taking the last meloxicam. She reports having some dark stools however denies any blood clots or black tarry stools. She was tested guaiac positive in the emergency room.  1. Anemia appears chronic iron deficiency/microcytic anemia- had GI bleed- dark stool.  Given PRBC transfusion  Hb stable for now, monitor.   Give oral iron on d/c.   2. Epigastric pain suspected gastritis -given IV protonic drip, now switch to oral PPI BID. -appreciated GI eval- EGD done- Found Duodenal Ulcers.  3. Tobacco abuse. Patient advised smoking cessation For 4 min.  4. DVT prophylaxis with SCD and teds  DISCHARGE CONDITIONS:   Stable.  CONSULTS OBTAINED:  Treatment Team:  Lucilla Lame, MD  DRUG ALLERGIES:  No Known Allergies  DISCHARGE MEDICATIONS:   Current Discharge Medication List    START taking these medications   Details  pantoprazole (PROTONIX)  40 MG tablet Take 1 tablet (40 mg total) by mouth 2 (two) times daily. Qty: 60 tablet, Refills: 0      CONTINUE these medications which have CHANGED   Details  ferrous sulfate 325 (65 FE) MG tablet Take 1 tablet (325 mg total) by mouth 3 (three) times daily with meals. Qty: 90 tablet, Refills: 3      CONTINUE these medications which have NOT CHANGED   Details  meloxicam (MOBIC) 15 MG tablet Take 1 tablet (15 mg total) by mouth daily. Qty: 30 tablet, Refills: 0    HYDROcodone-acetaminophen (NORCO) 5-325 MG tablet Take 1-2 tablets by mouth every 4 (four) hours as needed for moderate pain. Qty: 10 tablet, Refills: 0         DISCHARGE INSTRUCTIONS:    Follow with PMD in 2 weeks and GI in 1 month.  If you experience worsening of your admission symptoms, develop shortness of breath, life threatening emergency, suicidal or homicidal thoughts you must seek medical attention immediately by calling 911 or calling your MD immediately  if symptoms less severe.  You Must read complete instructions/literature along with all the possible adverse reactions/side effects for all the Medicines you take and that have been prescribed to you. Take any new Medicines after you have completely understood and accept all the possible adverse reactions/side effects.   Please note  You were cared for by a hospitalist during your hospital stay. If you have any questions about your discharge medications or the care you received while you were in  the hospital after you are discharged, you can call the unit and asked to speak with the hospitalist on call if the hospitalist that took care of you is not available. Once you are discharged, your primary care physician will handle any further medical issues. Please note that NO REFILLS for any discharge medications will be authorized once you are discharged, as it is imperative that you return to your primary care physician (or establish a relationship with a primary  care physician if you do not have one) for your aftercare needs so that they can reassess your need for medications and monitor your lab values.    Today   CHIEF COMPLAINT:   Chief Complaint  Patient presents with  . Abdominal Pain    HISTORY OF PRESENT ILLNESS:  Carolyn [name]  is a 35 y.o. female with a known history of Chronic anemia who was on iron pills comes to the emergency room after she started having some epigastric discomfort for last 3 days. Patient was seen recently in the emergency room for some pain over her chest and was prescribed meloxicam. She started having discomfort and some nausea after taking the last meloxicam. She reports having some dark stools however denies any blood clots or black tarry stools. She was tested guaiac positive in the emergency room. Her hemoglobin is 7.1. She denies any bright red blood per rectum or any hematemesis she is being admitted for anemia which appears to be chronic. Patient states she has been followed at [place] for her anemia however she has not been taking her iron pills as she is supposed to. She has normal menstrual cycles. She has not had any workup for anemia in the past   VITAL SIGNS:  Blood pressure 110/68, pulse 85, temperature 98.1 F (36.7 C), temperature source Oral, resp. rate 20, height 5\' 9"  (1.753 m), weight 80.015 kg (176 lb 6.4 oz), last menstrual period 02/23/2016, SpO2 100 %.  I/O:   Intake/Output Summary (Last 24 hours) at 02/27/16 1117 Last data filed at 02/27/16 0900  Gross per 24 hour  Intake   1120 ml  Output      0 ml  Net   1120 ml    PHYSICAL EXAMINATION:  GENERAL:  35 y.o.-year-old patient lying in the bed with no acute distress.  EYES: Pupils equal, round, reactive to light and accommodation. No scleral icterus. Extraocular muscles intact.  HEENT: Head atraumatic, normocephalic. Oropharynx and nasopharynx clear.  NECK:  Supple, no jugular venous distention. No thyroid  enlargement, no tenderness.  LUNGS: Normal breath sounds bilaterally, no wheezing, rales,rhonchi or crepitation. No use of accessory muscles of respiration.  CARDIOVASCULAR: S1, S2 normal. No murmurs, rubs, or gallops.  ABDOMEN: Soft, non-tender, non-distended. Bowel sounds present. No organomegaly or mass.  EXTREMITIES: No pedal edema, cyanosis, or clubbing.  NEUROLOGIC: Cranial nerves II through XII are intact. Muscle strength 5/5 in all extremities. Sensation intact. Gait not checked.  PSYCHIATRIC: The patient is alert and oriented x 3.  SKIN: No obvious rash, lesion, or ulcer.   DATA REVIEW:   CBC  Recent Labs Lab 02/27/16 0541  WBC 11.2*  HGB 7.4*  HCT 24.1*  PLT 345    Chemistries   Recent Labs Lab 02/24/16 1940 02/26/16 0418  NA 138 140  K 3.7 3.9  CL 106 109  CO2 26 24  GLUCOSE 116* 90  BUN 14 11  CREATININE 0.94 0.91  CALCIUM 9.0 8.5*  AST 17  --   ALT  10*  --   ALKPHOS 58  --   BILITOT 0.2*  --     Cardiac Enzymes  Recent Labs Lab 02/24/16 1940  TROPONINI <0.03    Microbiology Results  No results found for this or any previous visit.  RADIOLOGY:  No results found.  EKG:   Orders placed or performed during the hospital encounter of 02/24/16  . ED EKG  . ED EKG  . EKG 12-Lead  . EKG 12-Lead      Management plans discussed with the patient, family and they are in agreement.  CODE STATUS:     Code Status Orders        Start     Ordered   02/24/16 2329  Full code   Continuous     02/24/16 2329    Code Status History    Date Active Date Inactive Code Status Order ID Comments User Context   This patient has a current code status but no historical code status.      TOTAL TIME TAKING CARE OF THIS PATIENT: 35 minutes.    Vaughan Basta M.D on 02/27/2016 at 11:17 AM  Between 7am to 6pm - Pager - [phone]  After 6pm go to [email or web address] - password [email or web address] [place] [facility]  Office   [phone]  CC: Primary care physician; [facility] Department   Note: This dictation was prepared with Dragon dictation along with smaller phrase technology. Any transcriptional errors that result from this process are unintentional.

## 2016-02-29 ENCOUNTER — Encounter: Payer: Self-pay | Admitting: Gastroenterology

## 2016-11-28 DIAGNOSIS — F321 Major depressive disorder, single episode, moderate: Secondary | ICD-10-CM | POA: Insufficient documentation

## 2016-11-28 DIAGNOSIS — F411 Generalized anxiety disorder: Secondary | ICD-10-CM | POA: Insufficient documentation

## 2017-06-29 ENCOUNTER — Emergency Department: Payer: Self-pay

## 2017-06-29 ENCOUNTER — Emergency Department
Admission: EM | Admit: 2017-06-29 | Discharge: 2017-06-29 | Disposition: A | Discharge status: Home / Self Care | Payer: Self-pay | Attending: Emergency Medicine | Admitting: Emergency Medicine

## 2017-06-29 ENCOUNTER — Encounter: Payer: Self-pay | Admitting: Emergency Medicine

## 2017-06-29 DIAGNOSIS — R1013 Epigastric pain: Secondary | ICD-10-CM

## 2017-06-29 DIAGNOSIS — F17210 Nicotine dependence, cigarettes, uncomplicated: Secondary | ICD-10-CM | POA: Insufficient documentation

## 2017-06-29 DIAGNOSIS — E876 Hypokalemia: Secondary | ICD-10-CM | POA: Insufficient documentation

## 2017-06-29 DIAGNOSIS — K30 Functional dyspepsia: Secondary | ICD-10-CM | POA: Insufficient documentation

## 2017-06-29 LAB — URINALYSIS, COMPLETE (UACMP) WITH MICROSCOPIC
BILIRUBIN URINE: NEGATIVE
Glucose, UA: NEGATIVE mg/dL
Ketones, ur: 5 mg/dL — AB
Leukocytes, UA: NEGATIVE
Nitrite: NEGATIVE
PROTEIN: NEGATIVE mg/dL
SPECIFIC GRAVITY, URINE: 1.024 (ref 1.005–1.030)
pH: 5 (ref 5.0–8.0)

## 2017-06-29 LAB — CBC WITH DIFFERENTIAL/PLATELET
Basophils Absolute: 0.1 10*3/uL (ref 0–0.1)
Basophils Relative: 1 %
Eosinophils Absolute: 0.1 10*3/uL (ref 0–0.7)
Eosinophils Relative: 1 %
HEMATOCRIT: 21.1 % — AB (ref 35.0–47.0)
Hemoglobin: 6.4 g/dL — ABNORMAL LOW (ref 12.0–16.0)
LYMPHS ABS: 2.8 10*3/uL (ref 1.0–3.6)
LYMPHS PCT: 31 %
MCH: 18 pg — ABNORMAL LOW (ref 26.0–34.0)
MCHC: 30.3 g/dL — AB (ref 32.0–36.0)
MCV: 59.5 fL — AB (ref 80.0–100.0)
MONO ABS: 0.4 10*3/uL (ref 0.2–0.9)
MONOS PCT: 4 %
NEUTROS ABS: 6 10*3/uL (ref 1.4–6.5)
Neutrophils Relative %: 63 %
Platelets: 371 10*3/uL (ref 150–440)
RBC: 3.55 MIL/uL — ABNORMAL LOW (ref 3.80–5.20)
RDW: 21.3 % — AB (ref 11.5–14.5)
WBC: 9.3 10*3/uL (ref 3.6–11.0)

## 2017-06-29 LAB — COMPREHENSIVE METABOLIC PANEL
ALBUMIN: 3.8 g/dL (ref 3.5–5.0)
ALT: 13 U/L — ABNORMAL LOW (ref 14–54)
AST: 22 U/L (ref 15–41)
Alkaline Phosphatase: 40 U/L (ref 38–126)
Anion gap: 8 (ref 5–15)
BILIRUBIN TOTAL: 0.4 mg/dL (ref 0.3–1.2)
BUN: 8 mg/dL (ref 6–20)
CHLORIDE: 107 mmol/L (ref 101–111)
CO2: 26 mmol/L (ref 22–32)
Calcium: 8.6 mg/dL — ABNORMAL LOW (ref 8.9–10.3)
Creatinine, Ser: 0.65 mg/dL (ref 0.44–1.00)
GFR calc Af Amer: >60 mL/min (ref >60)
GFR calc non Af Amer: >60 mL/min (ref >60)
GLUCOSE: 98 mg/dL (ref 65–99)
POTASSIUM: 2.9 mmol/L — AB (ref 3.5–5.1)
SODIUM: 141 mmol/L (ref 135–145)
Total Protein: 7.2 g/dL (ref 6.5–8.1)

## 2017-06-29 LAB — POCT PREGNANCY, URINE: PREG TEST UR: NEGATIVE

## 2017-06-29 LAB — LIPASE, BLOOD: Lipase: 32 U/L (ref 11–51)

## 2017-06-29 MED ORDER — IOPAMIDOL (ISOVUE-300) INJECTION 61%
30.0000 mL | Freq: Once | INTRAVENOUS | Status: AC
  Administered 2017-06-29: 30 mL via ORAL

## 2017-06-29 MED ORDER — ALUMINUM-MAGNESIUM-SIMETHICONE 200-200-20 MG/5ML PO SUSP: 30.0000 mL | Freq: Three times a day (TID) | ORAL | 0 refills | Status: DC

## 2017-06-29 MED ORDER — OMEPRAZOLE 40 MG PO CPDR: 40.0000 mg | DELAYED_RELEASE_CAPSULE | Freq: Every day | ORAL | 0 refills | Status: DC

## 2017-06-29 MED ORDER — POTASSIUM CHLORIDE CRYS ER 20 MEQ PO TBCR
40.0000 meq | EXTENDED_RELEASE_TABLET | Freq: Once | ORAL | Status: AC
  Administered 2017-06-29: 40 meq via ORAL
  Filled 2017-06-29: qty 2

## 2017-06-29 MED ORDER — METOCLOPRAMIDE HCL 10 MG PO TABS: 10.0000 mg | ORAL_TABLET | Freq: Four times a day (QID) | ORAL | 0 refills | Status: DC | PRN

## 2017-06-29 MED ORDER — METOCLOPRAMIDE HCL 10 MG PO TABS
10.0000 mg | ORAL_TABLET | Freq: Once | ORAL | Status: AC
  Administered 2017-06-29: 10 mg via ORAL
  Filled 2017-06-29: qty 1

## 2017-06-29 MED ORDER — IOPAMIDOL (ISOVUE-300) INJECTION 61%
100.0000 mL | Freq: Once | INTRAVENOUS | Status: AC | PRN
  Administered 2017-06-29: 100 mL via INTRAVENOUS

## 2017-06-29 NOTE — ED Triage Notes (Signed)
Pt reports upper abdominal pain for two days, diarrhea for one week and one episode of bright red blood in her stool. Denies nausea and vomiting. Pt reports history of PUD. Pt in no apparent distress in triage.

## 2017-06-29 NOTE — ED Notes (Signed)
Pt given drink and crackers at this time

## 2017-06-29 NOTE — ED Provider Notes (Signed)
[facility] Emergency Department Provider Note  ____________________________________________  Time seen: Approximately 9:10 AM  I have reviewed the triage vital signs and the nursing notes.   HISTORY  Chief Complaint Abdominal Pain and Diarrhea    HPI Carolyn [name] is a 36 y.o. female who complains of upper abdominal pain for 2 days, nonradiating, worse with eating, no alleviating factors. No nausea vomiting. She has diarrhea for the past week and bloody stool today. Reports this feels like peptic ulcer disease that she had in the past.  Review of electronic medical record shows May 2017 she was admitted for similar symptoms, had an upper endoscopy which showed for gastric ulcers and 2 duodenal ulcers. Patient was prescribed Carafate and Protonix but she reports she has not been on these medications for some time.  She also has early satiety whenever she tries to eat. She also has a 20 pound unintentional weight loss over the last 2-3 months.     Past Medical History:  Diagnosis Date  . Sickle cell trait [facility])      Patient Active Problem List   Diagnosis Date Noted  . Iron deficiency anemia, unspecified   . Gastric ulcer without hemorrhage or perforation but with obstruction   . Duodenal ulceration   . GI bleed due to NSAIDs   . Anemia 02/24/2016     Past Surgical History:  Procedure Laterality Date  . ESOPHAGOGASTRODUODENOSCOPY (EGD) WITH PROPOFOL N/A 02/26/2016   Procedure: ESOPHAGOGASTRODUODENOSCOPY (EGD) WITH PROPOFOL;  Surgeon: Lucilla Lame, MD;  Location: [facility] ENDOSCOPY;  Service: Endoscopy;  Laterality: N/A;     Prior to Admission medications   Medication Sig Start Date End Date Taking? Authorizing Provider  aluminum-magnesium hydroxide-simethicone (MAALOX) [phone] MG/5ML SUSP Take 30 mLs by mouth 4 (four) times daily -  before meals and at bedtime. 06/29/17   Carrie Mew, MD  ferrous sulfate 325 (65 FE) MG tablet Take 1  tablet (325 mg total) by mouth 3 (three) times daily with meals. Patient not taking: Reported on 06/29/2017 02/27/16   Vaughan Basta, MD  HYDROcodone-acetaminophen [facility]) 5-325 MG tablet Take 1-2 tablets by mouth every 4 (four) hours as needed for moderate pain. Patient not taking: Reported on 02/24/2016 02/11/16   Beers, Pierce Crane, PA-C  meloxicam (MOBIC) 15 MG tablet Take 1 tablet (15 mg total) by mouth daily. Patient not taking: Reported on 06/29/2017 02/11/16   Beers, Pierce Crane, PA-C  metoCLOPramide (REGLAN) 10 MG tablet Take 1 tablet (10 mg total) by mouth every 6 (six) hours as needed. 06/29/17   Carrie Mew, MD  omeprazole (PRILOSEC) 40 MG capsule Take 1 capsule (40 mg total) by mouth daily. 06/29/17   Carrie Mew, MD  pantoprazole (PROTONIX) 40 MG tablet Take 1 tablet (40 mg total) by mouth 2 (two) times daily. Patient not taking: Reported on 06/29/2017 02/27/16   Vaughan Basta, MD     Allergies Patient has no known allergies.   No family history on file.  Social History Social History  Substance Use Topics  . Smoking status: Current Every Day Smoker    Packs/day: 0.50    Types: Cigarettes  . Smokeless tobacco: Not on file  . Alcohol use Yes    Review of Systems  Constitutional:   No fever or chills.  ENT:   No sore throat. No rhinorrhea. Cardiovascular:   No chest pain or syncope. Respiratory:   No dyspnea or cough. Gastrointestinal:   positive as above for abdominal pain and diarrhea without vomiting  Musculoskeletal:   Negative for focal pain or swelling All other systems reviewed and are negative except as documented above in ROS and HPI.  ____________________________________________   PHYSICAL EXAM:  VITAL SIGNS: ED Triage Vitals  Enc Vitals Group     BP 06/29/17 0720 117/65     Pulse Rate 06/29/17 0720 78     Resp 06/29/17 0720 20     Temp 06/29/17 0720 98.1 F (36.7 C)     Temp Source 06/29/17 0720 Oral     SpO2 06/29/17 0720  100 %     Weight 06/29/17 0721 160 lb (72.6 kg)     Height 06/29/17 0721 5\' 9"  (1.753 m)     Head Circumference --      Peak Flow --      Pain Score 06/29/17 0722 9     Pain Loc --      Pain Edu? --      Excl. in [place]? --     Vital signs reviewed, nursing assessments reviewed.   Constitutional:   Alert and oriented. Well appearing and in no distress. Eyes:   No scleral icterus.  EOMI. No nystagmus. No conjunctival pallor. PERRL. ENT   Head:   Normocephalic and atraumatic.   Nose:   No congestion/rhinnorhea.    Mouth/Throat:   MMM, no pharyngeal erythema. No peritonsillar mass.    Neck:   No meningismus. Full ROM Hematological/Lymphatic/Immunilogical:   No cervical lymphadenopathy. Cardiovascular:   RRR. Symmetric bilateral radial and DP pulses.  No murmurs.  Respiratory:   Normal respiratory effort without tachypnea/retractions. Breath sounds are clear and equal bilaterally. No wheezes/rales/rhonchi. Gastrointestinal:   Soft with diffuse tenderness in the upper abdomen. Non distended. There is no CVA tenderness.  positive guarding, no rebound or rigidity.rectal exam performed with nurse at bedside, no hemorrhoids, Hemoccult negative stool, controls okay Genitourinary:   deferred Musculoskeletal:   Normal range of motion in all extremities. No joint effusions.  No lower extremity tenderness.  No edema. Neurologic:   Normal speech and language.  Motor grossly intact. No gross focal neurologic deficits are appreciated.  Skin:    Skin is warm, dry and intact. No rash noted.  No petechiae, purpura, or bullae.  ____________________________________________    LABS (pertinent positives/negatives) (all labs ordered are listed, but only abnormal results are displayed) Labs Reviewed  COMPREHENSIVE METABOLIC PANEL - Abnormal; Notable for the following:       Result Value   Potassium 2.9 (*)    Calcium 8.6 (*)    ALT 13 (*)    All other components within normal limits  CBC  WITH DIFFERENTIAL/PLATELET - Abnormal; Notable for the following:    RBC 3.55 (*)    Hemoglobin 6.4 (*)    HCT 21.1 (*)    MCV 59.5 (*)    MCH 18.0 (*)    MCHC 30.3 (*)    RDW 21.3 (*)    All other components within normal limits  URINALYSIS, COMPLETE (UACMP) WITH MICROSCOPIC - Abnormal; Notable for the following:    Color, Urine YELLOW (*)    APPearance HAZY (*)    Hgb urine dipstick SMALL (*)    Ketones, ur 5 (*)    Bacteria, UA RARE (*)    Squamous Epithelial / LPF 6-30 (*)    All other components within normal limits  LIPASE, BLOOD  POC URINE PREG, ED  POCT PREGNANCY, URINE   ____________________________________________   EKG    ____________________________________________    RADIOLOGY  Ct Abdomen Pelvis W Contrast  Result Date: 06/29/2017 CLINICAL DATA:  Abdominal pain for 2 days. Diarrhea for 1 week. One episode of bright red blood per rectum. No nausea or vomiting. EXAM: CT ABDOMEN AND PELVIS WITH CONTRAST TECHNIQUE: Multidetector CT imaging of the abdomen and pelvis was performed using the standard protocol following bolus administration of intravenous contrast. CONTRAST:  142mL ISOVUE-300 IOPAMIDOL (ISOVUE-300) INJECTION 61% COMPARISON:  None. FINDINGS: Lower chest: Clear lung bases.  Heart normal in size. Hepatobiliary: Liver normal in size and attenuation. No mass or focal lesion. Probable small dependent gallstone. No gallbladder wall thickening or evidence of inflammation. No bile duct dilation. Pancreas: Unremarkable. No pancreatic ductal dilatation or surrounding inflammatory changes. Spleen: Normal in size without focal abnormality. Adrenals/Urinary Tract: Adrenal glands are unremarkable. Kidneys are normal, without renal calculi, focal lesion, or hydronephrosis. Bladder is unremarkable. Stomach/Bowel: Stomach is within normal limits. Appendix appears normal. No evidence of bowel wall thickening, distention, or inflammatory changes. Vascular/Lymphatic: No  significant vascular findings are present. No enlarged abdominal or pelvic lymph nodes. Reproductive: Uterus and bilateral adnexa are unremarkable. Other: No abdominal wall hernia or abnormality. No abdominopelvic ascites. Musculoskeletal: No acute or significant osseous findings. IMPRESSION: 1. No acute findings.  No findings to account patient's symptoms. 2. Probable small gallstone.  No other abnormalities. Electronically Signed   By: Lajean Manes M.D.   On: 06/29/2017 10:54    ____________________________________________   PROCEDURES Procedures  ____________________________________________   INITIAL IMPRESSION / ASSESSMENT AND PLAN / ED COURSE  Pertinent labs & imaging results that were available during my care of the patient were reviewed by me and considered in my medical decision making (see chart for details).  patient presents with upper abdominal pain, nontoxic with normal vital signs. Concern for peptic ulcer disease, GI bleed, perforation or obstruction, biliary disease. We'll check labs and CT scan. Her extensive ulcer disease on endoscopy 1 year ago raises the possibility of Zollinger-Ellison syndrome, which can be followed up by outpatient GI if CT scan is negative. The patient is not septic. Low suspicion for AAA or dissection. No evidence of ACS PE pneumothorax or pneumonia.  Clinical Course as of Jun 29 1224  Thu Jun 29, 2017  1118 Chronic baseline Hemoglobin: (!) 6.4 [PS]    Clinical Course User Index [PS] Carrie Mew, MD    ----------------------------------------- 12:23 PM on 06/29/2017 ----------------------------------------- CT negative. Vitals remain normal. Patient is feeling better and tolerating oral intake. He likely has ongoing GERD or peptic ulcer disease. Restart antacids, follow up with gastroenterology. She does have chronic anemia which appears to be at baseline and without acute symptoms. No evidence of acute GI bleeding or other blood loss at  this time. Counseled The patient to restart her iron supplements once her symptoms are under control.    ____________________________________________   FINAL CLINICAL IMPRESSION(S) / ED DIAGNOSES  Final diagnoses:  Dyspepsia  Hypokalemia  epigastric pain    New Prescriptions   ALUMINUM-MAGNESIUM HYDROXIDE-SIMETHICONE (MAALOX) [phone] MG/5ML SUSP    Take 30 mLs by mouth 4 (four) times daily -  before meals and at bedtime.   METOCLOPRAMIDE (REGLAN) 10 MG TABLET    Take 1 tablet (10 mg total) by mouth every 6 (six) hours as needed.   OMEPRAZOLE (PRILOSEC) 40 MG CAPSULE    Take 1 capsule (40 mg total) by mouth daily.     Portions of this note were generated with dragon dictation software. Dictation errors may occur despite best attempts at proofreading.  Carrie Mew, MD 06/29/17 1226

## 2017-06-29 NOTE — ED Notes (Signed)
PREG TEST NEGATIVE

## 2017-06-29 NOTE — ED Notes (Signed)
Pt with hx of PUD and was seen here last year for same, had endoscopy completed with non bleeding ulcers resulting. Pt states they gave her a GI cocktail with relief.

## 2018-02-01 ENCOUNTER — Other Ambulatory Visit: Payer: Self-pay

## 2018-02-01 ENCOUNTER — Emergency Department: Payer: Self-pay

## 2018-02-01 ENCOUNTER — Emergency Department
Admission: EM | Admit: 2018-02-01 | Discharge: 2018-02-01 | Disposition: A | Discharge status: Home / Self Care | Payer: Self-pay | Attending: Emergency Medicine | Admitting: Emergency Medicine

## 2018-02-01 DIAGNOSIS — R1310 Dysphagia, unspecified: Secondary | ICD-10-CM | POA: Insufficient documentation

## 2018-02-01 DIAGNOSIS — Z79899 Other long term (current) drug therapy: Secondary | ICD-10-CM | POA: Insufficient documentation

## 2018-02-01 DIAGNOSIS — D649 Anemia, unspecified: Secondary | ICD-10-CM | POA: Insufficient documentation

## 2018-02-01 DIAGNOSIS — F17210 Nicotine dependence, cigarettes, uncomplicated: Secondary | ICD-10-CM | POA: Insufficient documentation

## 2018-02-01 LAB — TROPONIN I

## 2018-02-01 LAB — CBC
HCT: 22.2 % — ABNORMAL LOW (ref 35.0–47.0)
Hemoglobin: 6.5 g/dL — ABNORMAL LOW (ref 12.0–16.0)
MCH: 17.3 pg — AB (ref 26.0–34.0)
MCHC: 29.3 g/dL — AB (ref 32.0–36.0)
MCV: 59 fL — ABNORMAL LOW (ref 80.0–100.0)
PLATELETS: 274 10*3/uL (ref 150–440)
RBC: 3.75 MIL/uL — ABNORMAL LOW (ref 3.80–5.20)
RDW: 22.5 % — ABNORMAL HIGH (ref 11.5–14.5)
WBC: 8.2 10*3/uL (ref 3.6–11.0)

## 2018-02-01 LAB — BASIC METABOLIC PANEL
Anion gap: 6 (ref 5–15)
BUN: 9 mg/dL (ref 6–20)
CALCIUM: 8.8 mg/dL — AB (ref 8.9–10.3)
CO2: 26 mmol/L (ref 22–32)
Chloride: 109 mmol/L (ref 101–111)
Creatinine, Ser: 0.7 mg/dL (ref 0.44–1.00)
GFR calc Af Amer: >60 mL/min (ref >60)
GFR calc non Af Amer: >60 mL/min (ref >60)
GLUCOSE: 121 mg/dL — AB (ref 65–99)
Potassium: 3.3 mmol/L — ABNORMAL LOW (ref 3.5–5.1)
Sodium: 141 mmol/L (ref 135–145)

## 2018-02-01 LAB — POCT PREGNANCY, URINE: Preg Test, Ur: NEGATIVE

## 2018-02-01 MED ORDER — GI COCKTAIL ~~LOC~~
30.0000 mL | Freq: Once | ORAL | Status: AC
  Administered 2018-02-01: 30 mL via ORAL

## 2018-02-01 MED ORDER — GI COCKTAIL ~~LOC~~
ORAL | Status: AC
  Administered 2018-02-01: 30 mL via ORAL
  Filled 2018-02-01: qty 30

## 2018-02-01 NOTE — ED Provider Notes (Signed)
[facility] Emergency Department Provider Note  ____________________________________________   I have reviewed the triage vital signs and the nursing notes. Where available I have reviewed prior notes and, if possible and indicated, outside hospital notes.    HISTORY  Chief Complaint Chest Pain    HPI Nohea D Lafuente is a 37 y.o. female  who states that she's been having trouble with swallowing for the last 3 months. She states it's only when she eats. She states it feels that she has a cut her food and smaller segments to swallow. She has no sensation of retained foreign body, she has no exertional symptoms, she has no shortness of breath, she has nausea or vomiting, she has no melena no bright red blood per rectum. She does have a history of heavy periods, she is not on any birth control. Her baseline hemoglobin is between 6.5 and 7 and that is normal for her. She does take iron supplementation. She does not feel anemic or otherwise unwell. She is at her baseline health and I'll respect she states except for hurts to swallow. Patient did have an endoscopy a year ago proximally she estimates for ulcers. Patient states that she's had no melena bright red blood per rectum. to cause rectal exam at this time. She states that she does take antacids twice a day and that helps control her reflux symptoms. The symptoms that she is complaining of today began gradually at gradually worsened since 3 months ago. She does cut her food in small pieces when she eats as a result of this progressive dysphasia.  patient does admit to drinking occasional alcohol and she does sometimes take nonsteroidal pain medications.   Past Medical History:  Diagnosis Date  . Sickle cell trait [facility])     Patient Active Problem List   Diagnosis Date Noted  . Iron deficiency anemia, unspecified   . Gastric ulcer without hemorrhage or perforation but with obstruction   . Duodenal ulceration   .  GI bleed due to NSAIDs   . Anemia 02/24/2016    Past Surgical History:  Procedure Laterality Date  . ESOPHAGOGASTRODUODENOSCOPY (EGD) WITH PROPOFOL N/A 02/26/2016   Procedure: ESOPHAGOGASTRODUODENOSCOPY (EGD) WITH PROPOFOL;  Surgeon: Lucilla Lame, MD;  Location: [facility] ENDOSCOPY;  Service: Endoscopy;  Laterality: N/A;    Prior to Admission medications   Medication Sig Start Date End Date Taking? Authorizing Provider  aluminum-magnesium hydroxide-simethicone (MAALOX) [phone] MG/5ML SUSP Take 30 mLs by mouth 4 (four) times daily -  before meals and at bedtime. 06/29/17   Carrie Mew, MD  ferrous sulfate 325 (65 FE) MG tablet Take 1 tablet (325 mg total) by mouth 3 (three) times daily with meals. Patient not taking: Reported on 06/29/2017 02/27/16   Vaughan Basta, MD  HYDROcodone-acetaminophen [facility]) 5-325 MG tablet Take 1-2 tablets by mouth every 4 (four) hours as needed for moderate pain. Patient not taking: Reported on 02/24/2016 02/11/16   Beers, Pierce Crane, PA-C  meloxicam (MOBIC) 15 MG tablet Take 1 tablet (15 mg total) by mouth daily. Patient not taking: Reported on 06/29/2017 02/11/16   Beers, Pierce Crane, PA-C  metoCLOPramide (REGLAN) 10 MG tablet Take 1 tablet (10 mg total) by mouth every 6 (six) hours as needed. 06/29/17   Carrie Mew, MD  omeprazole (PRILOSEC) 40 MG capsule Take 1 capsule (40 mg total) by mouth daily. 06/29/17   Carrie Mew, MD  pantoprazole (PROTONIX) 40 MG tablet Take 1 tablet (40 mg total) by mouth 2 (two) times daily.  Patient not taking: Reported on 06/29/2017 02/27/16   Vaughan Basta, MD  diphenhydramine-acetaminophen (TYLENOL PM) 25-500 MG TABS Take 1 tablet by mouth at bedtime as needed.  02/11/16  [provider]    Allergies Patient has no known allergies.  No family history on file.  Social History Social History   Tobacco Use  . Smoking status: Current Every Day Smoker    Packs/day: 0.50    Types: Cigarettes   Substance Use Topics  . Alcohol use: Yes  . Drug use: Not on file    Review of Systems Constitutional: No fever/chills Eyes: No visual changes. ENT: No sore throat. No stiff neck no neck pain Cardiovascular: Denies chest pain. Respiratory: Denies shortness of breath. Gastrointestinal:   no vomiting.  No diarrhea.  No constipation. Genitourinary: Negative for dysuria. Musculoskeletal: Negative lower extremity swelling Skin: Negative for rash. Neurological: Negative for severe headaches, focal weakness or numbness.   ____________________________________________   PHYSICAL EXAM:  VITAL SIGNS: ED Triage Vitals  Enc Vitals Group     BP 02/01/18 1554 117/67     Pulse Rate 02/01/18 1554 82     Resp 02/01/18 1554 18     Temp 02/01/18 1554 98.3 F (36.8 C)     Temp Source 02/01/18 1554 Oral     SpO2 02/01/18 1554 100 %     Weight 02/01/18 1600 168 lb (76.2 kg)     Height 02/01/18 1600 5\' 9"  (1.753 m)     Head Circumference --      Peak Flow --      Pain Score --      Pain Loc --      Pain Edu? --      Excl. in [place]? --     Constitutional: Alert and oriented. Well appearing and in no acute distress. Eyes: Conjunctivae are normal Head: Atraumatic HEENT: No congestion/rhinnorhea. Mucous membranes are moist.  Oropharynx non-erythematous Neck:   Nontender with no meningismus, no masses, no stridor Cardiovascular: Normal rate, regular rhythm. Grossly normal heart sounds.  Good peripheral circulation. Respiratory: Normal respiratory effort.  No retractions. Lungs CTAB. Abdominal: Soft and nontender. No distention. No guarding no rebound Back:  There is no focal tenderness or step off.  there is no midline tenderness there are no lesions noted. there is no CVA tenderness  Musculoskeletal: No lower extremity tenderness, no upper extremity tenderness. No joint effusions, no DVT signs strong distal pulses no edema Neurologic:  Normal speech and language. No gross focal neurologic  deficits are appreciated.  Skin:  Skin is warm, dry and intact. No rash noted. Psychiatric: Mood and affect are normal. Speech and behavior are normal.  ____________________________________________   LABS (all labs ordered are listed, but only abnormal results are displayed)  Labs Reviewed  BASIC METABOLIC PANEL - Abnormal; Notable for the following components:      Result Value   Potassium 3.3 (*)    Glucose, Bld 121 (*)    Calcium 8.8 (*)    All other components within normal limits  CBC - Abnormal; Notable for the following components:   RBC 3.75 (*)    Hemoglobin 6.5 (*)    HCT 22.2 (*)    MCV 59.0 (*)    MCH 17.3 (*)    MCHC 29.3 (*)    RDW 22.5 (*)    All other components within normal limits  TROPONIN I  POC URINE PREG, ED  POCT PREGNANCY, URINE    Pertinent labs  results that  were available during my care of the patient were reviewed by me and considered in my medical decision making (see chart for details). ____________________________________________  EKG  I personally interpreted any EKGs ordered by me or triage normal sinus rhythm, rate 90s from an acute ST elevation or depression normal axis unremarkable EKG ____________________________________________  RADIOLOGY  Pertinent labs & imaging results that were available during my care of the patient were reviewed by me and considered in my medical decision making (see chart for details). If possible, patient and/or family made aware of any abnormal findings.  Dg Chest 2 View  Result Date: 02/01/2018 CLINICAL DATA:  Mid chest pain radiating to the back, intermittent for the past 3 months. EXAM: CHEST - 2 VIEW COMPARISON:  None. FINDINGS: The heart size and mediastinal contours are within normal limits. Both lungs are clear. The visualized skeletal structures are unremarkable. IMPRESSION: Normal chest x-ray. Electronically Signed   By: Titus Dubin M.D.   On: 02/01/2018 16:50    ____________________________________________    PROCEDURES  Procedure(s) performed: None  Procedures  Critical Care performed: None  ____________________________________________   INITIAL IMPRESSION / ASSESSMENT AND PLAN / ED COURSE  Pertinent labs & imaging results that were available during my care of the patient were reviewed by me and considered in my medical decision making (see chart for details). I personally reviewed x-rays   Patient here for progressive dysphagia, no evidence of obstruction, no evidence of GI bleed declines exam, hemoglobin is stable over the last several years albeit quite low. I've advised her to avoid alcohol and nonsteroidal pain medication, patient also advised to follow closely with her OB/GYN to be placed on birth control because I think that her chronic heavy periods is contributing to her chronic anemia. She is to continue taking iron.At this time, there does not appear to be clinical evidence to support the diagnosis of pulmonary embolus, dissection, myocarditis, endocarditis, pericarditis, pericardial tamponade, acute coronary syndrome, pneumothorax, pneumonia, or any other acute intrathoracic pathology that will require admission or acute intervention. Nor is there evidence of any significant intra-abdominal pathology causing this discomfort.Considering the patient's symptoms, medical history, and physical examination today, I have low suspicion for cholecystitis or biliary pathology, pancreatitis, perforation or bowel obstruction, hernia, intra-abdominal abscess, AAA or dissection, volvulus or intussusception, mesenteric ischemia, ischemic gut, pyelonephritis or appendicitis.  I do feel at this time there is most likely a esophageal component to her pain and she has progressive dysphagia she does have relationship with GI and will have her follow closely. Patient will continue to take antacids. She is absolutely required to come back to the emergency  room for any sign of bleeding including melena, bright red blood per rectum etc. and she is understanding of this.    ____________________________________________   FINAL CLINICAL IMPRESSION(S) / ED DIAGNOSES  Final diagnoses:  None      This chart was dictated using voice recognition software.  Despite best efforts to proofread,  errors can occur which can change meaning.      Schuyler Amor, MD 02/01/18 [phone]

## 2018-02-01 NOTE — ED Notes (Signed)
Pt c/o esophogeal and chest pain after eating for the past few months now. Pt describes her pain as irritating and pain when she swallows. Pt denies any other symptoms of n/v.

## 2018-02-01 NOTE — Discharge Instructions (Addendum)
Do not take alcohol, or ibuprofen or Motrin or other nonsteroidal pain medications. If you have any black stool, bright red blood from your bottom, or bleeding of any kind return immediately to the emergency room as her baseline anemia makes this more dangerous free than it is for others. Culture food and very small bites. If you feel that you have an obstruction meaning you have difficulty swallowing her secretions or a feeling that something is stuck in her throat, please return immediately to the emergency department. Please follow closely with your GI doctor, for help with your esophagus. Do not stop taking her antiacids. Please take foods that are not likely to irritate your stomach as described.we do noticed again that you are quite anemic, this is a chronic condition for you. You may benefit from birth controls from your primary care doctor/[facility]. Please follow-up with them closely as I think that would help chronic anemia.

## 2018-02-01 NOTE — ED Triage Notes (Signed)
Pt arrived via POV with reports of mid chest pain radiating to the back. Pt states pain has been present off and on x 3 months.  Pt states when she eats she gets a pain in the middle of her chest. Pt states the pain is getting worse.  Pt has family hx of Lung CA diagnosed at young age.

## 2018-02-13 ENCOUNTER — Encounter: Payer: Self-pay | Admitting: Gastroenterology

## 2018-02-13 ENCOUNTER — Ambulatory Visit: Payer: Self-pay | Admitting: Gastroenterology

## 2018-03-20 DIAGNOSIS — N921 Excessive and frequent menstruation with irregular cycle: Secondary | ICD-10-CM | POA: Insufficient documentation

## 2018-03-20 DIAGNOSIS — N920 Excessive and frequent menstruation with regular cycle: Secondary | ICD-10-CM | POA: Insufficient documentation

## 2018-03-20 LAB — HM PAP SMEAR: HM Pap smear: NEGATIVE

## 2018-10-08 LAB — HM HIV SCREENING LAB: HM HIV Screening: NEGATIVE

## 2018-12-24 ENCOUNTER — Other Ambulatory Visit: Payer: Self-pay

## 2018-12-24 ENCOUNTER — Emergency Department
Admission: EM | Admit: 2018-12-24 | Discharge: 2018-12-24 | Disposition: A | Discharge status: Home / Self Care | Payer: Self-pay | Attending: Emergency Medicine | Admitting: Emergency Medicine

## 2018-12-24 DIAGNOSIS — J039 Acute tonsillitis, unspecified: Secondary | ICD-10-CM | POA: Insufficient documentation

## 2018-12-24 DIAGNOSIS — F17210 Nicotine dependence, cigarettes, uncomplicated: Secondary | ICD-10-CM | POA: Insufficient documentation

## 2018-12-24 MED ORDER — AMOXICILLIN 500 MG PO CAPS: 500.0000 mg | ORAL_CAPSULE | Freq: Three times a day (TID) | ORAL | 0 refills | Status: DC

## 2018-12-24 NOTE — ED Triage Notes (Signed)
Patient c/o sore throat for 2-3 days.

## 2018-12-24 NOTE — ED Notes (Signed)
ED Provider at bedside. 

## 2018-12-24 NOTE — ED Provider Notes (Signed)
[facility] Emergency Department Provider Note   ____________________________________________   None    (approximate)  I have reviewed the triage vital signs and the nursing notes.   HISTORY  Chief Complaint Sore Throat    HPI Carolyn [name] is a 38 y.o. female patient claims sore throat for 2 to 3 days.  Patient denies other URI signs and symptoms.  Patient is able to tolerate food and fluids.  Patient state unsure of fever.  Patient able to tolerate food and fluids.  Patient rates pain discomfort as a 5/10.  Patient described the pain as "sore".  No palliative measure for complaint.     Past Medical History:  Diagnosis Date  . Sickle cell trait [facility])     Patient Active Problem List   Diagnosis Date Noted  . Iron deficiency anemia, unspecified   . Gastric ulcer without hemorrhage or perforation but with obstruction   . Duodenal ulceration   . GI bleed due to NSAIDs   . Anemia 02/24/2016    Past Surgical History:  Procedure Laterality Date  . ESOPHAGOGASTRODUODENOSCOPY (EGD) WITH PROPOFOL N/A 02/26/2016   Procedure: ESOPHAGOGASTRODUODENOSCOPY (EGD) WITH PROPOFOL;  Surgeon: Lucilla Lame, MD;  Location: [facility] ENDOSCOPY;  Service: Endoscopy;  Laterality: N/A;    Prior to Admission medications   Medication Sig Start Date End Date Taking? Authorizing Provider  aluminum-magnesium hydroxide-simethicone (MAALOX) [phone] MG/5ML SUSP Take 30 mLs by mouth 4 (four) times daily -  before meals and at bedtime. Patient not taking: Reported on 12/24/2018 06/29/17   Carrie Mew, MD  amoxicillin (AMOXIL) 500 MG capsule Take 1 capsule (500 mg total) by mouth 3 (three) times daily. 12/24/18   Sable Feil, PA-C  ferrous sulfate 325 (65 FE) MG tablet Take 1 tablet (325 mg total) by mouth 3 (three) times daily with meals. Patient not taking: Reported on 06/29/2017 02/27/16   Vaughan Basta, MD  HYDROcodone-acetaminophen [facility]) 5-325 MG tablet Take  1-2 tablets by mouth every 4 (four) hours as needed for moderate pain. Patient not taking: Reported on 02/24/2016 02/11/16   Beers, Pierce Crane, PA-C  meloxicam (MOBIC) 15 MG tablet Take 1 tablet (15 mg total) by mouth daily. Patient not taking: Reported on 06/29/2017 02/11/16   Beers, Pierce Crane, PA-C  metoCLOPramide (REGLAN) 10 MG tablet Take 1 tablet (10 mg total) by mouth every 6 (six) hours as needed. Patient not taking: Reported on 12/24/2018 06/29/17   Carrie Mew, MD  omeprazole (PRILOSEC) 40 MG capsule Take 1 capsule (40 mg total) by mouth daily. Patient not taking: Reported on 12/24/2018 06/29/17   Carrie Mew, MD  pantoprazole (PROTONIX) 40 MG tablet Take 1 tablet (40 mg total) by mouth 2 (two) times daily. Patient not taking: Reported on 06/29/2017 02/27/16   Vaughan Basta, MD    Allergies Patient has no known allergies.  No family history on file.  Social History Social History   Tobacco Use  . Smoking status: Current Every Day Smoker    Packs/day: 0.50    Types: Cigarettes  Substance Use Topics  . Alcohol use: Yes  . Drug use: Not on file    Review of Systems  Constitutional: No fever/chills Eyes: No visual changes. ENT: No sore throat. Cardiovascular: Denies chest pain. Respiratory: Denies shortness of breath. Gastrointestinal: No abdominal pain.  No nausea, no vomiting.  No diarrhea.  No constipation. Genitourinary: Negative for dysuria. Musculoskeletal: Negative for back pain. Skin: Negative for rash. Neurological: Negative for headaches, focal weakness or  numbness. Hematological/Lymphatic: Sickle cell trait  ____________________________________________   PHYSICAL EXAM:  VITAL SIGNS: ED Triage Vitals  Enc Vitals Group     BP 12/24/18 1232 106/70     Pulse Rate 12/24/18 1232 78     Resp 12/24/18 1232 18     Temp 12/24/18 1232 98.9 F (37.2 C)     Temp Source 12/24/18 1232 Oral     SpO2 12/24/18 1232 100 %     Weight 12/24/18 1236 163 lb  (73.9 kg)     Height 12/24/18 1236 5\' 9"  (1.753 m)     Head Circumference --      Peak Flow --      Pain Score 12/24/18 1232 5     Pain Loc --      Pain Edu? --      Excl. in [place]? --    Constitutional: Alert and oriented. Well appearing and in no acute distress. Nose: No congestion/rhinnorhea. Mouth/Throat: Mucous membranes are moist.  Oropharynx erythematous.  Edematous exudative tonsils. Neck: No stridor.   Hematological/Lymphatic/Immunilogical: No cervical lymphadenopathy. Cardiovascular: Normal rate, regular rhythm. Grossly normal heart sounds.  Good peripheral circulation. Respiratory: Normal respiratory effort.  No retractions. Lungs CTAB. Skin:  Skin is warm, dry and intact. No rash noted. Psychiatric: Mood and affect are normal. Speech and behavior are normal.  ____________________________________________   LABS (all labs ordered are listed, but only abnormal results are displayed)  Labs Reviewed - No data to display ____________________________________________  EKG   ____________________________________________  RADIOLOGY  ED MD interpretation:    Official radiology report(s): No results found.  ____________________________________________   PROCEDURES  Procedure(s) performed (including Critical Care):  Procedures   ____________________________________________   INITIAL IMPRESSION / ASSESSMENT AND PLAN / ED COURSE  As part of my medical decision making, I reviewed the following data within the [place]         Patient presents with sore throat for 3 days.  Patient physical exam was remarkable for edematous exudative tonsils.  Patient physical exam is consistent with tonsillitis.  Patient given discharge care instruction advised take medication as directed.     ____________________________________________   FINAL CLINICAL IMPRESSION(S) / ED DIAGNOSES  Final diagnoses:  Tonsillitis     ED Discharge Orders          Ordered    amoxicillin (AMOXIL) 500 MG capsule  3 times daily     12/24/18 1247           Note:  This document was prepared using Dragon voice recognition software and may include unintentional dictation errors.    Sable Feil, PA-C 12/24/18 1300    Delman Kitten, MD 12/24/18 2219

## 2019-04-11 ENCOUNTER — Other Ambulatory Visit: Payer: Self-pay

## 2019-04-11 ENCOUNTER — Encounter: Payer: Self-pay | Admitting: Emergency Medicine

## 2019-04-11 ENCOUNTER — Emergency Department
Admission: EM | Admit: 2019-04-11 | Discharge: 2019-04-11 | Disposition: A | Discharge status: Home / Self Care | Payer: Medicaid Other | Attending: Emergency Medicine | Admitting: Emergency Medicine

## 2019-04-11 DIAGNOSIS — Z5321 Procedure and treatment not carried out due to patient leaving prior to being seen by health care provider: Secondary | ICD-10-CM | POA: Insufficient documentation

## 2019-04-11 HISTORY — DX: Gastro-esophageal reflux disease without esophagitis: K21.9

## 2019-04-11 NOTE — ED Notes (Addendum)
Pt states to this tech "my boss wants me to get checked out for the coronavirus and I tried to tell her my throat hurting was coming from my acid reflux, I have a history of reflux" First RN notified

## 2019-04-11 NOTE — ED Triage Notes (Signed)
Patient reports pain in throat and epigastric area when she swallows. Reports this has been ongoing for months. Patient states she doesn't think her medication is working anymore.

## 2019-04-14 ENCOUNTER — Emergency Department: Payer: Self-pay

## 2019-04-14 ENCOUNTER — Other Ambulatory Visit: Payer: Self-pay

## 2019-04-14 ENCOUNTER — Encounter: Payer: Self-pay | Admitting: Emergency Medicine

## 2019-04-14 ENCOUNTER — Emergency Department
Admission: EM | Admit: 2019-04-14 | Discharge: 2019-04-14 | Disposition: A | Discharge status: Home / Self Care | Payer: Self-pay | Attending: Emergency Medicine | Admitting: Emergency Medicine

## 2019-04-14 DIAGNOSIS — R1310 Dysphagia, unspecified: Secondary | ICD-10-CM | POA: Insufficient documentation

## 2019-04-14 DIAGNOSIS — R1319 Other dysphagia: Secondary | ICD-10-CM

## 2019-04-14 DIAGNOSIS — F17210 Nicotine dependence, cigarettes, uncomplicated: Secondary | ICD-10-CM | POA: Insufficient documentation

## 2019-04-14 NOTE — ED Provider Notes (Signed)
[name] Emergency Department Provider Note   ____________________________________________   None    (approximate)  I have reviewed the triage vital signs and the nursing notes.   HISTORY  Chief Complaint Sore Throat   HPI Carolyn [name] is a 38 y.o. female patient presents with sore throat and upper chest pain when she swallows food.  Patient states she has a foreign body sensation.  Patient denies dyspnea.  Patient denies dyspnea and states discomfort only occurs when she eats food.  Patient has a history of acid reflux.  Patient was diagnosed with esophageal ulcers 2 years ago by gastroenterologist and was placed on Prilosec.  Patient the medication no longer working.  Patient has not contact the treating gastroenterologist for complaint.         Past Medical History:  Diagnosis Date  . GERD (gastroesophageal reflux disease)   . Sickle cell trait [facility])     Patient Active Problem List   Diagnosis Date Noted  . Iron deficiency anemia, unspecified   . Gastric ulcer without hemorrhage or perforation but with obstruction   . Duodenal ulceration   . GI bleed due to NSAIDs   . Anemia 02/24/2016    Past Surgical History:  Procedure Laterality Date  . ESOPHAGOGASTRODUODENOSCOPY (EGD) WITH PROPOFOL N/A 02/26/2016   Procedure: ESOPHAGOGASTRODUODENOSCOPY (EGD) WITH PROPOFOL;  Surgeon: Lucilla Lame, MD;  Location: [facility] ENDOSCOPY;  Service: Endoscopy;  Laterality: N/A;    Prior to Admission medications   Medication Sig Start Date End Date Taking? Authorizing Provider  aluminum-magnesium hydroxide-simethicone (MAALOX) [phone] MG/5ML SUSP Take 30 mLs by mouth 4 (four) times daily -  before meals and at bedtime. Patient not taking: Reported on 12/24/2018 06/29/17   Carrie Mew, MD  amoxicillin (AMOXIL) 500 MG capsule Take 1 capsule (500 mg total) by mouth 3 (three) times daily. 12/24/18   Sable Feil, PA-C  ferrous sulfate 325 (65 FE) MG  tablet Take 1 tablet (325 mg total) by mouth 3 (three) times daily with meals. Patient not taking: Reported on 06/29/2017 02/27/16   Vaughan Basta, MD  HYDROcodone-acetaminophen [facility]) 5-325 MG tablet Take 1-2 tablets by mouth every 4 (four) hours as needed for moderate pain. Patient not taking: Reported on 02/24/2016 02/11/16   Beers, Pierce Crane, PA-C  meloxicam (MOBIC) 15 MG tablet Take 1 tablet (15 mg total) by mouth daily. Patient not taking: Reported on 06/29/2017 02/11/16   Beers, Pierce Crane, PA-C  metoCLOPramide (REGLAN) 10 MG tablet Take 1 tablet (10 mg total) by mouth every 6 (six) hours as needed. Patient not taking: Reported on 12/24/2018 06/29/17   Carrie Mew, MD  omeprazole (PRILOSEC) 40 MG capsule Take 1 capsule (40 mg total) by mouth daily. Patient not taking: Reported on 12/24/2018 06/29/17   Carrie Mew, MD  pantoprazole (PROTONIX) 40 MG tablet Take 1 tablet (40 mg total) by mouth 2 (two) times daily. Patient not taking: Reported on 06/29/2017 02/27/16   Vaughan Basta, MD    Allergies Patient has no known allergies.  No family history on file.  Social History Social History   Tobacco Use  . Smoking status: Current Every Day Smoker    Packs/day: 0.50    Types: Cigarettes  Substance Use Topics  . Alcohol use: Yes  . Drug use: Not on file    Review of Systems Constitutional: No fever/chills Eyes: No visual changes. ENT: Sore throat. Cardiovascular: Denies chest pain. Respiratory: Denies shortness of breath. Gastrointestinal: No abdominal pain.  No nausea,  no vomiting.  No diarrhea.  No constipation. Genitourinary: Negative for dysuria. Musculoskeletal: Negative for back pain. Skin: Negative for rash. Neurological: Negative for headaches, focal weakness or numbness. Hematological/Lymphatic:  Anemia and sickle cell trait.   ____________________________________________   PHYSICAL EXAM:  VITAL SIGNS: ED Triage Vitals  Enc Vitals Group      BP 04/14/19 0738 109/60     Pulse Rate 04/14/19 0738 88     Resp 04/14/19 0738 16     Temp 04/14/19 0738 98.9 F (37.2 C)     Temp Source 04/14/19 0738 Oral     SpO2 04/14/19 0738 99 %     Weight 04/14/19 0735 170 lb (77.1 kg)     Height 04/14/19 0735 5\' 9"  (1.753 m)     Head Circumference --      Peak Flow --      Pain Score 04/14/19 0734 0     Pain Loc --      Pain Edu? --      Excl. in [place]? --    Constitutional: Alert and oriented. Well appearing and in no acute distress.  Mouth/Throat: Mucous membranes are moist.  Oropharynx non-erythematous. Neck: No stridor.   Hematological/Lymphatic/Immunilogical: No cervical lymphadenopathy. Cardiovascular: Normal rate, regular rhythm. Grossly normal heart sounds.  Good peripheral circulation. Respiratory: Normal respiratory effort.  No retractions. Lungs CTAB. Gastrointestinal: Soft and nontender. No distention. No abdominal bruits. No CVA tenderness. Neurologic:  Normal speech and language. No gross focal neurologic deficits are appreciated. No gait instability. Skin:  Skin is warm, dry and intact. No rash noted. Psychiatric: Mood and affect are normal. Speech and behavior are normal.  ____________________________________________   LABS (all labs ordered are listed, but only abnormal results are displayed)  Labs Reviewed - No data to display ____________________________________________  EKG   ____________________________________________  RADIOLOGY  ED MD interpretation:    Official radiology report(s): Dg Neck Soft Tissue  Result Date: 04/14/2019 CLINICAL DATA:  Dysphagia and odynophagia EXAM: NECK SOFT TISSUES - 1+ VIEW COMPARISON:  None. FINDINGS: Frontal and lateral views were obtained. The epiglottis and aryepiglottic fold regions appear normal. Prevertebral soft tissues are normal. No air-fluid level to suggest abscess. Tongue and tongue base regions appear unremarkable. No radiopaque foreign body evident. No bony  abnormality. Lung apices clear. The tonsil and adenoidal regions do not appear prominent. IMPRESSION: No abnormality noted. Electronically Signed   By: Lowella Grip III M.D.   On: 04/14/2019 08:07    ____________________________________________   PROCEDURES  Procedure(s) performed (including Critical Care):  Procedures   ____________________________________________   INITIAL IMPRESSION / ASSESSMENT AND PLAN / ED COURSE  As part of my medical decision making, I reviewed the following data within the [place] was evaluated in Emergency Department on 04/14/2019 for the symptoms described in the history of present illness. She was evaluated in the context of the global COVID-19 pandemic, which necessitated consideration that the patient might be at risk for infection with the SARS-CoV-2 virus that causes COVID-19. Institutional protocols and algorithms that pertain to the evaluation of patients at risk for COVID-19 are in a state of rapid change based on information released by regulatory bodies including the CDC and federal and state organizations. These policies and algorithms were followed during the patient's care in the ED.  Patient presents with dysphagia and foreign body sensation in the throat.  Has a history of acid reflux and ulcers.  Patient state medications not helping.  Patient has not follow-up with gastroenterologist.  Further evaluation today with a soft tissue neck was unremarkable.  Patient given discharge care instructions.  Patient referred to gastroenterologist for definitive evaluation and treatment.      ____________________________________________   FINAL CLINICAL IMPRESSION(S) / ED DIAGNOSES  Final diagnoses:  Esophageal dysphagia     ED Discharge Orders    None       Note:  This document was prepared using Dragon voice recognition software and may include unintentional dictation errors.    Sable Feil, PA-C 04/14/19 9794    Delman Kitten, MD 04/14/19 [phone]

## 2019-04-14 NOTE — Discharge Instructions (Signed)
Follow discharge care instruction continue previous medication to evaluated by gastroneurologist.

## 2019-04-14 NOTE — ED Triage Notes (Signed)
Pt to ED via POV, pt states that she is having pain in her throat and chest area when she swallows her food. Pt states that she has hx/o acid reflux. Pt states that she has been taking medications to help but it is not working. Pt is in NAD.

## 2019-05-21 NOTE — Progress Notes (Deleted)
Primary Care Physician: Patient, No Pcp Per  Primary Gastroenterologist:  Dr. Lucilla Lame  No chief complaint on file.   HPI: Carolyn [name] is a 38 y.o. female here for follow-up after being seen in the emergency department.  In emergency department the patient was having some dysphasia and a feeling of fullness in her throat.  The patient reports that her Prilosec is no longer working.  The patient had an EGD by me back in 2017 and at that time was found to have 4 small nonbleeding ulcers in the stomach.  Current Outpatient Medications  Medication Sig Dispense Refill  . aluminum-magnesium hydroxide-simethicone (MAALOX) [phone] MG/5ML SUSP Take 30 mLs by mouth 4 (four) times daily -  before meals and at bedtime. (Patient not taking: Reported on 12/24/2018) 355 mL 0  . amoxicillin (AMOXIL) 500 MG capsule Take 1 capsule (500 mg total) by mouth 3 (three) times daily. 30 capsule 0  . ferrous sulfate 325 (65 FE) MG tablet Take 1 tablet (325 mg total) by mouth 3 (three) times daily with meals. (Patient not taking: Reported on 06/29/2017) 90 tablet 3  . HYDROcodone-acetaminophen (NORCO) 5-325 MG tablet Take 1-2 tablets by mouth every 4 (four) hours as needed for moderate pain. (Patient not taking: Reported on 02/24/2016) 10 tablet 0  . meloxicam (MOBIC) 15 MG tablet Take 1 tablet (15 mg total) by mouth daily. (Patient not taking: Reported on 06/29/2017) 30 tablet 0  . metoCLOPramide (REGLAN) 10 MG tablet Take 1 tablet (10 mg total) by mouth every 6 (six) hours as needed. (Patient not taking: Reported on 12/24/2018) 30 tablet 0  . omeprazole (PRILOSEC) 40 MG capsule Take 1 capsule (40 mg total) by mouth daily. (Patient not taking: Reported on 12/24/2018) 30 capsule 0  . pantoprazole (PROTONIX) 40 MG tablet Take 1 tablet (40 mg total) by mouth 2 (two) times daily. (Patient not taking: Reported on 06/29/2017) 60 tablet 0   No current facility-administered medications for this visit.     Allergies  as of 05/22/2019  . (No Known Allergies)    ROS:  General: Negative for anorexia, weight loss, fever, chills, fatigue, weakness. ENT: Negative for hoarseness, difficulty swallowing , nasal congestion. CV: Negative for chest pain, angina, palpitations, dyspnea on exertion, peripheral edema.  Respiratory: Negative for dyspnea at rest, dyspnea on exertion, cough, sputum, wheezing.  GI: See history of present illness. GU:  Negative for dysuria, hematuria, urinary incontinence, urinary frequency, nocturnal urination.  Endo: Negative for unusual weight change.    Physical Examination:   There were no vitals taken for this visit.  General: Well-nourished, well-developed in no acute distress.  Eyes: No icterus. Conjunctivae pink. Mouth: Oropharyngeal mucosa moist and pink , no lesions erythema or exudate. Lungs: Clear to auscultation bilaterally. Non-labored. Heart: Regular rate and rhythm, no murmurs rubs or gallops.  Abdomen: Bowel sounds are normal, nontender, nondistended, no hepatosplenomegaly or masses, no abdominal bruits or hernia , no rebound or guarding.   Extremities: No lower extremity edema. No clubbing or deformities. Neuro: Alert and oriented x 3.  Grossly intact. Skin: Warm and dry, no jaundice.   Psych: Alert and cooperative, normal mood and affect.  Labs:    Imaging Studies: No results found.  Assessment and Plan:   Carolyn [name] is a 38 y.o. y/o female ***    Lucilla Lame, MD. Marval Regal   Note: This dictation was prepared with Dragon dictation along with smaller phrase technology. Any transcriptional errors that result from this  process are unintentional.

## 2019-05-22 ENCOUNTER — Encounter: Payer: Self-pay | Admitting: Gastroenterology

## 2019-05-22 ENCOUNTER — Ambulatory Visit: Payer: Medicaid Other | Admitting: Gastroenterology

## 2019-07-01 DIAGNOSIS — E663 Overweight: Secondary | ICD-10-CM

## 2019-07-01 DIAGNOSIS — F411 Generalized anxiety disorder: Secondary | ICD-10-CM

## 2019-07-01 DIAGNOSIS — F321 Major depressive disorder, single episode, moderate: Secondary | ICD-10-CM

## 2019-07-02 ENCOUNTER — Ambulatory Visit: Payer: Self-pay

## 2019-07-10 ENCOUNTER — Ambulatory Visit: Payer: Medicaid Other

## 2019-07-15 ENCOUNTER — Other Ambulatory Visit: Payer: Self-pay

## 2019-07-15 ENCOUNTER — Emergency Department
Admission: EM | Admit: 2019-07-15 | Discharge: 2019-07-15 | Disposition: A | Discharge status: Home / Self Care | Payer: Medicaid Other | Attending: Emergency Medicine | Admitting: Emergency Medicine

## 2019-07-15 ENCOUNTER — Encounter: Payer: Self-pay | Admitting: Emergency Medicine

## 2019-07-15 DIAGNOSIS — F17210 Nicotine dependence, cigarettes, uncomplicated: Secondary | ICD-10-CM | POA: Insufficient documentation

## 2019-07-15 DIAGNOSIS — H00012 Hordeolum externum right lower eyelid: Secondary | ICD-10-CM

## 2019-07-15 MED ORDER — SULFACETAMIDE SODIUM 10 % OP SOLN: 2.0000 [drp] | OPHTHALMIC | 0 refills | Status: AC

## 2019-07-15 NOTE — ED Provider Notes (Signed)
[facility] Emergency Department Provider Note   ____________________________________________   First MD Initiated Contact with Patient 07/15/19 [phone]     (approximate)  I have reviewed the triage vital signs and the nursing notes.   HISTORY  Chief Complaint Eye Pain    HPI Carolyn [name] is a 38 y.o. female patient presents with right pain and drainage.  Patient states that yesterday morning she awakened with redness and swelling to the right lower eyelid.  Patient stated complaint crease this morning with matted eyelids upon awakening.  Patient denies vision disturbance.         Past Medical History:  Diagnosis Date  . GERD (gastroesophageal reflux disease)   . Sickle cell trait [facility])     Patient Active Problem List   Diagnosis Date Noted  . Menorrhagia 03/20/2018  . Generalized anxiety disorder 11/28/2016  . Major depressive disorder, single episode, moderate ([place]) 11/28/2016  . Iron deficiency anemia, unspecified   . Gastric ulcer without hemorrhage or perforation but with obstruction   . Duodenal ulceration   . GI bleed due to NSAIDs   . Anemia 02/24/2016  . 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 Overweight 07/14/2015    Past Surgical History:  Procedure Laterality Date  . ESOPHAGOGASTRODUODENOSCOPY (EGD) WITH PROPOFOL N/A 02/26/2016   Procedure: ESOPHAGOGASTRODUODENOSCOPY (EGD) WITH PROPOFOL;  Surgeon: Lucilla Lame, MD;  Location: [facility] ENDOSCOPY;  Service: Endoscopy;  Laterality: N/A;    Prior to Admission medications   Medication Sig Start Date End Date Taking? Authorizing Provider  sulfacetamide (BLEPH-10) 10 % ophthalmic solution Place 2 drops into the right eye every 4 (four) hours for 10 days. 07/15/19 07/25/19  Sable Feil, PA-C    Allergies Patient has no known allergies.  Family History  Problem Relation Age of Onset  . Diabetes Mother   . Hypertension Mother   . Lung cancer Father   . Lung cancer Maternal Grandmother   .  Colon cancer Maternal Grandfather     Social History Social History   Tobacco Use  . Smoking status: Current Every Day Smoker    Packs/day: 0.50    Types: Cigarettes  Substance Use Topics  . Alcohol use: Yes  . Drug use: Yes    Types: Marijuana    Review of Systems Constitutional: No fever/chills Eyes: No visual changes.  Right lower eyelid edema. ENT: No sore throat. Cardiovascular: Denies chest pain. Respiratory: Denies shortness of breath. Gastrointestinal: No abdominal pain.  No nausea, no vomiting.  No diarrhea.  No constipation. Genitourinary: Negative for dysuria. Musculoskeletal: Negative for back pain. Skin: Negative for rash. Neurological: Negative for headaches, focal weakness or numbness. Hematological/Lymphatic:  Sickle cell trait   ____________________________________________   PHYSICAL EXAM:  VITAL SIGNS: ED Triage Vitals  Enc Vitals Group     BP 07/15/19 0713 121/62     Pulse Rate 07/15/19 0713 73     Resp 07/15/19 0713 16     Temp 07/15/19 0713 98.6 F (37 C)     Temp Source 07/15/19 0713 Oral     SpO2 07/15/19 0713 100 %     Weight 07/15/19 0714 172 lb (78 kg)     Height 07/15/19 0714 5\' 9"  (1.753 m)     Head Circumference --      Peak Flow --      Pain Score 07/15/19 0714 7     Pain Loc --      Pain Edu? --      Excl. in  GC? --     Constitutional: Alert and oriented. Well appearing and in no acute distress. Eyes: Conjunctivae are normal. PERRL. EOMI. edematous right lower eyelid.  Papular lesion nasal aspect of the lower right eyelid.  Dry secretions lateral aspect of right thigh. Head: Atraumatic. Nose: No congestion/rhinnorhea. Mouth/Throat: Mucous membranes are moist.  Oropharynx non-erythematous. Cardiovascular: Normal rate, regular rhythm. Grossly normal heart sounds.  Good peripheral circulation. Respiratory: Normal respiratory effort.  No retractions. Lungs CTAB. Skin:  Skin is warm, dry and intact.  Papular lesion right lower  inner eyelid.   Psychiatric: Mood and affect are normal. Speech and behavior are normal.  ____________________________________________   LABS (all labs ordered are listed, but only abnormal results are displayed)  Labs Reviewed - No data to display ____________________________________________  EKG   ____________________________________________  RADIOLOGY  ED MD interpretation:    Official radiology report(s): No results found.  ____________________________________________   PROCEDURES  Procedure(s) performed (including Critical Care):  Procedures   ____________________________________________   INITIAL IMPRESSION / ASSESSMENT AND PLAN / ED COURSE  As part of my medical decision making, I reviewed the following data within the [place] was evaluated in Emergency Department on 07/15/2019 for the symptoms described in the history of present illness. She was evaluated in the context of the global COVID-19 pandemic, which necessitated consideration that the patient might be at risk for infection with the SARS-CoV-2 virus that causes COVID-19. Institutional protocols and algorithms that pertain to the evaluation of patients at risk for COVID-19 are in a state of rapid change based on information released by regulatory bodies including the CDC and federal and state organizations. These policies and algorithms were followed during the patient's care in the ED.  Patient presents with a papular lesion right lower eyelid with edema.  Physical exam is consistent with stye.  Patient given discharge care instructions and advised use eyedrops as directed.  Patient advised follow-up ophthalmology if no improvement or worsening complaint in the next 2 to 3 days.      ____________________________________________   FINAL CLINICAL IMPRESSION(S) / ED DIAGNOSES  Final diagnoses:  Hordeolum externum of right lower eyelid     ED  Discharge Orders         Ordered    sulfacetamide (BLEPH-10) 10 % ophthalmic solution  Every 4 hours     07/15/19 0741           Note:  This document was prepared using Dragon voice recognition software and may include unintentional dictation errors.    Sable Feil, PA-C 07/15/19 [id number]    Duffy Bruce, MD 07/16/19 1726

## 2019-07-15 NOTE — ED Notes (Signed)
See triage note  Presents with right eye red and swollen  States she felt irritation to right eye yesterday

## 2019-07-15 NOTE — ED Triage Notes (Signed)
Patient reports irritation in right eye yesterday. Reports she woke up this morning with redness and swelling to right eye. No known injury.

## 2019-08-20 ENCOUNTER — Ambulatory Visit: Payer: Medicaid Other

## 2019-10-16 ENCOUNTER — Ambulatory Visit: Payer: Medicaid Other

## 2019-12-10 ENCOUNTER — Ambulatory Visit: Payer: Medicaid Other

## 2020-01-02 ENCOUNTER — Ambulatory Visit: Payer: Medicaid Other

## 2020-05-07 ENCOUNTER — Ambulatory Visit: Payer: Medicaid Other

## 2020-05-11 ENCOUNTER — Ambulatory Visit: Payer: Medicaid Other | Attending: Critical Care Medicine

## 2020-05-11 DIAGNOSIS — Z23 Encounter for immunization: Secondary | ICD-10-CM

## 2020-05-11 NOTE — Progress Notes (Signed)
   Covid-19 Vaccination Clinic  Name:  Kelda Azad    MRN: [id number] DOB: Sep 27, 1981  05/11/2020  Ms. Moxley was observed post Covid-19 immunization for 15 minutes without incident. She was provided with Vaccine Information Sheet and instruction to access the V-Safe system.   Ms. Pegues was instructed to call 911 with any severe reactions post vaccine: Marland Kitchen Difficulty breathing  . Swelling of face and throat  . A fast heartbeat  . A bad rash all over body  . Dizziness and weakness   Immunizations Administered    Name Date Dose VIS Date Route   Moderna COVID-19 Vaccine 05/11/2020 11:14 AM 0.5 mL 09/2019 Intramuscular   Manufacturer: Moderna   Lot: [id number]   [place]: 60630-160-10

## 2020-06-03 ENCOUNTER — Ambulatory Visit: Payer: Medicaid Other

## 2020-06-26 ENCOUNTER — Other Ambulatory Visit: Payer: Self-pay

## 2020-06-26 ENCOUNTER — Emergency Department
Admission: EM | Admit: 2020-06-26 | Discharge: 2020-06-26 | Disposition: A | Discharge status: Home / Self Care | Payer: Medicaid Other | Attending: Emergency Medicine | Admitting: Emergency Medicine

## 2020-06-26 DIAGNOSIS — D509 Iron deficiency anemia, unspecified: Secondary | ICD-10-CM

## 2020-06-26 DIAGNOSIS — F17210 Nicotine dependence, cigarettes, uncomplicated: Secondary | ICD-10-CM | POA: Insufficient documentation

## 2020-06-26 LAB — BASIC METABOLIC PANEL
Anion gap: 8 (ref 5–15)
BUN: 8 mg/dL (ref 6–20)
CO2: 26 mmol/L (ref 22–32)
Calcium: 9 mg/dL (ref 8.9–10.3)
Chloride: 106 mmol/L (ref 98–111)
Creatinine, Ser: 0.73 mg/dL (ref 0.44–1.00)
GFR calc Af Amer: >60 mL/min (ref >60)
GFR calc non Af Amer: >60 mL/min (ref >60)
Glucose, Bld: 135 mg/dL — ABNORMAL HIGH (ref 70–99)
Potassium: 3.9 mmol/L (ref 3.5–5.1)
Sodium: 140 mmol/L (ref 135–145)

## 2020-06-26 LAB — CBC WITH DIFFERENTIAL/PLATELET
Abs Immature Granulocytes: 0.02 10*3/uL (ref 0.00–0.07)
Basophils Absolute: 0 10*3/uL (ref 0.0–0.1)
Basophils Relative: 0 %
Eosinophils Absolute: 0.2 10*3/uL (ref 0.0–0.5)
Eosinophils Relative: 2 %
HCT: 29.7 % — ABNORMAL LOW (ref 36.0–46.0)
Hemoglobin: 7.9 g/dL — ABNORMAL LOW (ref 12.0–15.0)
Immature Granulocytes: 0 %
Lymphocytes Relative: 25 %
Lymphs Abs: 1.9 10*3/uL (ref 0.7–4.0)
MCH: 18.6 pg — ABNORMAL LOW (ref 26.0–34.0)
MCHC: 26.6 g/dL — ABNORMAL LOW (ref 30.0–36.0)
MCV: 69.9 fL — ABNORMAL LOW (ref 80.0–100.0)
Monocytes Absolute: 0.6 10*3/uL (ref 0.1–1.0)
Monocytes Relative: 7 %
Neutro Abs: 5 10*3/uL (ref 1.7–7.7)
Neutrophils Relative %: 66 %
Platelets: 224 10*3/uL (ref 150–400)
RBC: 4.25 MIL/uL (ref 3.87–5.11)
RDW: 29.4 % — ABNORMAL HIGH (ref 11.5–15.5)
Smear Review: NORMAL
WBC: 7.7 10*3/uL (ref 4.0–10.5)
nRBC: 0 % (ref 0.0–0.2)

## 2020-06-26 MED ORDER — DOCUSATE SODIUM 100 MG PO CAPS: 100.0000 mg | ORAL_CAPSULE | Freq: Two times a day (BID) | ORAL | 2 refills | Status: AC | PRN

## 2020-06-26 MED ORDER — FERROUS SULFATE 325 (65 FE) MG PO TBEC: 325.0000 mg | DELAYED_RELEASE_TABLET | Freq: Two times a day (BID) | ORAL | 0 refills | Status: DC

## 2020-06-26 NOTE — ED Triage Notes (Signed)
States she tried to given plasma yesterday and told that her hematocrit was too low, sent to Ed. Denies any symptoms of sickenss. Feels well

## 2020-06-26 NOTE — ED Notes (Signed)
Pt states that she has been trying to donate blood, but was told last time and today that her hemocrit was too low and they told her she soul be evaluated. Pt reports Hct 29 and 30, respectively. Pt states she takes iron supplements and is currently on period- reports heavy menstruation as typical for her. Pt denies dizziness, weakness, CP, SOB, other s/s.

## 2020-06-26 NOTE — ED Provider Notes (Signed)
[facility] Emergency Department Provider Note  ____________________________________________   First MD Initiated Contact with Patient 06/26/20 1457     (approximate)  I have reviewed the triage vital signs and the nursing notes.   HISTORY  Chief Complaint Abnormal Lab    HPI Carolyn [name] is a 39 y.o. female  With GERD here with desire for recheck of Hgb. Pt states she has known, chronic iron deficiency. She has been trying to donate plasma and they reportedly sent her here for evaluation due to Hct 29. She denies any associated sx. Specifically, her energy level has been normal and she's had no CP, SOB, lightheadedness, dizziness, or other complaints. No syncope. No current vaginal or Gi bleeding. She takes an OTC vitamin w/ iron but otherwise no prescription iron supplements. Has donated plasma previously without difficulty.        Past Medical History:  Diagnosis Date  . GERD (gastroesophageal reflux disease)   . Sickle cell trait [facility])     Patient Active Problem List   Diagnosis Date Noted  . Menorrhagia 03/20/2018  . Generalized anxiety disorder 11/28/2016  . Major depressive disorder, single episode, moderate ([place]) 11/28/2016  . Iron deficiency anemia, unspecified   . Gastric ulcer without hemorrhage or perforation but with obstruction   . Duodenal ulceration   . GI bleed due to NSAIDs   . Anemia 02/24/2016  . 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 Overweight 07/14/2015    Past Surgical History:  Procedure Laterality Date  . ESOPHAGOGASTRODUODENOSCOPY (EGD) WITH PROPOFOL N/A 02/26/2016   Procedure: ESOPHAGOGASTRODUODENOSCOPY (EGD) WITH PROPOFOL;  Surgeon: Lucilla Lame, MD;  Location: [facility] ENDOSCOPY;  Service: Endoscopy;  Laterality: N/A;    Prior to Admission medications   Medication Sig Start Date End Date Taking? Authorizing Provider  docusate sodium (COLACE) 100 MG capsule Take 1 capsule (100 mg total) by mouth 2 (two) times daily as needed  for mild constipation. 06/26/20 06/26/21  Duffy Bruce, MD  ferrous sulfate 325 (65 FE) MG EC tablet Take 1 tablet (325 mg total) by mouth 2 (two) times daily. 06/26/20 09/24/20  Duffy Bruce, MD    Allergies Patient has no known allergies.  Family History  Problem Relation Age of Onset  . Diabetes Mother   . Hypertension Mother   . Lung cancer Father   . Lung cancer Maternal Grandmother   . Colon cancer Maternal Grandfather     Social History Social History   Tobacco Use  . Smoking status: Current Every Day Smoker    Packs/day: 0.50    Types: Cigarettes  Substance Use Topics  . Alcohol use: Yes  . Drug use: Yes    Types: Marijuana    Review of Systems  Review of Systems  Constitutional: Negative for chills and fever.  HENT: Negative for sore throat.   Respiratory: Negative for shortness of breath.   Cardiovascular: Negative for chest pain.  Gastrointestinal: Negative for abdominal pain.  Genitourinary: Negative for flank pain.  Musculoskeletal: Negative for neck pain.  Skin: Negative for rash and wound.  Allergic/Immunologic: Negative for immunocompromised state.  Neurological: Negative for weakness and numbness.  Hematological: Does not bruise/bleed easily.     ____________________________________________  PHYSICAL EXAM:      VITAL SIGNS: ED Triage Vitals  Enc Vitals Group     BP 06/26/20 1247 (!) 99/52     Pulse Rate 06/26/20 1247 90     Resp 06/26/20 1247 18     Temp 06/26/20 1247 98.8 F (  37.1 C)     Temp Source 06/26/20 1247 Oral     SpO2 06/26/20 1247 100 %     Weight 06/26/20 1248 169 lb (76.7 kg)     Height 06/26/20 1248 5\' 9"  (1.753 m)     Head Circumference --      Peak Flow --      Pain Score 06/26/20 1248 0     Pain Loc --      Pain Edu? --      Excl. in [place]? --      Physical Exam Vitals and nursing note reviewed.  Constitutional:      General: She is not in acute distress.    Appearance: She is well-developed.  HENT:     Head:  Normocephalic and atraumatic.  Eyes:     Conjunctiva/sclera: Conjunctivae normal.  Cardiovascular:     Rate and Rhythm: Normal rate and regular rhythm.     Heart sounds: Normal heart sounds.  Pulmonary:     Effort: Pulmonary effort is normal. No respiratory distress.     Breath sounds: No wheezing.  Abdominal:     General: There is no distension.  Musculoskeletal:     Cervical back: Neck supple.  Skin:    General: Skin is warm.     Capillary Refill: Capillary refill takes less than 2 seconds.     Findings: No rash.  Neurological:     Mental Status: She is alert and oriented to person, place, and time.     Motor: No abnormal muscle tone.       ____________________________________________   LABS (all labs ordered are listed, but only abnormal results are displayed)  Labs Reviewed  CBC WITH DIFFERENTIAL/PLATELET - Abnormal; Notable for the following components:      Result Value   Hemoglobin 7.9 (*)    HCT 29.7 (*)    MCV 69.9 (*)    MCH 18.6 (*)    MCHC 26.6 (*)    RDW 29.4 (*)    All other components within normal limits  BASIC METABOLIC PANEL - Abnormal; Notable for the following components:   Glucose, Bld 135 (*)    All other components within normal limits    ____________________________________________  EKG: None ________________________________________  RADIOLOGY All imaging, including plain films, CT scans, and ultrasounds, independently reviewed by me, and interpretations confirmed via formal radiology reads.  ED MD interpretation:   None  Official radiology report(s): No results found.  ____________________________________________  PROCEDURES   Procedure(s) performed (including Critical Care):  Procedures  ____________________________________________  INITIAL IMPRESSION / MDM / [place] / ED COURSE  As part of my medical decision making, I reviewed the following data within the [place] notes reviewed  and incorporated, Old chart reviewed, Notes from prior ED visits, and [place] Controlled Substance Database       *Carolyn [name] was evaluated in Emergency Department on 06/26/2020 for the symptoms described in the history of present illness. She was evaluated in the context of the global COVID-19 pandemic, which necessitated consideration that the patient might be at risk for infection with the SARS-CoV-2 virus that causes COVID-19. Institutional protocols and algorithms that pertain to the evaluation of patients at risk for COVID-19 are in a state of rapid change based on information released by regulatory bodies including the CDC and federal and state organizations. These policies and algorithms were followed during the patient's care in the ED.  Some ED evaluations and interventions may  be delayed as a result of limited staffing during the pandemic.*     Medical Decision Making:  39 yo F here with reported anemia. Hgb 7.9 and Hct 29.7 here, normal plt and WBC. Normal BMP. No signs of ongoing bleeding. H/o heavy periods and subsequent IDA, and suspect this is etiology of her moderate anemia today. This seems to be a chronic, longstanding issue and given that plt donation does not remove pRBCs, should not preclude her from donation. That being said, will encourage her to start ferrous sulfate 325 BID, with colace PRN and good outpt follow-up. Return precautions given. No RBC or plt donations.  ____________________________________________  FINAL CLINICAL IMPRESSION(S) / ED DIAGNOSES  Final diagnoses:  Microcytic anemia     MEDICATIONS GIVEN DURING THIS VISIT:  Medications - No data to display   ED Discharge Orders         Ordered    ferrous sulfate 325 (65 FE) MG EC tablet  2 times daily        06/26/20 1530    docusate sodium (COLACE) 100 MG capsule  2 times daily PRN        06/26/20 1530           Note:  This document was prepared using Dragon voice recognition software and may  include unintentional dictation errors.   Duffy Bruce, MD 06/26/20 1534

## 2020-08-06 ENCOUNTER — Ambulatory Visit: Payer: Self-pay | Admitting: Physician Assistant

## 2020-08-06 ENCOUNTER — Other Ambulatory Visit: Payer: Self-pay

## 2020-08-06 DIAGNOSIS — Z113 Encounter for screening for infections with a predominantly sexual mode of transmission: Secondary | ICD-10-CM

## 2020-08-06 LAB — WET PREP FOR TRICH, YEAST, CLUE
Trichomonas Exam: NEGATIVE
Yeast Exam: NEGATIVE

## 2020-08-07 ENCOUNTER — Encounter: Payer: Self-pay | Admitting: Physician Assistant

## 2020-08-07 NOTE — Progress Notes (Signed)
[facility] Department STI clinic/screening visit  Subjective:  Carolyn [name] is a 39 y.o. female being seen today for an STI screening visit. The patient reports they do have symptoms.  Patient reports that they do not desire a pregnancy in the next year.   They reported they are not interested in discussing contraception today.  No LMP recorded.   Patient has the following medical conditions:   Patient Active Problem List   Diagnosis Date Noted  . Menorrhagia 03/20/2018  . Generalized anxiety disorder 11/28/2016  . Major depressive disorder, single episode, moderate ([place]) 11/28/2016  . Iron deficiency anemia, unspecified   . Gastric ulcer without hemorrhage or perforation but with obstruction   . Duodenal ulceration   . GI bleed due to NSAIDs   . Anemia 02/24/2016  . 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 Overweight 07/14/2015    Chief Complaint  Patient presents with  . SEXUALLY TRANSMITTED DISEASE    screening    HPI  Patient reports that she has had a brownish discharge with odor and abdominal pain for 3 weeks.  States that the abdominal pain has mostly been in the upper abdomen and points to the epigastric area.  Reports a history of ulcers and blood transfusion due to bleeding from the ulcers.  States last HIV test was in 2019 and last pap was in 2020.  Patient states that she last douched 3 days ago.   See flowsheet for further details and programmatic requirements.    The following portions of the patient's history were reviewed and updated as appropriate: allergies, current medications, past medical history, past social history, past surgical history and problem list.  Objective:  There were no vitals filed for this visit.  Physical Exam Constitutional:      General: She is not in acute distress.    Appearance: Normal appearance.  HENT:     Head: Normocephalic and atraumatic.     Comments: No nits,lice, or hair loss. No cervical, supraclavicular or  axillary adenopathy.    Mouth/Throat:     Mouth: Mucous membranes are moist.     Pharynx: Oropharynx is clear. No oropharyngeal exudate or posterior oropharyngeal erythema.  Eyes:     Conjunctiva/sclera: Conjunctivae normal.  Pulmonary:     Effort: Pulmonary effort is normal.  Abdominal:     Palpations: Abdomen is soft. There is no mass.     Tenderness: There is no abdominal tenderness. There is no guarding or rebound.  Genitourinary:    General: Normal vulva.     Rectum: Normal.     Comments: External genitalia/pubic area without nits, lice, edema, erythema, lesions and inguinal adenopathy. Vagina with normal mucosa and discharge. Cervix without visible lesions. Uterus firm, mobile, nt, no masses, no CMT, no adnexal tenderness or fullness. Musculoskeletal:     Cervical back: Neck supple. No tenderness.  Skin:    General: Skin is warm and dry.     Findings: No bruising, erythema, lesion or rash.  Neurological:     Mental Status: She is alert and oriented to person, place, and time.  Psychiatric:        Mood and Affect: Mood normal.        Behavior: Behavior normal.        Thought Content: Thought content normal.        Judgment: Judgment normal.      Assessment and Plan:  Carolyn [name] is a 39 y.o. female presenting to the [facility] Department for  STI screening  1. Screening for STD (sexually transmitted disease) Patient into clinic without symptoms.  Reviewed wet mount results and no treatment is indicated today. Enc patient to follow up with PCP for evaluation of abdominal pain. Rec condoms with all sex. Await test results.  Counseled that RN will call if needs to RTC for treatment once results are back. - WET PREP FOR [place], [place], [place] [facility] - Syphilis Serology, [place] [facility]     No follow-ups on file.  Future Appointments  Date Time Provider [place]  08/10/2020 10:20 AM [facility] PROVIDER  [facility] None    Jerene Dilling, PA

## 2020-08-10 ENCOUNTER — Ambulatory Visit: Payer: Medicaid Other

## 2020-08-20 ENCOUNTER — Telehealth: Payer: Self-pay | Admitting: Family Medicine

## 2020-08-20 NOTE — Telephone Encounter (Signed)
Patient wants tes tresults

## 2020-08-20 NOTE — Telephone Encounter (Signed)
Call to patient, requesting results from 08/06/2020. RN reviewed results with patient and answered all questions.   Deri Fuelling, RN

## 2020-11-06 ENCOUNTER — Ambulatory Visit (LOCAL_COMMUNITY_HEALTH_CENTER): Payer: Medicaid Other | Admitting: Physician Assistant

## 2020-11-06 ENCOUNTER — Other Ambulatory Visit: Payer: Self-pay

## 2020-11-06 ENCOUNTER — Encounter: Payer: Self-pay | Admitting: Physician Assistant

## 2020-11-06 VITALS — BP 117/75 | Ht 65.0 in | Wt 174.0 lb

## 2020-11-06 DIAGNOSIS — Z113 Encounter for screening for infections with a predominantly sexual mode of transmission: Secondary | ICD-10-CM

## 2020-11-06 DIAGNOSIS — Z3041 Encounter for surveillance of contraceptive pills: Secondary | ICD-10-CM | POA: Diagnosis not present

## 2020-11-06 DIAGNOSIS — Z3202 Encounter for pregnancy test, result negative: Secondary | ICD-10-CM

## 2020-11-06 DIAGNOSIS — Z30012 Encounter for prescription of emergency contraception: Secondary | ICD-10-CM

## 2020-11-06 DIAGNOSIS — Z3009 Encounter for other general counseling and advice on contraception: Secondary | ICD-10-CM

## 2020-11-06 DIAGNOSIS — Z0000 Encounter for general adult medical examination without abnormal findings: Secondary | ICD-10-CM

## 2020-11-06 DIAGNOSIS — Z30011 Encounter for initial prescription of contraceptive pills: Secondary | ICD-10-CM

## 2020-11-06 DIAGNOSIS — Z3200 Encounter for pregnancy test, result unknown: Secondary | ICD-10-CM

## 2020-11-06 LAB — WET PREP FOR TRICH, YEAST, CLUE
Trichomonas Exam: NEGATIVE
Yeast Exam: NEGATIVE

## 2020-11-06 LAB — PREGNANCY, URINE: Preg Test, Ur: NEGATIVE

## 2020-11-06 NOTE — Progress Notes (Signed)
Patient here for physical and OCP. Patient wants emergency contraception for unprotected sex she had 5 days ago.Patient is late on her period. Will send for pregnancy test.  Deri Fuelling, RN   Pregnancy test negative. RN reviewed ECP instructions with patient. All questions answered.   Deri Fuelling, RN'  Post: RN reviewed wet mount with patient. No tx per S.O. RN gave patient written prescription for plan B and Micronor. Reminder card given for pap smear 03/2021. Tobacco cessation card and healthy habits booklet given. Deri Fuelling, RN

## 2020-11-07 ENCOUNTER — Encounter: Payer: Self-pay | Admitting: Physician Assistant

## 2020-11-07 MED ORDER — LEVONORGESTREL 1.5 MG PO TABS: 1.5000 mg | ORAL_TABLET | Freq: Once | ORAL | 0 refills | Status: AC

## 2020-11-07 MED ORDER — NORETHINDRONE 0.35 MG PO TABS: 1.0000 | ORAL_TABLET | Freq: Every day | ORAL | 11 refills | Status: DC

## 2020-11-07 NOTE — Progress Notes (Signed)
[facility] [place] Number: [phone]    Family Planning Visit- Initial Visit  Subjective:  Carolyn [name] is a 40 y.o.  G1P1001   being seen today for an initial well woman visit and to discuss family planning options.  She is currently using None for pregnancy prevention. Patient reports she does not want a pregnancy in the next year.  Patient has the following medical conditions has Anemia; GI bleed due to NSAIDs; Iron deficiency anemia, unspecified; Gastric ulcer without hemorrhage or perforation but with obstruction; Duodenal ulceration; Menorrhagia; Generalized anxiety disorder; Major depressive disorder, single episode, moderate ([place]); Genital HSV; and Overweight on their problem list.  Chief Complaint  Patient presents with  . Contraception    Annual PE and discuss BCM    Patient reports that she would like ECP and to start OCs as her BCM.  Denies chronic conditions and current medicines.  States that she has a history of an ulcer and anemia. States that some mornings she wakes up nauseous and thinks that is due to when she skips a meal before going to sleep.   Reports that she has noticed an increased amount of vaginal discharge but not other symptoms and that she has increased her water intake and is urinating more frequently.  Per chart review, CBE due today and pap due 03/2021.  Patient denies any other concerns.   Body mass index is 28.96 kg/m. - Patient is eligible for diabetes screening based on BMI and age >86?  not applicable VH8I ordered? not applicable  Patient reports 1  partner/s in last year. Desires STI screening?  Yes  Has patient been screened once for HCV in the past?  No  No results found for: HCVAB  Does the patient have current drug use (including MJ), have a partner with drug use, and/or has been incarcerated since last result? No  If yes-- Screen for HCV through [facility]  Lab   Does the patient meet criteria for HBV testing? No  Criteria:  -Household, sexual or needle sharing contact with HBV -History of drug use -HIV positive -Those with known Hep C   Health Maintenance Due  Topic Date Due  . Hepatitis C Screening  Never done  . INFLUENZA VACCINE  Never done  . COVID-19 Vaccine (2 - Moderna 3-dose series) 06/08/2020    Review of Systems  All other systems reviewed and are negative.   The following portions of the patient's history were reviewed and updated as appropriate: allergies, current medications, past family history, past medical history, past social history, past surgical history and problem list. Problem list updated.   See flowsheet for other program required questions.  Objective:   Vitals:   11/06/20 1449  BP: 117/75  Weight: 174 lb (78.9 kg)  Height: 5\' 5"  (1.651 m)    Physical Exam Vitals and nursing note reviewed.  Constitutional:      General: She is not in acute distress.    Appearance: Normal appearance.  HENT:     Head: Normocephalic and atraumatic.     Mouth/Throat:     Mouth: Mucous membranes are moist.     Pharynx: Oropharynx is clear. No oropharyngeal exudate or posterior oropharyngeal erythema.  Eyes:     Conjunctiva/sclera: Conjunctivae normal.  Neck:     Thyroid: No thyroid mass, thyromegaly or thyroid tenderness.  Cardiovascular:     Rate and Rhythm: Normal rate and regular rhythm.  Pulmonary:  Effort: Pulmonary effort is normal.     Breath sounds: Normal breath sounds.  Chest:  Breasts:     Right: Normal. No mass, nipple discharge, skin change, tenderness, axillary adenopathy or supraclavicular adenopathy.     Left: Normal. No mass, nipple discharge, skin change, tenderness, axillary adenopathy or supraclavicular adenopathy.    Abdominal:     Palpations: Abdomen is soft. There is no mass.     Tenderness: There is no abdominal tenderness. There is no guarding or rebound.  Musculoskeletal:      Cervical back: Neck supple. No tenderness.  Lymphadenopathy:     Cervical: No cervical adenopathy.     Upper Body:     Right upper body: No supraclavicular, axillary or pectoral adenopathy.     Left upper body: No supraclavicular, axillary or pectoral adenopathy.  Skin:    General: Skin is warm and dry.     Findings: No bruising, erythema, lesion or rash.  Neurological:     Mental Status: She is alert and oriented to person, place, and time.  Psychiatric:        Mood and Affect: Mood normal.        Behavior: Behavior normal.        Thought Content: Thought content normal.        Judgment: Judgment normal.       Assessment and Plan:  Carolyn [name] is a 40 y.o. female presenting to the [facility] Department for an initial well woman exam/family planning visit  Contraception counseling: Reviewed all forms of birth control options in the tiered based approach. available including abstinence; over the counter/barrier methods; hormonal contraceptive medication including pill, patch, ring, injection,contraceptive implant, ECP; hormonal and nonhormonal IUDs; permanent sterilization options including vasectomy and the various tubal sterilization modalities. Risks, benefits, and typical effectiveness rates were reviewed.  Questions were answered.  Written information was also given to the patient to review.  Patient desires to take ECP and start OCs, this was prescribed for patient. She will follow up in  1 year and prn for surveillance.  She was told to call with any further questions, or with any concerns about this method of contraception.  Emphasized use of condoms 100% of the time for STI prevention.  Patient was offered ECP based on unprotected sex 5 days ago. ECP was accepted by the patient. ECP counseling was given - see RN documentation  1. Encounter for counseling regarding contraception Reviewed with patient risks, benefits and SE as above re:  BCMs. Enc condoms  with all sex for STD protection.  Extensive discussion re: increased risks for patient's who smoke, are 35 or older and COCs. Counseled that we can offer progestin-only methods while she is smoking regularly.  2. Encounter for emergency contraceptive counseling and prescription Pregnancy test was negative today. Patient requests handwritten Rx for Plan B today. Patient given Rx and to take ASAP today.  - Pregnancy, urine - levonorgestrel (PLAN B ONE-STEP) 1.5 MG tablet; Take 1 tablet (1.5 mg total) by mouth once for 1 dose.  Dispense: 1 tablet; Refill: 0  3. OCP (oral contraceptive pills) initiation May start Norethindrone 28 d 1 po daily at the same time each day on 11/07/2020 Per patient request, handwritten Rx with refills for one year given to patient. Reviewed with patient SE of progestin only OC vs COCs and when to call clinic with concerns. - norethindrone (MICRONOR) 0.35 MG tablet; Take 1 tablet (0.35 mg total) by mouth daily.  Dispense: 28  tablet; Refill: 11  4. Screening for STD (sexually transmitted disease) Await test results.  Counseled that RN will call if needs to RTC for treatment once results are back.  - WET PREP FOR TRICH, YEAST, CLUE - Chlamydia/Gonorrhea [place] - HIV/HCV [place] [facility] - Syphilis Serology, [place] [facility]  5. Well woman exam (no gynecological exam) Reviewed with patient healthy habits to maintain general health. Enc MVI 1 po daily. Patient opted to RTC in 03/2021, to have pap collected. Enc to establish with/ follow up with PCP for primary care concerns, age appropriate screenings and illness.      Return in about 4 months (around 03/03/2021) for pap smear due.  No future appointments.  Jerene Dilling, PA

## 2020-11-09 ENCOUNTER — Emergency Department
Admission: EM | Admit: 2020-11-09 | Discharge: 2020-11-09 | Disposition: A | Discharge status: Home / Self Care | Payer: Medicaid Other | Attending: Emergency Medicine | Admitting: Emergency Medicine

## 2020-11-09 ENCOUNTER — Other Ambulatory Visit: Payer: Self-pay

## 2020-11-09 DIAGNOSIS — F17210 Nicotine dependence, cigarettes, uncomplicated: Secondary | ICD-10-CM | POA: Insufficient documentation

## 2020-11-09 DIAGNOSIS — Y9389 Activity, other specified: Secondary | ICD-10-CM | POA: Insufficient documentation

## 2020-11-09 DIAGNOSIS — W260XXA Contact with knife, initial encounter: Secondary | ICD-10-CM | POA: Insufficient documentation

## 2020-11-09 DIAGNOSIS — S61411A Laceration without foreign body of right hand, initial encounter: Secondary | ICD-10-CM

## 2020-11-09 MED ORDER — LIDOCAINE HCL (PF) 1 % IJ SOLN
5.0000 mL | Freq: Once | INTRAMUSCULAR | Status: AC
  Administered 2020-11-09: 5 mL via INTRADERMAL
  Filled 2020-11-09: qty 5

## 2020-11-09 MED ORDER — LIDOCAINE HCL (PF) 1 % IJ SOLN
10.0000 mL | Freq: Once | INTRAMUSCULAR | Status: AC
  Administered 2020-11-09: 10 mL
  Filled 2020-11-09: qty 10

## 2020-11-09 MED ORDER — CEPHALEXIN 500 MG PO CAPS: 500.0000 mg | ORAL_CAPSULE | Freq: Four times a day (QID) | ORAL | 0 refills | Status: AC

## 2020-11-09 MED ORDER — ALPRAZOLAM 0.5 MG PO TABS
0.5000 mg | ORAL_TABLET | Freq: Once | ORAL | Status: AC
  Administered 2020-11-09: 0.5 mg via ORAL
  Filled 2020-11-09: qty 1

## 2020-11-09 MED ORDER — CEPHALEXIN 500 MG PO CAPS
500.0000 mg | ORAL_CAPSULE | Freq: Once | ORAL | Status: AC
  Administered 2020-11-09: 500 mg via ORAL
  Filled 2020-11-09: qty 1

## 2020-11-09 NOTE — ED Provider Notes (Signed)
[facility] Emergency Department Provider Note  ____________________________________________   Event Date/Time   First MD Initiated Contact with Patient 11/09/20 1906     (approximate)  I have reviewed the triage vital signs and the nursing notes.   HISTORY  Chief Complaint Laceration    HPI Carolyn [name] is a 40 y.o. female presents emergency department complaining of a laceration to the palm of the right hand.  Patient states that 2 days ago she was using a chest knife to cut ribs when it slipped and cut her hand.  States she did not want to come to the ER that night because she had been drinking.  She packed the wound with gauze.  She states the area has still been draining and she is gotten very concerned.  Tdap is up-to-date.  Patient states she still can move her fingers as normal.    Past Medical History:  Diagnosis Date  . Anemia   . GERD (gastroesophageal reflux disease)   . Sickle cell trait [facility])     Patient Active Problem List   Diagnosis Date Noted  . Menorrhagia 03/20/2018  . Generalized anxiety disorder 11/28/2016  . Major depressive disorder, single episode, moderate ([place]) 11/28/2016  . Iron deficiency anemia, unspecified   . Gastric ulcer without hemorrhage or perforation but with obstruction   . Duodenal ulceration   . GI bleed due to NSAIDs   . Anemia 02/24/2016  . 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 Overweight 07/14/2015    Past Surgical History:  Procedure Laterality Date  . ESOPHAGOGASTRODUODENOSCOPY (EGD) WITH PROPOFOL N/A 02/26/2016   Procedure: ESOPHAGOGASTRODUODENOSCOPY (EGD) WITH PROPOFOL;  Surgeon: Lucilla Lame, MD;  Location: [facility] ENDOSCOPY;  Service: Endoscopy;  Laterality: N/A;    Prior to Admission medications   Medication Sig Start Date End Date Taking? Authorizing Provider  cephALEXin (KEFLEX) 500 MG capsule Take 1 capsule (500 mg total) by mouth 4 (four) times daily for 10 days. 11/09/20 11/19/20 Yes Kasper Mudrick, Linden Dolin,  PA-C  docusate sodium (COLACE) 100 MG capsule Take 1 capsule (100 mg total) by mouth 2 (two) times daily as needed for mild constipation. Patient not taking: No sig reported 06/26/20 06/26/21  Duffy Bruce, MD  ferrous sulfate 325 (65 FE) MG EC tablet Take 1 tablet (325 mg total) by mouth 2 (two) times daily. 06/26/20 09/24/20  Duffy Bruce, MD  norethindrone (MICRONOR) 0.35 MG tablet Take 1 tablet (0.35 mg total) by mouth daily. 11/07/20   Jerene Dilling, PA    Allergies Patient has no known allergies.  Family History  Problem Relation Age of Onset  . Diabetes Mother   . Hypertension Mother   . Lung cancer Father   . Lung cancer Maternal Grandmother   . Colon cancer Maternal Grandfather     Social History Social History   Tobacco Use  . Smoking status: Current Every Day Smoker    Packs/day: 0.25    Types: Cigarettes  . Smokeless tobacco: Never Used  Vaping Use  . Vaping Use: Never used  Substance Use Topics  . Alcohol use: Not Currently  . Drug use: Yes    Types: Marijuana    Review of Systems  Constitutional: No fever/chills Eyes: No visual changes. ENT: No sore throat. Respiratory: Denies cough Cardiovascular: Denies chest pain Gastrointestinal: Denies abdominal pain Genitourinary: Negative for dysuria. Musculoskeletal: Negative for back pain.  Positive right hand laceration Skin: Negative for rash. Psychiatric: no mood changes,     ____________________________________________   PHYSICAL  EXAM:  VITAL SIGNS: ED Triage Vitals  Enc Vitals Group     BP 11/09/20 1759 124/85     Pulse Rate 11/09/20 1759 (!) 107     Resp 11/09/20 1759 20     Temp 11/09/20 1759 98.3 F (36.8 C)     Temp Source 11/09/20 1759 Oral     SpO2 11/09/20 1759 99 %     Weight 11/09/20 1800 175 lb (79.4 kg)     Height 11/09/20 1800 5\' 5"  (1.651 m)     Head Circumference --      Peak Flow --      Pain Score 11/09/20 1800 10     Pain Loc --      Pain Edu? --      Excl. in [place]?  --     Constitutional: Alert and oriented. Well appearing and in no acute distress. Eyes: Conjunctivae are normal.  Head: Atraumatic. Nose: No congestion/rhinnorhea. Mouth/Throat: Mucous membranes are moist.  Neck:  supple no lymphadenopathy noted Cardiovascular: Normal rate, regular rhythm.  Respiratory: Normal respiratory effort.  No retractions,  GU: deferred Musculoskeletal: FROM all extremities, warm and well perfused, right hand has a very deep laceration ranging from the dorsum at the base of the thumb around the palm extending through the thenar muscle, no foreign body is noted, no tendon involvement is noted, joint capsule is not involved Neurologic:  Normal speech and language.  Skin:  Skin is warm, dry  No rash noted. Psychiatric: Mood and affect are normal. Speech and behavior are normal.  ____________________________________________   LABS (all labs ordered are listed, but only abnormal results are displayed)  Labs Reviewed - No data to display ____________________________________________   ____________________________________________  RADIOLOGY    ____________________________________________   PROCEDURES  Procedure(s) performed:   Marland KitchenMarland KitchenLaceration Repair  Date/Time: 11/09/2020 9:44 PM Performed by: Versie Starks, PA-C Authorized by: Versie Starks, PA-C   Consent:    Consent obtained:  Verbal   Consent given by:  Patient   Risks, benefits, and alternatives were discussed: yes     Risks discussed:  Infection, need for additional repair, poor cosmetic result, tendon damage, retained foreign body, pain and poor wound healing Universal protocol:    Procedure explained and questions answered to patient or proxy's satisfaction: yes     Patient identity confirmed:  Verbally with patient Anesthesia:    Anesthesia method:  Local infiltration   Local anesthetic:  Lidocaine 1% w/o epi Laceration details:    Location:  Hand   Hand location:  R palm   Length  (cm):  7.5 Pre-procedure details:    Preparation:  Patient was prepped and draped in usual sterile fashion Exploration:    Limited defect created (wound extended): no     Hemostasis achieved with:  Direct pressure   Imaging outcome: foreign body noted     Wound exploration: wound explored through full range of motion     Wound extent: foreign bodies/material     Wound extent: no muscle damage noted, no nerve damage noted, no tendon damage noted, no underlying fracture noted and no vascular damage noted     Foreign bodies/material:  Fibers Treatment:    Area cleansed with:  Povidone-iodine   Amount of cleaning:  Extensive   Irrigation solution:  Sterile saline   Irrigation method:  Pressure wash, syringe and tap   Visualized foreign bodies/material removed: yes     Layers/structures repaired:  Deep subcutaneous Deep subcutaneous:    Suture  size:  5-0   Suture material:  Vicryl   Suture technique:  Simple interrupted   Number of sutures:  5 Skin repair:    Repair method:  Sutures   Suture size:  5-0   Suture material:  Nylon   Suture technique:  Simple interrupted   Number of sutures:  5 Approximation:    Approximation:  Loose Repair type:    Repair type:  Intermediate Post-procedure details:    Dressing:  Non-adherent dressing and splint for protection   Procedure completion:  Tolerated well, no immediate complications Comments:     Sutures by Barry Brunner under my supervision      ____________________________________________   INITIAL IMPRESSION / ASSESSMENT AND PLAN / ED COURSE  Pertinent labs & imaging results that were available during my care of the patient were reviewed by me and considered in my medical decision making (see chart for details).   Patient is 40 year old female presents laceration to the right hand.  See HPI.  Physical exam shows a very deep large laceration.  During procedure see procedure note, fibers were removed, no other foreign body  noted, no tendon involvement  See procedure note for repair  The patient was placed in a thumb spica splint to protect the wound.  She was also given Keflex 500 mg p.o. while here in the ED.  A prescription for Keflex 500 mg 4 times daily for 7 days.  She is to follow-up with orthopedics for recheck.  Return emergency department 7 to 10 days for suture removal.  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  She was given a work note stating the same.  She is to return the emergency department if any sign of infection.  She is discharged in stable condition.     Carolyn [name] was evaluated in Emergency Department on 11/09/2020 for the symptoms described in the history of present illness. She was evaluated in the context of the global COVID-19 pandemic, which necessitated consideration that the patient might be at risk for infection with the SARS-CoV-2 virus that causes COVID-19. Institutional protocols and algorithms that pertain to the evaluation of patients at risk for COVID-19 are in a state of rapid change based on information released by regulatory bodies including the CDC and federal and state organizations. These policies and algorithms were followed during the patient's care in the ED.    As part of my medical decision making, I reviewed the following data within the [place] notes reviewed and incorporated, Old chart reviewed, Notes from prior ED visits and [place] Controlled Substance Database  ____________________________________________   FINAL CLINICAL IMPRESSION(S) / ED DIAGNOSES  Final diagnoses:  Laceration of right hand without foreign body, initial encounter      NEW MEDICATIONS STARTED DURING THIS VISIT:  New Prescriptions   CEPHALEXIN (KEFLEX) 500 MG CAPSULE    Take 1 capsule (500 mg total) by mouth 4 (four) times daily for 10 days.     Note:  This document was prepared  using Dragon voice recognition software and may include unintentional dictation errors.    Versie Starks, PA-C 11/09/20 2150    Naaman Plummer, MD 11/09/20 2239

## 2020-11-09 NOTE — ED Notes (Signed)
Attempted to removed pt's dressing to assess wound. Pt's dressing is stuck inside of the laceration. Hand soaking in water at this moment to attempt to remove dressing.

## 2020-11-09 NOTE — ED Notes (Signed)
Non-adherent dressing applied to sutures. Thumb spica splint applied over dressing. Patient placed in sling. Patient instructed on home wound/care and splint care.

## 2020-11-09 NOTE — ED Triage Notes (Addendum)
Pt via POV from home. Pt cut her the palm of her R hand with a chef knife on Saturday. Pt is A&Ox4 and NAD. Very anxious on arrival

## 2020-11-09 NOTE — Discharge Instructions (Signed)
Wear the thumb spica splint for 2 to 3 days. 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

## 2020-11-24 ENCOUNTER — Encounter: Payer: Self-pay | Admitting: Student

## 2020-11-24 LAB — HM HIV SCREENING LAB: HM HIV Screening: NEGATIVE

## 2020-11-24 LAB — HM HEPATITIS C SCREENING LAB: HM Hepatitis Screen: NEGATIVE

## 2021-02-11 ENCOUNTER — Encounter: Payer: Self-pay | Admitting: *Deleted

## 2021-02-11 ENCOUNTER — Ambulatory Visit: Payer: Medicaid Other

## 2021-02-11 ENCOUNTER — Emergency Department: Payer: Self-pay

## 2021-02-11 ENCOUNTER — Observation Stay
Admission: EM | Admit: 2021-02-11 | Discharge: 2021-02-13 | Disposition: A | Discharge status: Home / Self Care | Payer: Self-pay | Attending: Emergency Medicine | Admitting: Emergency Medicine

## 2021-02-11 ENCOUNTER — Other Ambulatory Visit: Payer: Self-pay

## 2021-02-11 DIAGNOSIS — F17210 Nicotine dependence, cigarettes, uncomplicated: Secondary | ICD-10-CM | POA: Insufficient documentation

## 2021-02-11 DIAGNOSIS — N939 Abnormal uterine and vaginal bleeding, unspecified: Secondary | ICD-10-CM | POA: Diagnosis present

## 2021-02-11 DIAGNOSIS — D251 Intramural leiomyoma of uterus: Secondary | ICD-10-CM

## 2021-02-11 DIAGNOSIS — D500 Iron deficiency anemia secondary to blood loss (chronic): Secondary | ICD-10-CM | POA: Diagnosis present

## 2021-02-11 DIAGNOSIS — D62 Acute posthemorrhagic anemia: Secondary | ICD-10-CM

## 2021-02-11 DIAGNOSIS — Z79899 Other long term (current) drug therapy: Secondary | ICD-10-CM | POA: Insufficient documentation

## 2021-02-11 DIAGNOSIS — R102 Pelvic and perineal pain: Secondary | ICD-10-CM

## 2021-02-11 DIAGNOSIS — Z8719 Personal history of other diseases of the digestive system: Secondary | ICD-10-CM

## 2021-02-11 DIAGNOSIS — D649 Anemia, unspecified: Principal | ICD-10-CM | POA: Insufficient documentation

## 2021-02-11 DIAGNOSIS — D509 Iron deficiency anemia, unspecified: Secondary | ICD-10-CM | POA: Diagnosis present

## 2021-02-11 DIAGNOSIS — Z8711 Personal history of peptic ulcer disease: Secondary | ICD-10-CM

## 2021-02-11 DIAGNOSIS — R1030 Lower abdominal pain, unspecified: Secondary | ICD-10-CM

## 2021-02-11 DIAGNOSIS — Z20822 Contact with and (suspected) exposure to covid-19: Secondary | ICD-10-CM | POA: Insufficient documentation

## 2021-02-11 DIAGNOSIS — R9389 Abnormal findings on diagnostic imaging of other specified body structures: Secondary | ICD-10-CM

## 2021-02-11 LAB — CBC
HCT: 24.3 % — ABNORMAL LOW (ref 36.0–46.0)
Hemoglobin: 6.4 g/dL — ABNORMAL LOW (ref 12.0–15.0)
MCH: 16.4 pg — ABNORMAL LOW (ref 26.0–34.0)
MCHC: 26.3 g/dL — ABNORMAL LOW (ref 30.0–36.0)
MCV: 62.3 fL — ABNORMAL LOW (ref 80.0–100.0)
Platelets: 340 10*3/uL (ref 150–400)
RBC: 3.9 MIL/uL (ref 3.87–5.11)
RDW: 22.6 % — ABNORMAL HIGH (ref 11.5–15.5)
WBC: 10.2 10*3/uL (ref 4.0–10.5)
nRBC: 0 % (ref 0.0–0.2)

## 2021-02-11 LAB — URINALYSIS, COMPLETE (UACMP) WITH MICROSCOPIC
Bacteria, UA: NONE SEEN
Bilirubin Urine: NEGATIVE
Glucose, UA: NEGATIVE mg/dL
Ketones, ur: NEGATIVE mg/dL
Nitrite: NEGATIVE
Protein, ur: NEGATIVE mg/dL
Specific Gravity, Urine: 1.021 (ref 1.005–1.030)
pH: 7 (ref 5.0–8.0)

## 2021-02-11 LAB — BASIC METABOLIC PANEL
Anion gap: 6 (ref 5–15)
BUN: 14 mg/dL (ref 6–20)
CO2: 27 mmol/L (ref 22–32)
Calcium: 9 mg/dL (ref 8.9–10.3)
Chloride: 106 mmol/L (ref 98–111)
Creatinine, Ser: 0.73 mg/dL (ref 0.44–1.00)
GFR, Estimated: >60 mL/min (ref >60)
Glucose, Bld: 108 mg/dL — ABNORMAL HIGH (ref 70–99)
Potassium: 3.5 mmol/L (ref 3.5–5.1)
Sodium: 139 mmol/L (ref 135–145)

## 2021-02-11 LAB — POC URINE PREG, ED: Preg Test, Ur: NEGATIVE

## 2021-02-11 LAB — HEPATIC FUNCTION PANEL
ALT: 10 U/L (ref 0–44)
AST: 16 U/L (ref 15–41)
Albumin: 3.8 g/dL (ref 3.5–5.0)
Alkaline Phosphatase: 54 U/L (ref 38–126)
Bilirubin, Direct: <0.1 mg/dL (ref 0.0–0.2)
Total Bilirubin: 0.5 mg/dL (ref 0.3–1.2)
Total Protein: 7.1 g/dL (ref 6.5–8.1)

## 2021-02-11 LAB — LIPASE, BLOOD: Lipase: 63 U/L — ABNORMAL HIGH (ref 11–51)

## 2021-02-11 MED ORDER — SODIUM CHLORIDE 0.9 % IV BOLUS (SEPSIS)
1000.0000 mL | Freq: Once | INTRAVENOUS | Status: AC
  Administered 2021-02-11: 1000 mL via INTRAVENOUS

## 2021-02-11 MED ORDER — MORPHINE SULFATE (PF) 4 MG/ML IV SOLN
4.0000 mg | Freq: Once | INTRAVENOUS | Status: AC
  Administered 2021-02-12: 4 mg via INTRAVENOUS
  Filled 2021-02-11: qty 1

## 2021-02-11 MED ORDER — KETOROLAC TROMETHAMINE 30 MG/ML IJ SOLN
30.0000 mg | Freq: Once | INTRAMUSCULAR | Status: AC
  Administered 2021-02-11: 30 mg via INTRAVENOUS
  Filled 2021-02-11: qty 1

## 2021-02-11 NOTE — ED Triage Notes (Addendum)
Left sided flank pain starting today with blood reported in her urine. No hx of kidney stones.   Pt also reports since the pain started she has felt fatigued, dizzy and lightheaded. Swelling noted in her ankles starting yesterday.

## 2021-02-11 NOTE — ED Notes (Signed)
Patient taken to CT.

## 2021-02-11 NOTE — ED Provider Notes (Signed)
[facility] Emergency Department Provider Note  ____________________________________________   Event Date/Time   First MD Initiated Contact with Patient 02/11/21 2255     (approximate)  I have reviewed the triage vital signs and the nursing notes.   HISTORY  Chief Complaint Flank Pain    HPI Carolyn [name] is a 40 y.o. female who presents to the ED with complaints of gradual onset L flank, L abdominal pain x 1 day that radiates across the lower back.  Described as sharp and gradual in onset.  No fever, N/V/D.  Has had dysuria and blood with wiping but she is not sure if it is hematuria or vaginal bleeding.  No vaginal discharge.  No h/o previous kidney stone but states her daughter recently had one.  No A/A factors.  C/o also feeling lightheaded.        Past Medical History:  Diagnosis Date  . Anemia   . GERD (gastroesophageal reflux disease)   . Sickle cell trait [facility])     Patient Active Problem List   Diagnosis Date Noted  . Menorrhagia 03/20/2018  . Generalized anxiety disorder 11/28/2016  . Major depressive disorder, single episode, moderate ([place]) 11/28/2016  . Iron deficiency anemia, unspecified   . Gastric ulcer without hemorrhage or perforation but with obstruction   . Duodenal ulceration   . GI bleed due to NSAIDs   . Anemia 02/24/2016  . 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 Overweight 07/14/2015    Past Surgical History:  Procedure Laterality Date  . ESOPHAGOGASTRODUODENOSCOPY (EGD) WITH PROPOFOL N/A 02/26/2016   Procedure: ESOPHAGOGASTRODUODENOSCOPY (EGD) WITH PROPOFOL;  Surgeon: Lucilla Lame, MD;  Location: [facility] ENDOSCOPY;  Service: Endoscopy;  Laterality: N/A;    Prior to Admission medications   Medication Sig Start Date End Date Taking? Authorizing Provider  docusate sodium (COLACE) 100 MG capsule Take 1 capsule (100 mg total) by mouth 2 (two) times daily as needed for mild constipation. Patient not taking: No sig reported 06/26/20  06/26/21  Duffy Bruce, MD  ferrous sulfate 325 (65 FE) MG EC tablet Take 1 tablet (325 mg total) by mouth 2 (two) times daily. 06/26/20 09/24/20  Duffy Bruce, MD  norethindrone (MICRONOR) 0.35 MG tablet Take 1 tablet (0.35 mg total) by mouth daily. 11/07/20   Jerene Dilling, PA    Allergies Nsaids  Family History  Problem Relation Age of Onset  . Diabetes Mother   . Hypertension Mother   . Lung cancer Father   . Lung cancer Maternal Grandmother   . Colon cancer Maternal Grandfather     Social History Social History   Tobacco Use  . Smoking status: Current Every Day Smoker    Packs/day: 0.25    Types: Cigarettes  . Smokeless tobacco: Never Used  Vaping Use  . Vaping Use: Never used  Substance Use Topics  . Alcohol use: Not Currently  . Drug use: Yes    Types: Marijuana    Review of Systems Constitutional: No fever. Eyes: No visual changes. ENT: No sore throat. Cardiovascular: Denies chest pain. Respiratory: Denies shortness of breath. Gastrointestinal: No nausea, vomiting, diarrhea. Genitourinary: + for dysuria. Musculoskeletal: + for back pain. Skin: Negative for rash. Neurological: Negative for focal weakness or numbness.  ____________________________________________   PHYSICAL EXAM:  VITAL SIGNS: ED Triage Vitals  Enc Vitals Group     BP 02/11/21 2220 (!) 83/66     Pulse Rate 02/11/21 2220 96     Resp 02/11/21 2220 16  Temp 02/11/21 2220 98.4 F (36.9 C)     Temp Source 02/11/21 2220 Oral     SpO2 02/11/21 2220 100 %     Weight 02/11/21 2221 175 lb 0.7 oz (79.4 kg)     Height 02/11/21 2221 5\' 5"  (1.651 m)     Head Circumference --      Peak Flow --      Pain Score 02/11/21 2221 10     Pain Loc --      Pain Edu? --      Excl. in [place]? --    CONSTITUTIONAL: Alert and oriented and responds appropriately to questions. Well-appearing; well-nourished HEAD: Normocephalic EYES: Conjunctivae clear, pupils appear equal, EOM appear intact ENT:  normal nose; moist mucous membranes NECK: Supple, normal ROM CARD: RRR; S1 and S2 appreciated; no murmurs, no clicks, no rubs, no gallops RESP: Normal chest excursion without splinting or tachypnea; breath sounds clear and equal bilaterally; no wheezes, no rhonchi, no rales, no hypoxia or respiratory distress, speaking full sentences ABD/GI: Normal bowel sounds; non-distended; soft, mildly TTP throughout left abdomen, no tenderness at McBurney's point, no rebound, no guarding, no peritoneal signs, no hepatosplenomegaly GU:  Normal external genitalia. No lesions, rashes noted. Patient has no active vaginal bleeding on exam.  No vaginal discharge.  No adnexal tenderness, mass or fullness, no cervical motion tenderness. Cervix is not appear friable.  Cervix is closed.  Chaperone present for exam. RECTAL:  Normal rectal tone, no gross blood or melena, guaiac negative, no hemorrhoids appreciated, nontender rectal exam, no fecal impaction. Chaperone present. BACK: The back appears normal, no CVA tenderness, no midline spinal tenderness or stepoff or deformity EXT: Normal ROM in all joints; no deformity noted, no edema; no cyanosis SKIN: Normal color for age and race; warm; no rash on exposed skin NEURO: Moves all extremities equally PSYCH: The patient's mood and manner are appropriate.  ____________________________________________   LABS (all labs ordered are listed, but only abnormal results are displayed)  Labs Reviewed  WET PREP, GENITAL - Abnormal; Notable for the following components:      Result Value   Clue Cells Wet Prep HPF POC PRESENT (*)    WBC, Wet Prep HPF POC RARE (*)    All other components within normal limits  URINALYSIS, COMPLETE (UACMP) WITH MICROSCOPIC - Abnormal; Notable for the following components:   Color, Urine YELLOW (*)    APPearance HAZY (*)    Hgb urine dipstick MODERATE (*)    Leukocytes,Ua SMALL (*)    All other components within normal limits  BASIC METABOLIC  PANEL - Abnormal; Notable for the following components:   Glucose, Bld 108 (*)    All other components within normal limits  CBC - Abnormal; Notable for the following components:   Hemoglobin 6.4 (*)    HCT 24.3 (*)    MCV 62.3 (*)    MCH 16.4 (*)    MCHC 26.3 (*)    RDW 22.6 (*)    All other components within normal limits  LIPASE, BLOOD - Abnormal; Notable for the following components:   Lipase 63 (*)    All other components within normal limits  URINE CULTURE  CHLAMYDIA/NGC RT PCR ([facility] ONLY)  RESP PANEL BY RT-PCR (FLU A&B, COVID) ARPGX2  HEPATIC FUNCTION PANEL  PROTIME-INR  POC URINE PREG, ED  TYPE AND SCREEN  PREPARE RBC (CROSSMATCH)   ____________________________________________  EKG   ____________________________________________  RADIOLOGY I, Tavie Haseman, personally viewed and evaluated these images (plain  radiographs) as part of my medical decision making, as well as reviewing the written report by the radiologist.  ED MD interpretation: No kidney stone.  Fibroid uterus.  Official radiology report(s): CT Renal Stone Study  Result Date: 02/11/2021 CLINICAL DATA:  Left-sided flank pain EXAM: CT ABDOMEN AND PELVIS WITHOUT CONTRAST TECHNIQUE: Multidetector CT imaging of the abdomen and pelvis was performed following the standard protocol without IV contrast. COMPARISON:  CT 06/29/2017 FINDINGS: Lower chest: Lung bases demonstrate no acute consolidation or effusion. Normal cardiac size Hepatobiliary: Calcified gallstones. No focal hepatic abnormality. No biliary dilatation. Pancreas: Unremarkable. No pancreatic ductal dilatation or surrounding inflammatory changes. Spleen: Normal in size without focal abnormality. Adrenals/Urinary Tract: Adrenal glands are unremarkable. Kidneys are normal, without renal calculi, focal lesion, or hydronephrosis. Bladder is unremarkable. Stomach/Bowel: Stomach is within normal limits. Appendix appears normal. No evidence of bowel  wall thickening, distention, or inflammatory changes. Vascular/Lymphatic: No significant vascular findings are present. No enlarged abdominal or pelvic lymph nodes. Reproductive: Uterus unremarkable.  4.2 cm right adnexal cyst. Other: Negative for free air or free fluid Musculoskeletal: No acute or significant osseous findings. IMPRESSION: 1. No CT evidence for acute intra-abdominal or pelvic abnormality. 2. Gallstones 3. 4.2 cm right adnexal cyst. No follow-up imaging recommended. Note: This recommendation does not apply to premenarchal patients and to those with increased risk (genetic, family history, elevated tumor markers or other high-risk factors) of ovarian cancer. Reference: JACR 2020 Feb; 17(2):248-254 Electronically Signed   By: Donavan Foil M.D.   On: 02/11/2021 23:44   US PELVIC COMPLETE W TRANSVAGINAL AND TORSION R/O  Result Date: 02/12/2021 CLINICAL DATA:  Initial evaluation for acute pelvic pain, vaginal bleeding. EXAM: TRANSABDOMINAL AND TRANSVAGINAL ULTRASOUND OF PELVIS DOPPLER ULTRASOUND OF OVARIES TECHNIQUE: Both transabdominal and transvaginal ultrasound examinations of the pelvis were performed. Transabdominal technique was performed for global imaging of the pelvis including uterus, ovaries, adnexal regions, and pelvic cul-de-sac. It was necessary to proceed with endovaginal exam following the transabdominal exam to visualize the uterus, endometrium, and ovaries. Color and duplex Doppler ultrasound was utilized to evaluate blood flow to the ovaries. COMPARISON:  Prior CT from 02/11/2021 FINDINGS: Uterus Measurements: 9.3 x 6.2 x 7.0 cm = volume: 212 mL. Uterus is retroflexed. Multiple uterine fibroids are seen, largest of which are measured: 1. 2.0 x 2.3 x 1.9 cm intramural fibroid present at the right posterior uterine body. 2. 2.9 x 2.9 x 2.0 cm intramural fibroid at the adjacent right posterior uterine body. 3. 3.7 x 3.2 x 2.8 cm intramural fibroid at the central uterine fundus.  Endometrium Thickness: 16 mm. Heterogeneous echotexture without definite focal lesion. Associated vascularity seen with color Doppler imaging. Right ovary Measurements: 4.6 x 3.4 x 3.4 cm = volume: 28.0 mL. 3.1 x 3.0 x 2.4 cm simple cyst. No internal complexity, vascularity, or solid nodularity. No other adnexal mass. Left ovary Measurements: 3.2 x 2.3 x 1.9 cm = volume: 7.0 mL. Normal appearance/no adnexal mass. Pulsed Doppler evaluation of both ovaries demonstrates normal low-resistance arterial and venous waveforms. Other findings No abnormal free fluid. IMPRESSION: 1. Enlarged fibroid uterus as detailed above. 2. Endometrial stripe thickened up to 16 mm with heterogeneous echotexture and associated vascularity. If bleeding remains unresponsive to hormonal or medical therapy, focal lesion work-up with sonohysterogram should be considered. Endometrial biopsy should also be considered in pre-menopausal patients at high risk for endometrial carcinoma. (Ref: Radiological Reasoning: Algorithmic Workup of Abnormal Vaginal Bleeding with Endovaginal Sonography and Sonohysterography. AJR [id number]). 3. 3.1 cm  simple right ovarian cyst, likely benign. No follow up imaging recommended. Note: This recommendation does not apply to premenarchal patients or to those with increased risk (genetic, family history, elevated tumor markers or other high-risk factors) of ovarian cancer. Reference: Radiology 2019 Nov; 293(2):359-371. 4. No evidence for ovarian torsion or other acute abnormality. Electronically Signed   By: Jeannine Boga M.D.   On: 02/12/2021 01:21    ____________________________________________   PROCEDURES  Procedure(s) performed (including Critical Care):  Procedures  CRITICAL CARE Performed by: Cyril Mourning Jeriann Sayres   Total critical care time: 45 minutes  Critical care time was exclusive of separately billable procedures and treating other patients.  Critical care was necessary to treat or  prevent imminent or life-threatening deterioration.  Critical care was time spent personally by me on the following activities: development of treatment plan with patient and/or surrogate as well as nursing, discussions with consultants, evaluation of patient's response to treatment, examination of patient, obtaining history from patient or surrogate, ordering and performing treatments and interventions, ordering and review of laboratory studies, ordering and review of radiographic studies, pulse oximetry and re-evaluation of patient's condition.  ____________________________________________   INITIAL IMPRESSION / ASSESSMENT AND PLAN / ED COURSE  As part of my medical decision making, I reviewed the following data within the [place] notes reviewed and incorporated, Labs reviewed , Old chart reviewed, Radiograph reviewed , Discussed with admitting physician  and Notes from prior ED visits         Patient here with left abdominal pain, left flank pain.  DDx includes kidney stone, pyelonephritis, UTI, diverticulitis, colitis, TOA, PID, torsion, MSK pain.  UPT negative.  Will give toradol and IVF.  Will obtain labs, urine, urine culture, CT renal.  ED PROGRESS  Patient reports some improvement in her pain.  Her labs show a hemoglobin of 6.4.  She has history of anemia and has had blood transfusions before saying the last was about 4 years ago.  She is not on iron tablets at this time.  Has history of sickle cell trait but not sickle cell anemia.  Her urine does not appear infected today.  Her CT scan shows gallstones and a right adnexal cyst but no other acute abnormality.  No kidney stones.  Appendix is normal.  No signs of pyelonephritis.  Patient denies having any melena but states she is not sure if the blood she is seeing with wiping is from her urine, vagina or rectum.  She does state that she still clots today with wiping.  She reports for several days she has felt  lightheaded and tired but no chest pain or shortness of breath.  We will transfuse 2 units of packed red blood cells given symptomatic anemia here.  She did have 1 documented low blood pressure but this has resolved and she is currently getting IV fluids.  She is guaiac negative today.  Stool is brown in appearance, no melena or hematochezia.  No signs of active vaginal bleeding.  She states her last menstrual period was 01/28/2021 and it is abnormal for her to bleed irregularly.  Given lower abdominal pain possible vaginal bleeding, will obtain pelvic ultrasound.  Urine also shows no hematuria.  Discussed with patient that she will need admission to the hospital.  1:27 AM  Pt's ultrasound shows a large fibroid uterus.  Endometrial stripe is thickened with heterogeneous echotexture and associated vascularity.  She is not actively bleeding at this time and it is very difficult to  determine if blood was from urine, vagina or rectum.  Will discuss with hospitalist for admission for symptomatic anemia and further monitoring for any signs of bleeding.  2:14 AM Discussed patient's case with hospitalist, Dr. Damita Dunnings.  I have recommended admission and patient (and family if present) agree with this plan. Admitting physician will place admission orders.   I reviewed all nursing notes, vitals, pertinent previous records and reviewed/interpreted all EKGs, lab and urine results, imaging (as available).   ____________________________________________   FINAL CLINICAL IMPRESSION(S) / ED DIAGNOSES  Final diagnoses:  Symptomatic anemia  Lower abdominal pain     ED Discharge Orders    None      *Please note:  Dorothe D Boden was evaluated in Emergency Department on 02/12/2021 for the symptoms described in the history of present illness. She was evaluated in the context of the global COVID-19 pandemic, which necessitated consideration that the patient might be at risk for infection with the SARS-CoV-2 virus  that causes COVID-19. Institutional protocols and algorithms that pertain to the evaluation of patients at risk for COVID-19 are in a state of rapid change based on information released by regulatory bodies including the CDC and federal and state organizations. These policies and algorithms were followed during the patient's care in the ED.  Some ED evaluations and interventions may be delayed as a result of limited staffing during and the pandemic.*   Note:  This document was prepared using Dragon voice recognition software and may include unintentional dictation errors.   Casper Pagliuca, Delice Bison, DO 02/12/21 0215

## 2021-02-11 NOTE — ED Notes (Signed)
ED Provider at bedside. 

## 2021-02-11 NOTE — ED Provider Notes (Signed)
Emergency Medicine Provider Triage Evaluation Note  Carolyn [name] , a 40 y.o. female  was evaluated in triage.  Pt complains of left flank pain along with vaginal bleeding versus hematuria.  Review of Systems  Positive: Flank pain, vaginal bleeding, hematuria Negative: Nausea, vomiting, abdominal pain, fever, dysuria  Physical Exam  BP 138/89   Pulse 88   Temp 98.4 F (36.9 C) (Oral)   Resp 18   Ht 5\' 5"  (1.651 m)   Wt 79.4 kg   LMP 01/28/2021   SpO2 100%   BMI 29.13 kg/m  Gen:   Awake, no distress   Resp:  Normal effort  MSK:   Moves extremities without difficulty  Other:  CVA tenderness noted on left.  Medical Decision Making  Medically screening exam initiated at 10:44 PM.  Appropriate orders placed.  Carolyn [name] was informed that the remainder of the evaluation will be completed by another provider, this initial triage assessment does not replace that evaluation, and the importance of remaining in the ED until their evaluation is complete.    Blake Divine, MD 02/11/21 [phone]

## 2021-02-11 NOTE — ED Notes (Signed)
Pelvic setup at bedside.

## 2021-02-12 ENCOUNTER — Encounter: Payer: Self-pay | Admitting: Family Medicine

## 2021-02-12 ENCOUNTER — Emergency Department: Payer: Self-pay

## 2021-02-12 DIAGNOSIS — Z8719 Personal history of other diseases of the digestive system: Secondary | ICD-10-CM

## 2021-02-12 DIAGNOSIS — Z8711 Personal history of peptic ulcer disease: Secondary | ICD-10-CM

## 2021-02-12 DIAGNOSIS — R9389 Abnormal findings on diagnostic imaging of other specified body structures: Secondary | ICD-10-CM

## 2021-02-12 DIAGNOSIS — D62 Acute posthemorrhagic anemia: Secondary | ICD-10-CM

## 2021-02-12 DIAGNOSIS — N939 Abnormal uterine and vaginal bleeding, unspecified: Secondary | ICD-10-CM | POA: Diagnosis present

## 2021-02-12 DIAGNOSIS — D649 Anemia, unspecified: Secondary | ICD-10-CM

## 2021-02-12 DIAGNOSIS — D251 Intramural leiomyoma of uterus: Secondary | ICD-10-CM

## 2021-02-12 LAB — WET PREP, GENITAL
Sperm: NONE SEEN
Trich, Wet Prep: NONE SEEN
Yeast Wet Prep HPF POC: NONE SEEN

## 2021-02-12 LAB — HIV ANTIBODY (ROUTINE TESTING W REFLEX): HIV Screen 4th Generation wRfx: NONREACTIVE

## 2021-02-12 LAB — IRON AND TIBC
Iron: 54 ug/dL (ref 28–170)
Saturation Ratios: 11 % (ref 10.4–31.8)
TIBC: 489 ug/dL — ABNORMAL HIGH (ref 250–450)
UIBC: 435 ug/dL

## 2021-02-12 LAB — CBC
HCT: 27 % — ABNORMAL LOW (ref 36.0–46.0)
Hemoglobin: 7.9 g/dL — ABNORMAL LOW (ref 12.0–15.0)
MCH: 19.7 pg — ABNORMAL LOW (ref 26.0–34.0)
MCHC: 29.3 g/dL — ABNORMAL LOW (ref 30.0–36.0)
MCV: 67.3 fL — ABNORMAL LOW (ref 80.0–100.0)
Platelets: 265 10*3/uL (ref 150–400)
RBC: 4.01 MIL/uL (ref 3.87–5.11)
RDW: 26.5 % — ABNORMAL HIGH (ref 11.5–15.5)
WBC: 8.4 10*3/uL (ref 4.0–10.5)
nRBC: 0 % (ref 0.0–0.2)

## 2021-02-12 LAB — CHLAMYDIA/NGC RT PCR (ARMC ONLY)
Chlamydia Tr: NOT DETECTED
N gonorrhoeae: NOT DETECTED

## 2021-02-12 LAB — PROTIME-INR
INR: 1 (ref 0.8–1.2)
Prothrombin Time: 12.9 seconds (ref 11.4–15.2)

## 2021-02-12 LAB — RESP PANEL BY RT-PCR (FLU A&B, COVID) ARPGX2
Influenza A by PCR: NEGATIVE
Influenza B by PCR: NEGATIVE
SARS Coronavirus 2 by RT PCR: NEGATIVE

## 2021-02-12 MED ORDER — SODIUM CHLORIDE 0.9% IV SOLUTION
Freq: Once | INTRAVENOUS | Status: AC
  Filled 2021-02-12: qty 250

## 2021-02-12 MED ORDER — ONDANSETRON HCL 4 MG PO TABS: 4.0000 mg | ORAL_TABLET | Freq: Four times a day (QID) | ORAL | Status: DC | PRN

## 2021-02-12 MED ORDER — SODIUM CHLORIDE 0.9 % IV SOLN
10.0000 mL/h | Freq: Once | INTRAVENOUS | Status: AC
  Administered 2021-02-12: 10 mL/h via INTRAVENOUS

## 2021-02-12 MED ORDER — ONDANSETRON HCL 4 MG/2ML IJ SOLN: 4.0000 mg | Freq: Four times a day (QID) | INTRAMUSCULAR | Status: DC | PRN

## 2021-02-12 MED ORDER — MEDROXYPROGESTERONE ACETATE 10 MG PO TABS
20.0000 mg | ORAL_TABLET | Freq: Every day | ORAL | Status: DC
  Administered 2021-02-12 – 2021-02-13 (×2): 20 mg via ORAL
  Filled 2021-02-12 (×2): qty 2

## 2021-02-12 MED ORDER — ACETAMINOPHEN 650 MG RE SUPP: 650.0000 mg | Freq: Four times a day (QID) | RECTAL | Status: DC | PRN

## 2021-02-12 MED ORDER — ACETAMINOPHEN 325 MG PO TABS
650.0000 mg | ORAL_TABLET | Freq: Four times a day (QID) | ORAL | Status: DC | PRN
  Administered 2021-02-13: 650 mg via ORAL
  Filled 2021-02-12: qty 2

## 2021-02-12 MED ORDER — MORPHINE SULFATE (PF) 2 MG/ML IV SOLN: 2.0000 mg | INTRAVENOUS | Status: DC | PRN

## 2021-02-12 MED ORDER — PANTOPRAZOLE SODIUM 40 MG IV SOLR
40.0000 mg | INTRAVENOUS | Status: DC
  Administered 2021-02-12: 40 mg via INTRAVENOUS
  Filled 2021-02-12: qty 40

## 2021-02-12 NOTE — ED Notes (Signed)
Patient taken to ultrasound.

## 2021-02-12 NOTE — Progress Notes (Signed)
  Patient seen and examined  40 year old G1, P1-prior GI bleed with hiatal hernia on endoscopy 2017 secondary to NSAIDs Has had notable blood while wiping after passing urine Goes to the health department has regular physicals but has not had any specific women's follow-up Does not use contraceptives-has used Depo-Provera in the past Usual menstruation is every 28 days does not typically have breakthrough bleeding--finished her last menses about 02/05/2021  Developed over the past several days fatigue lethargy ED work-up revealed hemoglobin 6.4 down from 7 Also found to have clue cells on wet prep CT abdomen pelvis showed fibroid uterus with thickened endometrial stripe simple right ovarian cyst She was hypotensive and therefore transfused 1 unit PRBC  In discussion with her in more detail she tells me she has not had any specific dark or tarry stool at all over the past several days and in fact has been significantly constipated because she has been eating too much cheese  I have spoken with Dr. Reece Levy of GI and told her that I will start a regular diet a little later this morning   will discuss with OB/GYN later this morning for recommendations-if she stabilizes and has no further bleeding she may benefit from either Provera or mifepristone to prevent bleeding but we will keep her overnight to observe her to ensure that this does not occur during hospital stay  I will get iron studies and probably give IV iron if her stores are low  No charge  Verneita Griffes, MD [facility] 7:55 AM

## 2021-02-12 NOTE — ED Notes (Signed)
Pt has been given 2 warm blankets.

## 2021-02-12 NOTE — ED Notes (Signed)
Dr. Duncan at bedside 

## 2021-02-12 NOTE — Consult Note (Signed)
Obstetrics & Gynecology Consult H&P   Consulting Department: Medicine  Consulting Physician: Nita Sells, MD  Consulting Question: [name], Carolyn [name]   History of Present Illness: Carolyn [name] is a 40 y.o. G1P1001 currently admitted to the hospital for blood transfusion.  Carolyn [name] has a history of prior GI bleed and on work was noted to also have 3 intramural Carolyn leiomyomas, the largest measuring 3.7 x 3.2 x 2.8c.  EMS was noted to be heterogenous and thickened suspicious for endometrial [name].  Carolyn [name]'s last menstrual period was 01/28/2021.  Carolyn [name] reports regular monthly menstrual cycles, not particularly heavy, and 7 days duration. Prior imaging CT abdomen and pelvis 06/29/2017 did not make note of any Carolyn [name], and the Carolyn [name] was unaware of a history of [name].  Carolyn [name] denies current vaginal bleeding.  Carolyn [name] also denies melena or hematochezia.  No abdominal pain other than some mild epigastric discomfort.    Carolyn [name] has one prior pregnancy with vaginal delivery.  Carolyn [name] is interested in pregnancy in the future.  Review of Systems:10 point review of systems negative unless noted in HPI  Past Medical History:  Carolyn [name] Active Problem List   Diagnosis Date Noted  . Acute blood loss [name] 02/12/2021  . History of gastric ulcer 02/12/2021  . History of GI bleed 02/12/2021  . Endometrial stripe increased 02/12/2021  . Vaginal bleeding 02/12/2021  . Menorrhagia 03/20/2018  . Generalized anxiety disorder 11/28/2016  . Major depressive disorder, single episode, moderate ([place]) 11/28/2016  . Iron deficiency [name], unspecified   . Gastric ulcer without hemorrhage or perforation but with obstruction   . Duodenal ulceration   . GI bleed due to NSAIDs   . Symptomatic [name] 02/24/2016  . 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 Overweight 07/14/2015    Past Surgical History:  Past Surgical History:  Procedure Laterality Date  . ESOPHAGOGASTRODUODENOSCOPY (EGD) WITH PROPOFOL N/A 02/26/2016    Procedure: ESOPHAGOGASTRODUODENOSCOPY (EGD) WITH PROPOFOL;  Surgeon: Lucilla Lame, MD;  Location: [facility] ENDOSCOPY;  Service: Endoscopy;  Laterality: N/A;    Carolyn History:   Obstetric History: G1P1001  Family History:  Family History  Problem Relation Age of Onset  . Diabetes Mother   . Hypertension Mother   . Lung cancer Father   . Lung cancer Maternal Grandmother   . Colon cancer Maternal Grandfather     Social History:  Social History   Socioeconomic History  . Marital status: Married    Spouse name: Not on file  . Number of children: Not on file  . Years of education: Not on file  . Highest education level: Not on file  Occupational History  . Not on file  Tobacco Use  . Smoking status: Current Every Day Smoker    Packs/day: 0.25    Types: Cigarettes  . Smokeless tobacco: Never Used  Vaping Use  . Vaping Use: Never used  Substance and Sexual Activity  . Alcohol use: Not Currently  . Drug use: Yes    Types: Marijuana  . Sexual activity: Yes    Partners: Male    Comment: no partner currently  Other Topics Concern  . Not on file  Social History Narrative  . Not on file   Social Determinants of Health   Financial Resource Strain: Not on file  Food Insecurity: Not on file  Transportation Needs: Not on file  Physical Activity: Not on file  Stress: Not on file  Social Connections: Not on file  Intimate Partner Violence: Not At Risk  . Fear of Current  or Ex-Partner: No  . Emotionally Abused: No  . Physically Abused: No  . Sexually Abused: No    Allergies:  No Known Allergies  Medications: Prior to Admission medications   Medication Sig Start Date End Date Taking? Authorizing Provider  norethindrone (MICRONOR) 0.35 MG tablet Take 1 tablet (0.35 mg total) by mouth daily. 11/07/20  Yes Jerene Dilling, PA  docusate sodium (COLACE) 100 MG capsule Take 1 capsule (100 mg total) by mouth 2 (two) times daily as needed for mild constipation. Carolyn [name] not  taking: No sig reported 06/26/20 06/26/21  Duffy Bruce, MD  ferrous sulfate 325 (65 FE) MG EC tablet Take 1 tablet (325 mg total) by mouth 2 (two) times daily. 06/26/20 09/24/20  Duffy Bruce, MD    Physical Exam Vitals: Blood pressure (!) 142/78, pulse 80, temperature 98.3 F (36.8 C), temperature source Oral, resp. rate 16, height 5\' 5"  (1.651 m), weight 79.4 kg, l Last menstrual period 01/28/2021, SpO2 100 %.  General: NAD HEENT: normocephalic, anicteric Pulmonary: No increased work of breathing Extremities: no edema, erythema, or tenderness Neurologic: Grossly intact Psychiatric: mood appropriate, affect full  Labs: Results for orders placed or performed during the hospital encounter of 02/11/21 (from the past 72 hour(s))  Urinalysis, Complete w Microscopic     Status: Abnormal   Collection Time: 02/11/21 10:54 PM  Result Value Ref Range   Color, Urine YELLOW (A) YELLOW   APPearance HAZY (A) CLEAR   Specific Gravity, Urine 1.021 1.005 - 1.030   pH 7.0 5.0 - 8.0   Glucose, UA NEGATIVE NEGATIVE mg/dL   Hgb urine dipstick MODERATE (A) NEGATIVE   Bilirubin Urine NEGATIVE NEGATIVE   Ketones, ur NEGATIVE NEGATIVE mg/dL   Protein, ur NEGATIVE NEGATIVE mg/dL   Nitrite NEGATIVE NEGATIVE   Leukocytes,Ua SMALL (A) NEGATIVE   RBC / HPF 0-5 0 - 5 RBC/hpf   WBC, UA 0-5 0 - 5 WBC/hpf   Bacteria, UA NONE SEEN NONE SEEN   Squamous Epithelial / LPF 6-10 0 - 5    Comment: Performed at [facility], [address] [place], [place] [address]  Basic metabolic panel     Status: Abnormal   Collection Time: 02/11/21 10:54 PM  Result Value Ref Range   Sodium 139 135 - 145 mmol/L   Potassium 3.5 3.5 - 5.1 mmol/L   Chloride 106 98 - 111 mmol/L   CO2 27 22 - 32 mmol/L   Glucose, Bld 108 (H) 70 - 99 mg/dL    Comment: Glucose reference range applies only to samples taken after fasting for at least 8 hours.   BUN 14 6 - 20 mg/dL   Creatinine, Ser 0.73 0.44 - 1.00 mg/dL   Calcium  9.0 8.9 - 10.3 mg/dL   GFR, Estimated >60 >60 mL/min    Comment: (NOTE) Calculated using the CKD-EPI Creatinine Equation (2021)    Anion gap 6 5 - 15    Comment: Performed at [facility], [address] [place], [place] [address]  CBC     Status: Abnormal   Collection Time: 02/11/21 10:54 PM  Result Value Ref Range   WBC 10.2 4.0 - 10.5 K/uL   RBC 3.90 3.87 - 5.11 MIL/uL   Hemoglobin 6.4 (L) 12.0 - 15.0 g/dL    Comment: Reticulocyte Hemoglobin testing may be clinically indicated, consider ordering this additional test [id number]    HCT 24.3 (L) 36.0 - 46.0 %   MCV 62.3 (L) 80.0 - 100.0 fL  MCH 16.4 (L) 26.0 - 34.0 pg   MCHC 26.3 (L) 30.0 - 36.0 g/dL   RDW 22.6 (H) 11.5 - 15.5 %   Platelets 340 150 - 400 K/uL   nRBC 0.0 0.0 - 0.2 %    Comment: Performed at [facility], [address] [place], [place] [address]  Hepatic function panel     Status: None   Collection Time: 02/11/21 10:54 PM  Result Value Ref Range   Total Protein 7.1 6.5 - 8.1 g/dL   Albumin 3.8 3.5 - 5.0 g/dL   AST 16 15 - 41 U/L   ALT 10 0 - 44 U/L   Alkaline Phosphatase 54 38 - 126 U/L   Total Bilirubin 0.5 0.3 - 1.2 mg/dL   Bilirubin, Direct <0.1 0.0 - 0.2 mg/dL   Indirect Bilirubin NOT CALCULATED 0.3 - 0.9 mg/dL    Comment: Performed at [facility], [address] [place], [place] [address]  Lipase, blood     Status: Abnormal   Collection Time: 02/11/21 10:54 PM  Result Value Ref Range   Lipase 63 (H) 11 - 51 U/L    Comment: Performed at [facility], [address] [place], [place] [address]  POC urine preg, ED     Status: None   Collection Time: 02/11/21 10:57 PM  Result Value Ref Range   Preg Test, Ur Negative Negative  Wet prep, genital     Status: Abnormal   Collection Time: 02/11/21 11:54 PM   Specimen: Genital  Result Value Ref Range   Yeast Wet Prep HPF POC NONE SEEN NONE SEEN   Trich, Wet Prep NONE SEEN NONE SEEN   Clue Cells Wet Prep HPF POC  PRESENT (A) NONE SEEN   WBC, Wet Prep HPF POC RARE (A) NONE SEEN   Sperm NONE SEEN     Comment: Performed at [facility], [address] [place], [place] [address]  [place] rt PCR [facility] only)     Status: None   Collection Time: 02/11/21 11:54 PM  Result Value Ref Range   Specimen source GC/Chlam ENDOCERVICAL    Chlamydia Tr NOT DETECTED NOT DETECTED   N gonorrhoeae NOT DETECTED NOT DETECTED    Comment: (NOTE) This CT/NG assay has not been evaluated in patients with a history of  hysterectomy. Performed at [facility], [address] [place], [place] [address]   Type and screen [place]     Status: None (Preliminary result)   Collection Time: 02/11/21 11:54 PM  Result Value Ref Range   ABO/RH(D) A POS    Antibody Screen NEG    Sample Expiration      02/14/2021,2359 Performed at [facility], [address], [place], [place] [address]    Unit Number [id number]    Blood Component Type RED CELLS,LR    Unit division 00    Status of Unit ISSUED    Transfusion Status OK TO TRANSFUSE    Crossmatch Result Compatible    Unit Number [id number]    Blood Component Type RED CELLS,LR    Unit division 00    Status of Unit ISSUED    Transfusion Status OK TO TRANSFUSE    Crossmatch Result Compatible   Protime-INR     Status: None   Collection Time: 02/11/21 11:56 PM  Result Value Ref Range   Prothrombin Time 12.9 11.4 - 15.2 seconds   INR 1.0 0.8 - 1.2    Comment: (NOTE) INR  goal varies based on device and disease states. Performed at [facility], [address] [place], [place] [address]   Prepare RBC (crossmatch)     Status: None   Collection Time: 02/12/21 12:01 AM  Result Value Ref Range   Order Confirmation      ORDER PROCESSED BY BLOOD BANK Performed at [facility], [address] [place], [place] [address]   Resp Panel by RT-PCR (Flu A&B, Covid) Nasopharyngeal Swab     Status: None    Collection Time: 02/12/21 12:01 AM   Specimen: Nasopharyngeal Swab; Nasopharyngeal(NP) swabs in vial transport medium  Result Value Ref Range   SARS Coronavirus 2 by RT PCR NEGATIVE NEGATIVE    Comment: (NOTE) SARS-CoV-2 target nucleic acids are NOT DETECTED.  The SARS-CoV-2 RNA is generally detectable in upper respiratory specimens during the acute phase of infection. The lowest concentration of SARS-CoV-2 viral copies this assay can detect is 138 copies/mL. A negative result does not preclude SARS-Cov-2 infection and should not be used as the sole basis for treatment or other Carolyn [name] management decisions. A negative result may occur with  improper specimen collection/handling, submission of specimen other than nasopharyngeal swab, presence of viral mutation(s) within the areas targeted by this assay, and inadequate number of viral copies(<138 copies/mL). A negative result must be combined with clinical observations, Carolyn [name] history, and epidemiological information. The expected result is Negative.  Fact Sheet for Patients:  EntrepreneurPulse.com.au  Fact Sheet for Healthcare Providers:  IncredibleEmployment.be  This test is no t yet approved or cleared by the [place] FDA and  has been authorized for detection and/or diagnosis of SARS-CoV-2 by FDA under an Emergency Use Authorization (EUA). This EUA will remain  in effect (meaning this test can be used) for the duration of the COVID-19 declaration under Section 564(b)(1) of the Act, 21 U.S.C.section 360bbb-3(b)(1), unless the authorization is terminated  or revoked sooner.       Influenza A by PCR NEGATIVE NEGATIVE   Influenza B by PCR NEGATIVE NEGATIVE    Comment: (NOTE) The Xpert Xpress SARS-CoV-2/FLU/RSV plus assay is intended as an aid in the diagnosis of influenza from Nasopharyngeal swab specimens and should not be used as a sole basis for treatment. Nasal washings  and aspirates are unacceptable for Xpert Xpress SARS-CoV-2/FLU/RSV testing.  Fact Sheet for Patients: EntrepreneurPulse.com.au  Fact Sheet for Healthcare Providers: IncredibleEmployment.be  This test is not yet approved or cleared by the [place] FDA and has been authorized for detection and/or diagnosis of SARS-CoV-2 by FDA under an Emergency Use Authorization (EUA). This EUA will remain in effect (meaning this test can be used) for the duration of the COVID-19 declaration under Section 564(b)(1) of the Act, 21 U.S.C. section 360bbb-3(b)(1), unless the authorization is terminated or revoked.  Performed at [facility], [address] [place], [place] [address]   CBC     Status: Abnormal   Collection Time: 02/12/21  5:54 AM  Result Value Ref Range   WBC 8.4 4.0 - 10.5 K/uL   RBC 4.01 3.87 - 5.11 MIL/uL   Hemoglobin 7.9 (L) 12.0 - 15.0 g/dL    Comment: Reticulocyte Hemoglobin testing may be clinically indicated, consider ordering this additional test [id number]    HCT 27.0 (L) 36.0 - 46.0 %   MCV 67.3 (L) 80.0 - 100.0 fL    Comment: REPEATED TO VERIFY   MCH 19.7 (L) 26.0 - 34.0 pg   MCHC 29.3 (L) 30.0 - 36.0 g/dL  RDW 26.5 (H) 11.5 - 15.5 %   Platelets 265 150 - 400 K/uL   nRBC 0.0 0.0 - 0.2 %    Comment: Performed at [facility], [place], [place] [address]  Iron and TIBC     Status: Abnormal   Collection Time: 02/12/21 10:27 AM  Result Value Ref Range   Iron 54 28 - 170 ug/dL   TIBC 489 (H) 250 - 450 ug/dL   Saturation Ratios 11 10.4 - 31.8 %   UIBC 435 ug/dL    Comment: Performed at [facility], [address] [place], [place] [address]    Imaging CT Renal Stone Study  Result Date: 02/11/2021 CLINICAL DATA:  Left-sided flank pain EXAM: CT ABDOMEN AND PELVIS WITHOUT CONTRAST TECHNIQUE: Multidetector CT imaging of the abdomen and pelvis was performed following the standard  protocol without IV contrast. COMPARISON:  CT 06/29/2017 FINDINGS: Lower chest: Lung bases demonstrate no acute consolidation or effusion. Normal cardiac size Hepatobiliary: Calcified gallstones. No focal hepatic abnormality. No biliary dilatation. Pancreas: Unremarkable. No pancreatic ductal dilatation or surrounding inflammatory changes. Spleen: Normal in size without focal abnormality. Adrenals/Urinary Tract: Adrenal glands are unremarkable. Kidneys are normal, without renal calculi, focal lesion, or hydronephrosis. Bladder is unremarkable. Stomach/Bowel: Stomach is within normal limits. Appendix appears normal. No evidence of bowel wall thickening, distention, or inflammatory changes. Vascular/Lymphatic: No significant vascular findings are present. No enlarged abdominal or pelvic lymph nodes. Reproductive: Uterus unremarkable.  4.2 cm right adnexal cyst. Other: Negative for free air or free fluid Musculoskeletal: No acute or significant osseous findings. IMPRESSION: 1. No CT evidence for acute intra-abdominal or pelvic abnormality. 2. Gallstones 3. 4.2 cm right adnexal cyst. No follow-up imaging recommended. Note: This recommendation does not apply to premenarchal patients and to those with increased risk (genetic, family history, elevated tumor markers or other high-risk factors) of ovarian cancer. Reference: JACR 2020 Feb; 17(2):248-254 Electronically Signed   By: Donavan Foil M.D.   On: 02/11/2021 23:44   US PELVIC COMPLETE W TRANSVAGINAL AND TORSION R/O  Result Date: 02/12/2021 CLINICAL DATA:  Initial evaluation for acute pelvic pain, vaginal bleeding. EXAM: TRANSABDOMINAL AND TRANSVAGINAL ULTRASOUND OF PELVIS DOPPLER ULTRASOUND OF OVARIES TECHNIQUE: Both transabdominal and transvaginal ultrasound examinations of the pelvis were performed. Transabdominal technique was performed for global imaging of the pelvis including uterus, ovaries, adnexal regions, and pelvic cul-de-sac. It was necessary to  proceed with endovaginal exam following the transabdominal exam to visualize the uterus, endometrium, and ovaries. Color and duplex Doppler ultrasound was utilized to evaluate blood flow to the ovaries. COMPARISON:  Prior CT from 02/11/2021 FINDINGS: Uterus Measurements: 9.3 x 6.2 x 7.0 cm = volume: 212 mL. Uterus is retroflexed. Multiple Carolyn [name] are seen, largest of which are measured: 1. 2.0 x 2.3 x 1.9 cm intramural fibroid present at the right posterior Carolyn body. 2. 2.9 x 2.9 x 2.0 cm intramural fibroid at the adjacent right posterior Carolyn body. 3. 3.7 x 3.2 x 2.8 cm intramural fibroid at the central Carolyn fundus. Endometrium Thickness: 16 mm. Heterogeneous echotexture without definite focal lesion. Associated vascularity seen with color Doppler imaging. Right ovary Measurements: 4.6 x 3.4 x 3.4 cm = volume: 28.0 mL. 3.1 x 3.0 x 2.4 cm simple cyst. No internal complexity, vascularity, or solid nodularity. No other adnexal mass. Left ovary Measurements: 3.2 x 2.3 x 1.9 cm = volume: 7.0 mL. Normal appearance/no adnexal mass. Pulsed Doppler evaluation of both ovaries demonstrates normal low-resistance arterial and venous waveforms.  Other findings No abnormal free fluid. IMPRESSION: 1. Enlarged fibroid uterus as detailed above. 2. Endometrial stripe thickened up to 16 mm with heterogeneous echotexture and associated vascularity. If bleeding remains unresponsive to hormonal or medical therapy, focal lesion work-up with sonohysterogram should be considered. Endometrial biopsy should also be considered in pre-menopausal patients at high risk for endometrial carcinoma. (Ref: Radiological Reasoning: Algorithmic Workup of Abnormal Vaginal Bleeding with Endovaginal Sonography and Sonohysterography. AJR [id number]). 3. 3.1 cm simple right ovarian cyst, likely benign. No follow up imaging recommended. Note: This recommendation does not apply to premenarchal patients or to those with increased risk  (genetic, family history, elevated tumor markers or other high-risk factors) of ovarian cancer. Reference: Radiology 2019 Nov; 293(2):359-371. 4. No evidence for ovarian torsion or other acute abnormality. Electronically Signed   By: Jeannine Boga M.D.   On: 02/12/2021 01:21     Assessment: 40 y.o. G1P1001 with [name], Carolyn [name], Carolyn [name]  Plan:  1) Carolyn [name] - management options discussed.  Given her desire for future fertility and the size of the [name] expectant management is reasonable.  However thickened endometrium with concern for endometrial [name] warrants further work up.  Discussed obtaining pap smear and endometrial biopsy vs posting for hysteroscopy D&C outpatient.  Until work up completed will have Carolyn [name] on provera 20mg  to suppress menstruation.   I. Hormonal    A) birth control pills    B) IUD    C) Oriahnn or Myfembree (fibroid specific but insurance coverage varies)    D) Depo Provera contraceptive    C) Depo Lupron (6 month course shrink [name] during that time)   II. Minimally invasive     A) Carolyn artery embolization - small catheter through groin to decrease blood flow to [name] causing them to shrink.  Local anesthesia outpatient   III.  Surgical    A) Myomectomy - remove individual [name]  6 week recovery and general anesthesia    B) Hysterectomy - remove uterus and [name] also prevents fibroid recurrence 6 week recovery and general anesthesia     Malachy Mood, MD, [place], [facility] 02/12/2021, 11:59 AM

## 2021-02-12 NOTE — H&P (Signed)
History and Physical    Carolyn [name] KVQ:[id number] DOB: Jun 06, 1981 DOA: 02/11/2021  PCP: Patient, No Pcp Per (Inactive)   Patient coming from: Home  I have personally briefly reviewed patient's old medical records in [place]  Chief Complaint: Left flank pain  HPI: Carolyn [name] is a 40 y.o. female with medical history significant for  GI bleed in 2017 with EGD that showed non-bleeding duodenal ulcers, attributed to NSAID use, as well as chronic iron deficiency anemia, who was in her usual state of health until 3 days ago when she started feeling fatigued, lightheaded and was sleeping a lot.  Yesterday she noted blood on the toilet tissue when wiping after urinating.  She has not had a bowel movement since the onset of the symptoms and states that she had a normal menstrual period that ended a week ago and lasted its usual 7 days.  On the day of arrival she started having left lower quadrant pain and left flank pain.  She denies nausea or vomiting and has had no fever or chills.  States that the abdominal pain feels similar to when she had her bleeding back in 2017. ED course: On arrival hypotensive at 83/66 with pulse of 96 and otherwise normal vitals.  Blood work significant for hemoglobin of 6.4, down from 7.9 several months prior, lipase 63, otherwise unremarkable urinalysis with moderate hemoglobin.  Hemoccult negative.  COVID and flu tests negative.  Clue cells on wet prep. Imaging: CT abdomen and pelvis without acute intra-abdominal or pelvic abnormality.  Gallstones, right adnexal cyst with no follow-up imaging recommended Pelvic ultrasound: Fibroid uterus thickened endometrial stripe of 16 mm and simple right ovarian cyst likely benign  Patient was bolused in the emergency room with improvement in blood pressure from 83/66 on arrival to 121/78 by admission.  Patient started on transfusion of 1 unit PRBC.  Hospitalist consulted for admission.  Review of Systems: As per  HPI otherwise all other systems on review of systems negative.    Past Medical History:  Diagnosis Date  . Anemia   . GERD (gastroesophageal reflux disease)   . Sickle cell trait [facility])     Past Surgical History:  Procedure Laterality Date  . ESOPHAGOGASTRODUODENOSCOPY (EGD) WITH PROPOFOL N/A 02/26/2016   Procedure: ESOPHAGOGASTRODUODENOSCOPY (EGD) WITH PROPOFOL;  Surgeon: Lucilla Lame, MD;  Location: [facility] ENDOSCOPY;  Service: Endoscopy;  Laterality: N/A;     reports that she has been smoking cigarettes. She has been smoking about 0.25 packs per day. She has never used smokeless tobacco. She reports previous alcohol use. She reports current drug use. Drug: Marijuana.  No Known Allergies  Family History  Problem Relation Age of Onset  . Diabetes Mother   . Hypertension Mother   . Lung cancer Father   . Lung cancer Maternal Grandmother   . Colon cancer Maternal Grandfather       Prior to Admission medications   Medication Sig Start Date End Date Taking? Authorizing Provider  norethindrone (MICRONOR) 0.35 MG tablet Take 1 tablet (0.35 mg total) by mouth daily. 11/07/20  Yes Jerene Dilling, PA  docusate sodium (COLACE) 100 MG capsule Take 1 capsule (100 mg total) by mouth 2 (two) times daily as needed for mild constipation. Patient not taking: No sig reported 06/26/20 06/26/21  Duffy Bruce, MD  ferrous sulfate 325 (65 FE) MG EC tablet Take 1 tablet (325 mg total) by mouth 2 (two) times daily. 06/26/20 09/24/20  Duffy Bruce, MD  Physical Exam: Vitals:   02/12/21 0104 02/12/21 0134 02/12/21 0149 02/12/21 0200  BP: 132/80 115/81 129/86 121/78  Pulse: 80 87 88 85  Resp:  18 16 18   Temp:  98.6 F (37 C) 98.7 F (37.1 C)   TempSrc:  Oral Oral   SpO2: 100% 99% 98% 100%  Weight:      Height:         Vitals:   02/12/21 0104 02/12/21 0134 02/12/21 0149 02/12/21 0200  BP: 132/80 115/81 129/86 121/78  Pulse: 80 87 88 85  Resp:  18 16 18   Temp:  98.6 F (37 C) 98.7 F  (37.1 C)   TempSrc:  Oral Oral   SpO2: 100% 99% 98% 100%  Weight:      Height:          Constitutional: Alert and oriented x 3 . Not in any apparent distress HEENT:      Head: Normocephalic and atraumatic.         Eyes: PERLA, EOMI, Conjunctivae are normal. Sclera is non-icteric.       Mouth/Throat: Mucous membranes are moist.       Neck: Supple with no signs of meningismus. Cardiovascular: Regular rate and rhythm. No murmurs, gallops, or rubs. 2+ symmetrical distal pulses are present . No JVD. No LE edema Respiratory: Respiratory effort normal .Lungs sounds clear bilaterally. No wheezes, crackles, or rhonchi.  Gastrointestinal: Soft, tender RLQ, non distended with positive bowel sounds.  Genitourinary: No CVA tenderness. Musculoskeletal: Nontender with normal range of motion in all extremities. No cyanosis, or erythema of extremities. Neurologic:  Face is symmetric. Moving all extremities. No gross focal neurologic deficits . Skin: Skin is warm, dry.  No rash or ulcers Psychiatric: Mood and affect are normal    Labs on Admission: I have personally reviewed following labs and imaging studies  CBC: Recent Labs  Lab 02/11/21 2254  WBC 10.2  HGB 6.4*  HCT 24.3*  MCV 62.3*  PLT 433   Basic Metabolic Panel: Recent Labs  Lab 02/11/21 2254  NA 139  K 3.5  CL 106  CO2 27  GLUCOSE 108*  BUN 14  CREATININE 0.73  CALCIUM 9.0   GFR: Estimated Creatinine Clearance: 100.3 mL/min (by C-G formula based on SCr of 0.73 mg/dL). Liver Function Tests: Recent Labs  Lab 02/11/21 2254  AST 16  ALT 10  ALKPHOS 54  BILITOT 0.5  PROT 7.1  ALBUMIN 3.8   Recent Labs  Lab 02/11/21 2254  LIPASE 63*   No results for input(s): AMMONIA in the last 168 hours. Coagulation Profile: Recent Labs  Lab 02/11/21 2356  INR 1.0   Cardiac Enzymes: No results for input(s): CKTOTAL, CKMB, CKMBINDEX, TROPONINI in the last 168 hours. BNP (last 3 results) No results for input(s): PROBNP  in the last 8760 hours. HbA1C: No results for input(s): HGBA1C in the last 72 hours. CBG: No results for input(s): GLUCAP in the last 168 hours. Lipid Profile: No results for input(s): CHOL, HDL, LDLCALC, TRIG, CHOLHDL, LDLDIRECT in the last 72 hours. Thyroid Function Tests: No results for input(s): TSH, T4TOTAL, FREET4, T3FREE, THYROIDAB in the last 72 hours. Anemia Panel: No results for input(s): VITAMINB12, FOLATE, FERRITIN, TIBC, IRON, RETICCTPCT in the last 72 hours. Urine analysis:    Component Value Date/Time   COLORURINE YELLOW (A) 02/11/2021 2254   APPEARANCEUR HAZY (A) 02/11/2021 2254   LABSPEC 1.021 02/11/2021 2254   PHURINE 7.0 02/11/2021 [place] 02/11/2021 2254  HGBUR MODERATE (A) 02/11/2021 2254   BILIRUBINUR NEGATIVE 02/11/2021 2254   KETONESUR NEGATIVE 02/11/2021 2254   PROTEINUR NEGATIVE 02/11/2021 2254   NITRITE NEGATIVE 02/11/2021 2254   LEUKOCYTESUR SMALL (A) 02/11/2021 2254    Radiological Exams on Admission: CT Renal Stone Study  Result Date: 02/11/2021 CLINICAL DATA:  Left-sided flank pain EXAM: CT ABDOMEN AND PELVIS WITHOUT CONTRAST TECHNIQUE: Multidetector CT imaging of the abdomen and pelvis was performed following the standard protocol without IV contrast. COMPARISON:  CT 06/29/2017 FINDINGS: Lower chest: Lung bases demonstrate no acute consolidation or effusion. Normal cardiac size Hepatobiliary: Calcified gallstones. No focal hepatic abnormality. No biliary dilatation. Pancreas: Unremarkable. No pancreatic ductal dilatation or surrounding inflammatory changes. Spleen: Normal in size without focal abnormality. Adrenals/Urinary Tract: Adrenal glands are unremarkable. Kidneys are normal, without renal calculi, focal lesion, or hydronephrosis. Bladder is unremarkable. Stomach/Bowel: Stomach is within normal limits. Appendix appears normal. No evidence of bowel wall thickening, distention, or inflammatory changes. Vascular/Lymphatic: No  significant vascular findings are present. No enlarged abdominal or pelvic lymph nodes. Reproductive: Uterus unremarkable.  4.2 cm right adnexal cyst. Other: Negative for free air or free fluid Musculoskeletal: No acute or significant osseous findings. IMPRESSION: 1. No CT evidence for acute intra-abdominal or pelvic abnormality. 2. Gallstones 3. 4.2 cm right adnexal cyst. No follow-up imaging recommended. Note: This recommendation does not apply to premenarchal patients and to those with increased risk (genetic, family history, elevated tumor markers or other high-risk factors) of ovarian cancer. Reference: JACR 2020 Feb; 17(2):248-254 Electronically Signed   By: Donavan Foil M.D.   On: 02/11/2021 23:44   US PELVIC COMPLETE W TRANSVAGINAL AND TORSION R/O  Result Date: 02/12/2021 CLINICAL DATA:  Initial evaluation for acute pelvic pain, vaginal bleeding. EXAM: TRANSABDOMINAL AND TRANSVAGINAL ULTRASOUND OF PELVIS DOPPLER ULTRASOUND OF OVARIES TECHNIQUE: Both transabdominal and transvaginal ultrasound examinations of the pelvis were performed. Transabdominal technique was performed for global imaging of the pelvis including uterus, ovaries, adnexal regions, and pelvic cul-de-sac. It was necessary to proceed with endovaginal exam following the transabdominal exam to visualize the uterus, endometrium, and ovaries. Color and duplex Doppler ultrasound was utilized to evaluate blood flow to the ovaries. COMPARISON:  Prior CT from 02/11/2021 FINDINGS: Uterus Measurements: 9.3 x 6.2 x 7.0 cm = volume: 212 mL. Uterus is retroflexed. Multiple uterine fibroids are seen, largest of which are measured: 1. 2.0 x 2.3 x 1.9 cm intramural fibroid present at the right posterior uterine body. 2. 2.9 x 2.9 x 2.0 cm intramural fibroid at the adjacent right posterior uterine body. 3. 3.7 x 3.2 x 2.8 cm intramural fibroid at the central uterine fundus. Endometrium Thickness: 16 mm. Heterogeneous echotexture without definite focal  lesion. Associated vascularity seen with color Doppler imaging. Right ovary Measurements: 4.6 x 3.4 x 3.4 cm = volume: 28.0 mL. 3.1 x 3.0 x 2.4 cm simple cyst. No internal complexity, vascularity, or solid nodularity. No other adnexal mass. Left ovary Measurements: 3.2 x 2.3 x 1.9 cm = volume: 7.0 mL. Normal appearance/no adnexal mass. Pulsed Doppler evaluation of both ovaries demonstrates normal low-resistance arterial and venous waveforms. Other findings No abnormal free fluid. IMPRESSION: 1. Enlarged fibroid uterus as detailed above. 2. Endometrial stripe thickened up to 16 mm with heterogeneous echotexture and associated vascularity. If bleeding remains unresponsive to hormonal or medical therapy, focal lesion work-up with sonohysterogram should be considered. Endometrial biopsy should also be considered in pre-menopausal patients at high risk for endometrial carcinoma. (Ref: Radiological Reasoning: Algorithmic Workup of Abnormal Vaginal Bleeding  with Endovaginal Sonography and Sonohysterography. AJR [id number]). 3. 3.1 cm simple right ovarian cyst, likely benign. No follow up imaging recommended. Note: This recommendation does not apply to premenarchal patients or to those with increased risk (genetic, family history, elevated tumor markers or other high-risk factors) of ovarian cancer. Reference: Radiology 2019 Nov; 293(2):359-371. 4. No evidence for ovarian torsion or other acute abnormality. Electronically Signed   By: Jeannine Boga M.D.   On: 02/12/2021 01:21     Assessment/Plan 40 year old female with history of GI bleed in 2017 with EGD that showed non-bleeding duodenal ulcers, chronic iron deficiency anemia, presenting with fatigued, lightheadedness, and left flank pain as well as vaginal spotting.     Symptomatic anemia   Chronic iron deficiency anemia, unspecified   Acute blood loss anemia - Hemoglobin 6.4, down from 7.9 six months prior, symptomatic for fatigue and  lightheadedness, hypotensive to 83/66 on arrival - Source uncertain, given that patient reports seeing blood on the tissue when wiping after passing urine.  Had no BM since onset so no reports of melena or hematochezia and stool for occult blood was negative - Urinalysis with moderate hemoglobin - CT renal stone study with no acute finding.  Gallstones seen - Pelvic ultrasound showing thickened endometrial stripe of 16 mm.  And patient reports no obvious vaginal bleeding having finished her normal period a week prior - We will presume GI etiology in view of past history - Continue transfusion of 1 unit PRBC - IV hydration  Possible GI bleed History of gastric ulcer   History of GI bleed - Patient states abdominal pain feels similar to pain felt when she had her GI bleed in 2017 - Keep n.p.o. - IV Protonix    Endometrial stripe increased on pelvic ultrasound - Consider GYN consult inpatient versus outpatient.  Clue cells on wet prep - Flagyl when oral intake resumes    DVT prophylaxis: SCDs Code Status: full code  Family Communication:  none  Disposition Plan: Back to previous home environment Consults called: GI Status: Observation    Athena Masse MD [facility]     02/12/2021, 2:27 AM

## 2021-02-12 NOTE — Hospital Course (Signed)
Patient seen and examined  40 year old G1, P1-prior GI bleed with hiatal hernia on endoscopy 2017 secondary to NSAIDs Has had notable blood while wiping after passing urine [facility]es to the health department has regular physicals but has not had any specific women's follow-up Does not use contraceptives-has used Depo-Provera in the past Usual menstruation is every 28 days does not typically have breakthrough bleeding--finished her last menses about 02/05/2021  Developed over the past several days fatigue lethargy ED work-up revealed hemoglobin 6.4 down from 7 Also found to have clue cells on wet prep CT abdomen pelvis showed fibroid uterus with thickened endometrial stripe simple right ovarian cyst She was hypotensive and therefore transfused 1 unit PRBC  In discussion with her in more detail she tells me she has not had any specific dark or tarry stool at all over the past several days and in fact has been significantly constipated because she has been eating too much cheese  I have spoken with Dr. Reece Levy of GI and told her that I will start a regular diet a little later this morning   will discuss with OB/GYN later this morning for recommendations-if she stabilizes and has no further bleeding she may benefit from either Provera or mifepristone to prevent bleeding but we will keep her overnight to observe her to ensure that this does not occur during hospital stay  I will get iron studies

## 2021-02-13 LAB — TYPE AND SCREEN
ABO/RH(D): A POS
Antibody Screen: NEGATIVE
Unit division: 0
Unit division: 0

## 2021-02-13 LAB — CBC WITH DIFFERENTIAL/PLATELET
Abs Immature Granulocytes: 0.02 10*3/uL (ref 0.00–0.07)
Basophils Absolute: 0 10*3/uL (ref 0.0–0.1)
Basophils Relative: 1 %
Eosinophils Absolute: 0.2 10*3/uL (ref 0.0–0.5)
Eosinophils Relative: 2 %
HCT: 30.2 % — ABNORMAL LOW (ref 36.0–46.0)
Hemoglobin: 8.7 g/dL — ABNORMAL LOW (ref 12.0–15.0)
Immature Granulocytes: 0 %
Lymphocytes Relative: 36 %
Lymphs Abs: 2.8 10*3/uL (ref 0.7–4.0)
MCH: 19.2 pg — ABNORMAL LOW (ref 26.0–34.0)
MCHC: 28.8 g/dL — ABNORMAL LOW (ref 30.0–36.0)
MCV: 66.7 fL — ABNORMAL LOW (ref 80.0–100.0)
Monocytes Absolute: 0.5 10*3/uL (ref 0.1–1.0)
Monocytes Relative: 6 %
Neutro Abs: 4.3 10*3/uL (ref 1.7–7.7)
Neutrophils Relative %: 55 %
Platelets: 272 10*3/uL (ref 150–400)
RBC: 4.53 MIL/uL (ref 3.87–5.11)
RDW: 26.4 % — ABNORMAL HIGH (ref 11.5–15.5)
Smear Review: NORMAL
WBC: 7.7 10*3/uL (ref 4.0–10.5)
nRBC: 0 % (ref 0.0–0.2)

## 2021-02-13 LAB — BPAM RBC
Blood Product Expiration Date: 202206052359
Blood Product Expiration Date: 202206052359
ISSUE DATE / TIME: 202205130125
ISSUE DATE / TIME: 202205130347
Unit Type and Rh: 6200
Unit Type and Rh: 6200

## 2021-02-13 LAB — URINE CULTURE: Culture: 10000 — AB

## 2021-02-13 LAB — PREPARE RBC (CROSSMATCH)

## 2021-02-13 MED ORDER — MEDROXYPROGESTERONE ACETATE 10 MG PO TABS
20.0000 mg | ORAL_TABLET | Freq: Every day | ORAL | 0 refills | Status: DC
  Filled 2021-02-13: qty 40, 20d supply, fill #0

## 2021-02-13 MED ORDER — ACETAMINOPHEN-CODEINE #3 300-30 MG PO TABS: 1.0000 | ORAL_TABLET | Freq: Four times a day (QID) | ORAL | 0 refills | Status: AC | PRN

## 2021-02-13 MED ORDER — FERROUS SULFATE 325 (65 FE) MG PO TBEC
325.0000 mg | DELAYED_RELEASE_TABLET | Freq: Two times a day (BID) | ORAL | 0 refills | Status: DC
Start: 2021-02-13 — End: 2021-02-15
  Filled 2021-02-13: qty 720, 360d supply, fill #0

## 2021-02-13 NOTE — Progress Notes (Addendum)
CSW spoke with patient to assess for needs due to patient being uninsured.  Patient says she does have Medicaid, CSW [employer], asked them to confirm coverage.  Patient says she is going to use [facility] for PCP services. Drives herself to appointments.  Patient is interested in using [facility] as she does not think her insurance covers medications. Patient is discharging today. CSW checked [facility] and patient feels she can afford copays for DC meds. Patient to follow up with [facility] for longer term medication assistance. CSW provided application and Good [facility] card at bedside.  No additional TOC needs identified.  Oleh Genin, [place]

## 2021-02-13 NOTE — Progress Notes (Signed)
Patient verbalized understanding of all discharge instructions including follow up appointments.

## 2021-02-13 NOTE — Discharge Summary (Signed)
Physician Discharge Summary  Carolyn [name] EYC:[id number] DOB: 1981/07/10 DOA: 02/11/2021  PCP: Patient, No Pcp Per (Inactive)  Admit date: 02/11/2021 Discharge date: 02/13/2021  Time spent: 27 minutes  Recommendations for Outpatient Follow-up:  1. Patient started on Provera this admission 2. Needs outpatient OB/GYN follow-up  Discharge Diagnoses:  MAIN problem for hospitalization   Symptomatic anemia from hemorrhoids  Please see below for itemized issues addressed in HOpsital- refer to other progress notes for clarity if needed  Discharge Condition: Improved  Diet recommendation: Regular  Filed Weights   02/11/21 2221  Weight: 79.4 kg    History of present illness:  40 year old G1, P1-prior GI bleed with hiatal hernia on endoscopy 2017 secondary to NSAIDs Has had notable blood while wiping after passing urine Goes to the health department has regular physicals but has not had any specific women's follow-up Does not use contraceptives-has used Depo-Provera in the past Usual menstruation is every 28 days does not typically have breakthrough bleeding--finished her last menses about 02/05/2021  Developed over the past several days fatigue lethargy ED work-up revealed hemoglobin 6.4 down from 7 Also found to have clue cells on wet prep CT abdomen pelvis showed fibroid uterus with thickened endometrial stripe simple right ovarian cyst She was hypotensive and therefore transfused 1 unit PRBC and her hypotension resolved during hospital stay  In discussion with her in more detail she tells me she has not had any specific dark or tarry stool at all over the past several days and in fact has been significantly constipated because she has been eating too much cheese   patient was discussed with OB/GYN who made recommendations for outpatient follow-up in the outpatient for uterus preserving therapy and recommended use of Provera in the interim We prescribed the Provera as well as the  iron on discharge in addition to Tylenol 3 for severe pain  She should follow-up with Dr. Leafy Ro  Consultations:  Dr. Georgianne Fick of OB=-GYN  Discharge Exam: Vitals:   02/13/21 0430 02/13/21 0739  BP: 128/85 130/88  Pulse: 68 69  Resp: 16 17  Temp: 98 F (36.7 C) 98.2 F (36.8 C)  SpO2: 100% 100%    Subj on day of d/c   Awake coherent no distress looks well Laughing joking with family  General Exam on discharge   Awake coherent no distress Chest clear no added sound no rales  Abdomen soft no rebound nontender S1-S2 no murmur no rub no gallop Neurologically intact moving all 4 limbs Multiple tattoos    Discharge Instructions   Discharge Instructions    Diet - low sodium heart healthy   Complete by: As directed    Discharge instructions   Complete by: As directed    You have a diagnosis of fibroid uterus which can cause bleeding discomfort and pain We will discharge you on Provera which is a hormone that suppresses the growth of this area of your uterus and as discussed with Dr. Leafy Ro you will need options for further management I will give you 3 to 4 days of Tylenol 3 on discharge for extremely severe pain to help with the pain You can can use plain Tylenol subsequently Please make sure you follow-up with the OB/GYN as indicated-have a good summer   Increase activity slowly   Complete by: As directed      Allergies as of 02/13/2021   No Known Allergies     Medication List    STOP taking these medications   norethindrone 0.35 MG  tablet Commonly known as: MICRONOR     TAKE these medications   acetaminophen-codeine 300-30 MG tablet Commonly known as: TYLENOL #3 Take 1-2 tablets by mouth every 6 (six) hours as needed for up to 5 days for severe pain.   docusate sodium 100 MG capsule Commonly known as: Colace Take 1 capsule (100 mg total) by mouth 2 (two) times daily as needed for mild constipation.   ferrous sulfate 325 (65 FE) MG EC tablet Take 1  tablet (325 mg total) by mouth 2 (two) times daily.   medroxyPROGESTERone 10 MG tablet Commonly known as: PROVERA Take 2 tablets (20 mg total) by mouth daily. Start taking on: Feb 14, 2021      No Known Allergies  Follow-up Information    [facility]. Schedule an appointment as soon as possible for a visit in 1 week(s).   Why: Hospital follow up fibroids Contact information: [address] [place] [address] [phone]                The results of significant diagnostics from this hospitalization (including imaging, microbiology, ancillary and laboratory) are listed below for reference.    Significant Diagnostic Studies: CT Renal Stone Study  Result Date: 02/11/2021 CLINICAL DATA:  Left-sided flank pain EXAM: CT ABDOMEN AND PELVIS WITHOUT CONTRAST TECHNIQUE: Multidetector CT imaging of the abdomen and pelvis was performed following the standard protocol without IV contrast. COMPARISON:  CT 06/29/2017 FINDINGS: Lower chest: Lung bases demonstrate no acute consolidation or effusion. Normal cardiac size Hepatobiliary: Calcified gallstones. No focal hepatic abnormality. No biliary dilatation. Pancreas: Unremarkable. No pancreatic ductal dilatation or surrounding inflammatory changes. Spleen: Normal in size without focal abnormality. Adrenals/Urinary Tract: Adrenal glands are unremarkable. Kidneys are normal, without renal calculi, focal lesion, or hydronephrosis. Bladder is unremarkable. Stomach/Bowel: Stomach is within normal limits. Appendix appears normal. No evidence of bowel wall thickening, distention, or inflammatory changes. Vascular/Lymphatic: No significant vascular findings are present. No enlarged abdominal or pelvic lymph nodes. Reproductive: Uterus unremarkable.  4.2 cm right adnexal cyst. Other: Negative for free air or free fluid Musculoskeletal: No acute or significant osseous findings. IMPRESSION: 1. No CT evidence for acute intra-abdominal or  pelvic abnormality. 2. Gallstones 3. 4.2 cm right adnexal cyst. No follow-up imaging recommended. Note: This recommendation does not apply to premenarchal patients and to those with increased risk (genetic, family history, elevated tumor markers or other high-risk factors) of ovarian cancer. Reference: JACR 2020 Feb; 17(2):248-254 Electronically Signed   By: Donavan Foil M.D.   On: 02/11/2021 23:44   US PELVIC COMPLETE W TRANSVAGINAL AND TORSION R/O  Result Date: 02/12/2021 CLINICAL DATA:  Initial evaluation for acute pelvic pain, vaginal bleeding. EXAM: TRANSABDOMINAL AND TRANSVAGINAL ULTRASOUND OF PELVIS DOPPLER ULTRASOUND OF OVARIES TECHNIQUE: Both transabdominal and transvaginal ultrasound examinations of the pelvis were performed. Transabdominal technique was performed for global imaging of the pelvis including uterus, ovaries, adnexal regions, and pelvic cul-de-sac. It was necessary to proceed with endovaginal exam following the transabdominal exam to visualize the uterus, endometrium, and ovaries. Color and duplex Doppler ultrasound was utilized to evaluate blood flow to the ovaries. COMPARISON:  Prior CT from 02/11/2021 FINDINGS: Uterus Measurements: 9.3 x 6.2 x 7.0 cm = volume: 212 mL. Uterus is retroflexed. Multiple uterine fibroids are seen, largest of which are measured: 1. 2.0 x 2.3 x 1.9 cm intramural fibroid present at the right posterior uterine body. 2. 2.9 x 2.9 x 2.0 cm intramural fibroid at the adjacent right posterior uterine body. 3.  3.7 x 3.2 x 2.8 cm intramural fibroid at the central uterine fundus. Endometrium Thickness: 16 mm. Heterogeneous echotexture without definite focal lesion. Associated vascularity seen with color Doppler imaging. Right ovary Measurements: 4.6 x 3.4 x 3.4 cm = volume: 28.0 mL. 3.1 x 3.0 x 2.4 cm simple cyst. No internal complexity, vascularity, or solid nodularity. No other adnexal mass. Left ovary Measurements: 3.2 x 2.3 x 1.9 cm = volume: 7.0 mL. Normal  appearance/no adnexal mass. Pulsed Doppler evaluation of both ovaries demonstrates normal low-resistance arterial and venous waveforms. Other findings No abnormal free fluid. IMPRESSION: 1. Enlarged fibroid uterus as detailed above. 2. Endometrial stripe thickened up to 16 mm with heterogeneous echotexture and associated vascularity. If bleeding remains unresponsive to hormonal or medical therapy, focal lesion work-up with sonohysterogram should be considered. Endometrial biopsy should also be considered in pre-menopausal patients at high risk for endometrial carcinoma. (Ref: Radiological Reasoning: Algorithmic Workup of Abnormal Vaginal Bleeding with Endovaginal Sonography and Sonohysterography. AJR 2008; 382:N05-39). 3. 3.1 cm simple right ovarian cyst, likely benign. No follow up imaging recommended. Note: This recommendation does not apply to premenarchal patients or to those with increased risk (genetic, family history, elevated tumor markers or other high-risk factors) of ovarian cancer. Reference: Radiology 2019 Nov; 293(2):359-371. 4. No evidence for ovarian torsion or other acute abnormality. Electronically Signed   By: Jeannine Boga M.D.   On: 02/12/2021 01:21    Microbiology: Recent Results (from the past 240 hour(s))  Wet prep, genital     Status: Abnormal   Collection Time: 02/11/21 11:54 PM   Specimen: Genital  Result Value Ref Range Status   Yeast Wet Prep HPF POC NONE SEEN NONE SEEN Final   Trich, Wet Prep NONE SEEN NONE SEEN Final   Clue Cells Wet Prep HPF POC PRESENT (A) NONE SEEN Final   WBC, Wet Prep HPF POC RARE (A) NONE SEEN Final   Sperm NONE SEEN  Final    Comment: Performed at [facility], [address] [place], [place] [address]  [place] rt PCR [facility] only)     Status: None   Collection Time: 02/11/21 11:54 PM  Result Value Ref Range Status   Specimen source GC/Chlam ENDOCERVICAL  Final   Chlamydia Tr NOT DETECTED NOT DETECTED Final   N  gonorrhoeae NOT DETECTED NOT DETECTED Final    Comment: (NOTE) This CT/NG assay has not been evaluated in patients with a history of  hysterectomy. Performed at [facility], [place], [place] [address]   Resp Panel by RT-PCR (Flu A&B, Covid) Nasopharyngeal Swab     Status: None   Collection Time: 02/12/21 12:01 AM   Specimen: Nasopharyngeal Swab; Nasopharyngeal(NP) swabs in vial transport medium  Result Value Ref Range Status   SARS Coronavirus 2 by RT PCR NEGATIVE NEGATIVE Final    Comment: (NOTE) SARS-CoV-2 target nucleic acids are NOT DETECTED.  The SARS-CoV-2 RNA is generally detectable in upper respiratory specimens during the acute phase of infection. The lowest concentration of SARS-CoV-2 viral copies this assay can detect is 138 copies/mL. A negative result does not preclude SARS-Cov-2 infection and should not be used as the sole basis for treatment or other patient management decisions. A negative result may occur with  improper specimen collection/handling, submission of specimen other than nasopharyngeal swab, presence of viral mutation(s) within the areas targeted by this assay, and inadequate number of viral copies(<138 copies/mL). A negative result must be combined with clinical observations, patient history, and epidemiological  information. The expected result is Negative.  Fact Sheet for Patients:  EntrepreneurPulse.com.au  Fact Sheet for Healthcare Providers:  IncredibleEmployment.be  This test is no t yet approved or cleared by the [place] FDA and  has been authorized for detection and/or diagnosis of SARS-CoV-2 by FDA under an Emergency Use Authorization (EUA). This EUA will remain  in effect (meaning this test can be used) for the duration of the COVID-19 declaration under Section 564(b)(1) of the Act, 21 U.S.C.section 360bbb-3(b)(1), unless the authorization is terminated  or revoked  sooner.       Influenza A by PCR NEGATIVE NEGATIVE Final   Influenza B by PCR NEGATIVE NEGATIVE Final    Comment: (NOTE) The Xpert Xpress SARS-CoV-2/FLU/RSV plus assay is intended as an aid in the diagnosis of influenza from Nasopharyngeal swab specimens and should not be used as a sole basis for treatment. Nasal washings and aspirates are unacceptable for Xpert Xpress SARS-CoV-2/FLU/RSV testing.  Fact Sheet for Patients: EntrepreneurPulse.com.au  Fact Sheet for Healthcare Providers: IncredibleEmployment.be  This test is not yet approved or cleared by the [place] FDA and has been authorized for detection and/or diagnosis of SARS-CoV-2 by FDA under an Emergency Use Authorization (EUA). This EUA will remain in effect (meaning this test can be used) for the duration of the COVID-19 declaration under Section 564(b)(1) of the Act, 21 U.S.C. section 360bbb-3(b)(1), unless the authorization is terminated or revoked.  Performed at [facility], [address] [place], [place] [address]      Labs: Basic Metabolic Panel: Recent Labs  Lab 02/11/21 2254  NA 139  K 3.5  CL 106  CO2 27  GLUCOSE 108*  BUN 14  CREATININE 0.73  CALCIUM 9.0   Liver Function Tests: Recent Labs  Lab 02/11/21 2254  AST 16  ALT 10  ALKPHOS 54  BILITOT 0.5  PROT 7.1  ALBUMIN 3.8   Recent Labs  Lab 02/11/21 2254  LIPASE 63*   No results for input(s): AMMONIA in the last 168 hours. CBC: Recent Labs  Lab 02/11/21 2254 02/12/21 0554 02/13/21 0806  WBC 10.2 8.4 7.7  NEUTROABS  --   --  4.3  HGB 6.4* 7.9* 8.7*  HCT 24.3* 27.0* 30.2*  MCV 62.3* 67.3* 66.7*  PLT 340 265 272   Cardiac Enzymes: No results for input(s): CKTOTAL, CKMB, CKMBINDEX, TROPONINI in the last 168 hours. BNP: BNP (last 3 results) No results for input(s): BNP in the last 8760 hours.  ProBNP (last 3 results) No results for input(s): PROBNP in the last 8760  hours.  CBG: No results for input(s): GLUCAP in the last 168 hours.     Signed:  Nita Sells MD   [facility] 02/13/2021, 10:32 AM

## 2021-02-13 NOTE — Progress Notes (Signed)
Patient self ambulated out of facility in stable condition.

## 2021-02-15 ENCOUNTER — Ambulatory Visit: Payer: Medicaid Other

## 2021-02-15 ENCOUNTER — Other Ambulatory Visit: Payer: Self-pay

## 2021-02-18 ENCOUNTER — Other Ambulatory Visit (HOSPITAL_COMMUNITY)
Admission: RE | Admit: 2021-02-18 | Discharge: 2021-02-18 | Disposition: A | Discharge status: Home / Self Care | Payer: Medicaid Other | Source: Ambulatory Visit | Attending: Obstetrics | Admitting: Obstetrics

## 2021-02-18 ENCOUNTER — Encounter: Payer: Self-pay | Admitting: Obstetrics & Gynecology

## 2021-02-18 ENCOUNTER — Ambulatory Visit (INDEPENDENT_AMBULATORY_CARE_PROVIDER_SITE_OTHER): Payer: Self-pay | Admitting: Obstetrics & Gynecology

## 2021-02-18 ENCOUNTER — Other Ambulatory Visit: Payer: Self-pay

## 2021-02-18 VITALS — BP 120/80 | Ht 68.0 in | Wt 173.0 lb

## 2021-02-18 DIAGNOSIS — D219 Benign neoplasm of connective and other soft tissue, unspecified: Secondary | ICD-10-CM | POA: Diagnosis not present

## 2021-02-18 DIAGNOSIS — Z124 Encounter for screening for malignant neoplasm of cervix: Secondary | ICD-10-CM | POA: Diagnosis not present

## 2021-02-18 DIAGNOSIS — D500 Iron deficiency anemia secondary to blood loss (chronic): Secondary | ICD-10-CM

## 2021-02-18 DIAGNOSIS — D261 Other benign neoplasm of corpus uteri: Secondary | ICD-10-CM | POA: Insufficient documentation

## 2021-02-18 DIAGNOSIS — N920 Excessive and frequent menstruation with regular cycle: Secondary | ICD-10-CM | POA: Insufficient documentation

## 2021-02-18 DIAGNOSIS — D259 Leiomyoma of uterus, unspecified: Secondary | ICD-10-CM | POA: Diagnosis present

## 2021-02-18 NOTE — Patient Instructions (Signed)
Thank you for choosing [facility]. As part of our ongoing efforts to improve patient experience, we would appreciate your feedback. Please fill out the short survey that you will receive by mail or MyChart. Your opinion is important to [place]! -Dr Kenton Kingfisher  Uterine Fibroids  Uterine fibroids, also called leiomyomas, are noncancerous (benign) tumors that can grow in the uterus. They can cause heavy menstrual bleeding and pain. Fibroids may also grow in the fallopian tubes, cervix, or tissues (ligaments) near the uterus. You may have one or many fibroids. Fibroids vary in size, weight, and where they grow in the uterus. Some can become quite large. Most fibroids do not require medical treatment. What are the causes? The cause of this condition is not known. What increases the risk? You are more likely to develop this condition if you:  Are in your 30s or 40s and have not gone through menopause.  Have a family history of this condition.  Are of African American descent.  Started your menstrual period at age 29 or younger.  Have never given birth.  Are overweight or obese. What are the signs or symptoms? Many women do not have any symptoms. Symptoms of this condition may include:  Heavy menstrual bleeding.  Bleeding between menstrual periods.  Pain and pressure in the pelvic area, between your hip bones.  Pain during sex.  Bladder problems, such as needing to urinate right away or more often than usual.  Inability to have children (infertility).  Failure to carry pregnancy to term (miscarriage). How is this diagnosed? This condition may be diagnosed based on:  Your symptoms and medical history.  A physical exam.  A pelvic exam that includes feeling for any tumors.  Imaging tests, such as ultrasound or MRI. How is this treated? Treatment for this condition may include follow-up visits with your health care provider to monitor your fibroids for any changes. Other treatment  may include:  Medicines, such as: ? Medicines to relieve pain, including aspirin and NSAIDs, such as ibuprofen or naproxen. ? Hormone therapy. Treatment may be given as a pill or an injection, or it may be inserted into the uterus using an intrauterine device (IUD).  Surgery that would do one of the following: ? Remove the fibroids (myomectomy). This may be recommended if fibroids affect your fertility and you want to become pregnant. ? Remove the uterus (hysterectomy). ? Block the blood supply to the fibroids (uterine artery embolization). This can cause them to shrink and die. Follow these instructions at home: Medicines  Take over-the-counter and prescription medicines only as told by your health care provider.  Ask your health care provider if you should take iron pills or eat more iron-rich foods, such as dark green, leafy vegetables. Heavy menstrual bleeding can cause low iron levels. Managing pain If directed, apply heat to your back or abdomen to reduce pain. Use the heat source that your health care provider recommends, such as a moist heat pack or a heating pad. To apply heat:  Place a towel between your skin and the heat source.  Leave the heat on for 20-30 minutes.  Remove the heat if your skin turns bright red. This is especially important if you are unable to feel pain, heat, or cold. You may have a greater risk of getting burned.   General instructions  Pay close attention to your menstrual cycle. Tell your health care provider about any changes, such as: ? Heavier bleeding that requires you to change your pads or  tampons more than usual. ? A change in the number of days that your menstrual period lasts. ? A change in symptoms that come with your menstrual period, such as back pain or cramps in your abdomen.  Keep all follow-up visits. This is important, especially if your fibroids need to be monitored for any changes. Contact a health care provider if you:  Have  pelvic pain, back pain, or cramps in your abdomen that do not get better with medicine or heat.  Develop new bleeding between menstrual periods.  Have increased bleeding during or between menstrual periods.  Feel more tired or weak than usual.  Feel light-headed. Get help right away if you:  Faint.  Have pelvic pain that suddenly gets worse.  Have severe vaginal bleeding that soaks a tampon or pad in 30 minutes or less. Summary  Uterine fibroids are noncancerous (benign) tumors that can develop in the uterus.  The exact cause of this condition is not known.  Most fibroids do not require medical treatment unless they affect your ability to have children (fertility).  Contact a health care provider if you have pelvic pain, back pain, or cramps in your abdomen that do not get better with medicines.  Get help right away if you faint, have pelvic pain that suddenly gets worse, or have severe vaginal bleeding. This information is not intended to replace advice given to you by your health care provider. Make sure you discuss any questions you have with your health care provider. Document Revised: 04/21/2020 Document Reviewed: 04/21/2020 Elsevier Patient Education  [place].

## 2021-02-18 NOTE — Progress Notes (Signed)
Uterine Fibroids Patient is a 40 yo G1P1 (prior NSVD 23 years ago) who presents with recent diagnosis of uterine fibroids, seen for menorrhagia pain and noted anemia requiring blood transfusion 2 weeks ago. Periods are regular every 28-30 days, lasting 7 days.  Heavy most days.  This has been consistent for many years.  Dysmenorrhea:moderate, occurring throughout menses. Cyclic symptoms include none. No intermenstrual bleeding, spotting, or discharge.  No prior knowledge of having fibroids.  Prior CS 2018 no fibroids, then CT and [place] recently w 3 dominant and several smaller fibroids.  Provera 20 mg daily has stopped bleeding at this time.  [place] 02/11/21: Measurements: 9.3 x 6.2 x 7.0 cm = volume: 212 mL. Uterus is retroflexed. Multiple uterine fibroids are seen, largest of which are measured: 1. 2.0 x 2.3 x 1.9 cm intramural fibroid present at the right posterior uterine body. 2. 2.9 x 2.9 x 2.0 cm intramural fibroid at the adjacent right posterior uterine body. 3. 3.7 x 3.2 x 2.8 cm intramural fibroid at the central uterine fundus. Endometrium Thickness: 16 mm. Heterogeneous echotexture without definite focal lesion. Associated vascularity seen with color Doppler imaging.  PMHx: She  has a past medical history of Anemia, GERD (gastroesophageal reflux disease), and Sickle cell trait ([place]). Also,  has a past surgical history that includes Esophagogastroduodenoscopy (egd) with propofol (N/A, 02/26/2016)., family history includes Colon cancer in her maternal grandfather; Diabetes in her mother; Hypertension in her mother; Lung cancer in her father and maternal grandmother.,  reports that she has been smoking cigarettes. She has been smoking about 0.25 packs per day. She has never used smokeless tobacco. She reports previous alcohol use. She reports current drug use. Drug: Marijuana.  She has a current medication list which includes the following prescription(s): acetaminophen-codeine, docusate  sodium, medroxyprogesterone, and [DISCONTINUED] ferrous sulfate. Also, has No Known Allergies.  Review of Systems  Constitutional: Negative for chills, fever and malaise/fatigue.  HENT: Negative for congestion, sinus pain and sore throat.   Eyes: Negative for blurred vision and pain.  Respiratory: Negative for cough and wheezing.   Cardiovascular: Negative for chest pain and leg swelling.  Gastrointestinal: Negative for abdominal pain, constipation, diarrhea, heartburn, nausea and vomiting.  Genitourinary: Negative for dysuria, frequency, hematuria and urgency.  Musculoskeletal: Negative for back pain, joint pain, myalgias and neck pain.  Skin: Negative for itching and rash.  Neurological: Negative for dizziness, tremors and weakness.  Endo/Heme/Allergies: Does not bruise/bleed easily.  Psychiatric/Behavioral: Negative for depression. The patient is not nervous/anxious and does not have insomnia.     Objective: BP 120/80   Ht 5\' 8"  (1.727 m)   Wt 173 lb (78.5 kg)   LMP 01/28/2021   BMI 26.30 kg/m  Physical Exam Constitutional:      General: She is not in acute distress.    Appearance: She is well-developed.  Genitourinary:     Bladder and urethral meatus normal.     No vaginal erythema or bleeding.      Right Adnexa: not tender and no mass present.    Left Adnexa: not tender and no mass present.    No cervical motion tenderness, discharge, polyp or nabothian cyst.     Uterus is enlarged.     Uterine mass present.    Uterus exam comments: Multiglobular uterine shape and size (8 weeks).     Uterus is retroverted.     Pelvic exam was performed with patient in the lithotomy position.  HENT:     Head: Normocephalic and  atraumatic.     Nose: Nose normal.  Abdominal:     General: There is no distension.     Palpations: Abdomen is soft.     Tenderness: There is no abdominal tenderness.  Musculoskeletal:        General: Normal range of motion.  Neurological:     Mental  Status: She is alert and oriented to person, place, and time.     Cranial Nerves: No cranial nerve deficit.  Skin:    General: Skin is warm and dry.  Psychiatric:        Attention and Perception: Attention normal.        Mood and Affect: Mood and affect normal.        Speech: Speech normal.        Behavior: Behavior normal.        Thought Content: Thought content normal.        Judgment: Judgment normal.    Endometrial Biopsy After discussion with the patient regarding her abnormal uterine bleeding I recommended that she proceed with an endometrial biopsy for further diagnosis. The risks, benefits, alternatives, and indications for an endometrial biopsy were discussed with the patient in detail. She understood the risks including infection, bleeding, cervical laceration and uterine perforation.  Verbal consent was obtained.   PROCEDURE NOTE:  [place] endometrial biopsy was performed using aseptic technique with iodine preparation.  The uterus was sounded to a length of 9 cm.  Adequate sampling was obtained with minimal blood loss.  The patient tolerated the procedure well.  Disposition will be pending pathology.  ASSESSMENT/PLAN:    Problem List Items Addressed This Visit      Other   Iron deficiency anemia due to chronic blood loss   Relevant Orders   CBC   Menorrhagia with regular cycle   Relevant Orders   Surgical pathology   Fibroid - Primary   Relevant Orders   Surgical pathology    Other Visit Diagnoses    Screening for cervical cancer       Relevant Orders   Cytology - PAP    A systematic review was performed to evaluate if uterine myomas impact the likelihood of pregnancy and pregnancy loss, and if myomectomy influences pregnancy outcomes in asymptomatic women. There is insufficient evidence to conclude that the presence of myomas reduces the likelihood of achieving pregnancy. However, there is fair evidence that myomectomy (open or laparoscopic) for cavity-distorting  myomas (intramural or intramural with a submucosal component) improves pregnancy rates and reduces the risk of early pregnancy loss. There is fair evidence that hysteroscopic myomectomy for cavity-distorting myomas improves clinical pregnancy rates but insufficient evidence regarding the impact of this procedure on the likelihood of live birth or early pregnancy loss. In women with asymptomatic cavity-distorting myomas, myomectomy may be considered to optimize pregnancy outcomes.  Fibroid treatment such as [place], Lupron, Myomectomy, and Hysterectomy discussed in detail, with the pros and cons of each choice counseled.  No treatment as an option also discussed, as well as control of symptoms alone with hormone therapy. Information provided to the patient.  Desires to preserve fertility; in fact trying for pregnancy now (and for last 8 mos)  Plan repeat exam w [place] in 4-6 mos to assess for rapid growth, and to see how sx's are for her (anemia, pain, pressure, menorrhagia) amd to see about fertility  Also consider HSG to assess tubal function/ obstruction  Barnett Applebaum, MD, [facility] 02/18/2021  9:20 AM

## 2021-02-19 ENCOUNTER — Encounter: Payer: Self-pay | Admitting: Obstetrics

## 2021-02-19 ENCOUNTER — Other Ambulatory Visit: Payer: Medicaid Other

## 2021-02-19 LAB — CYTOLOGY - PAP
Comment: NEGATIVE
Diagnosis: NEGATIVE
High risk HPV: NEGATIVE

## 2021-02-19 LAB — SURGICAL PATHOLOGY

## 2021-02-19 NOTE — Progress Notes (Signed)
Let her know biopsy normal, no cancer.  Will monitor bleeding symptoms and for pregnancy as well.

## 2021-02-19 NOTE — Progress Notes (Signed)
Pt aware.

## 2021-03-16 ENCOUNTER — Other Ambulatory Visit: Payer: Self-pay | Admitting: Obstetrics & Gynecology

## 2021-03-16 DIAGNOSIS — D219 Benign neoplasm of connective and other soft tissue, unspecified: Secondary | ICD-10-CM

## 2021-03-24 ENCOUNTER — Ambulatory Visit (HOSPITAL_COMMUNITY): Admission: RE | Admit: 2021-03-24 | Payer: Self-pay | Source: Ambulatory Visit

## 2021-03-26 ENCOUNTER — Ambulatory Visit: Payer: Medicaid Other | Admitting: Obstetrics

## 2021-04-06 ENCOUNTER — Ambulatory Visit: Payer: Medicaid Other | Admitting: Obstetrics

## 2021-04-12 ENCOUNTER — Ambulatory Visit: Payer: Medicaid Other

## 2021-04-13 ENCOUNTER — Ambulatory Visit: Payer: Medicaid Other

## 2021-04-15 ENCOUNTER — Ambulatory Visit: Payer: Medicaid Other

## 2021-05-03 ENCOUNTER — Telehealth: Payer: Self-pay | Admitting: Pharmacist

## 2021-05-03 NOTE — Telephone Encounter (Signed)
Patient failed to provide requested 2022 financial documentation. No additional medication assistance will be provided by [facility] without the required proof of income documentation. Patient notified by letter.  [place]

## 2021-05-04 ENCOUNTER — Other Ambulatory Visit: Payer: Self-pay

## 2021-05-11 ENCOUNTER — Other Ambulatory Visit: Payer: Self-pay | Admitting: Obstetrics & Gynecology

## 2021-05-11 DIAGNOSIS — D219 Benign neoplasm of connective and other soft tissue, unspecified: Secondary | ICD-10-CM

## 2021-05-26 ENCOUNTER — Other Ambulatory Visit: Payer: Self-pay

## 2021-05-26 ENCOUNTER — Ambulatory Visit (LOCAL_COMMUNITY_HEALTH_CENTER): Payer: Self-pay | Admitting: Advanced Practice Midwife

## 2021-05-26 NOTE — Progress Notes (Signed)
Pt left without being seen by provider.

## 2021-06-03 ENCOUNTER — Other Ambulatory Visit: Payer: Self-pay

## 2021-06-03 ENCOUNTER — Emergency Department
Admission: EM | Admit: 2021-06-03 | Discharge: 2021-06-03 | Disposition: A | Discharge status: Home / Self Care | Payer: Medicaid Other | Attending: Emergency Medicine | Admitting: Emergency Medicine

## 2021-06-03 DIAGNOSIS — F17210 Nicotine dependence, cigarettes, uncomplicated: Secondary | ICD-10-CM | POA: Insufficient documentation

## 2021-06-03 DIAGNOSIS — Z20822 Contact with and (suspected) exposure to covid-19: Secondary | ICD-10-CM | POA: Insufficient documentation

## 2021-06-03 DIAGNOSIS — J0100 Acute maxillary sinusitis, unspecified: Secondary | ICD-10-CM | POA: Insufficient documentation

## 2021-06-03 LAB — RESP PANEL BY RT-PCR (FLU A&B, COVID) ARPGX2
Influenza A by PCR: NEGATIVE
Influenza B by PCR: NEGATIVE
SARS Coronavirus 2 by RT PCR: NEGATIVE

## 2021-06-03 MED ORDER — LORATADINE 10 MG PO TABS: 10.0000 mg | ORAL_TABLET | Freq: Every day | ORAL | 2 refills | Status: DC

## 2021-06-03 MED ORDER — MOMETASONE FUROATE 50 MCG/ACT NA SUSP: 2.0000 | Freq: Every day | NASAL | 2 refills | Status: DC

## 2021-06-03 NOTE — Discharge Instructions (Addendum)
Follow-up with your regular doctor if not improving to 3 days.  Return emergency department worsening. Take over-the-counter Mucinex Use Claritin and the nasal spray as prescribed

## 2021-06-03 NOTE — ED Notes (Signed)
See triage note  Presents with  Pressure behind eyes and sore throat  States she has had subjective at home but is  afebrile on arrival

## 2021-06-03 NOTE — ED Provider Notes (Signed)
[facility] Emergency Department Provider Note  ____________________________________________   Event Date/Time   First MD Initiated Contact with Patient 06/03/21 [phone]     (approximate)  I have reviewed the triage vital signs and the nursing notes.   HISTORY  Chief Complaint covid sx    HPI Carolyn [name] is a 40 y.o. female presents to the emergency department with URI symptoms for 2 days.   Is complaining of cough, congestion, fever, chills, denies chest pain, shortness of breath close contact with Covid19+ patient, patient is vaccinated.   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   Iron deficiency anemia due to chronic blood loss    Gastric ulcer without hemorrhage or perforation but with obstruction    Duodenal ulceration    GI bleed due to NSAIDs    Symptomatic anemia 02/24/2016   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 Authorizing Provider  loratadine (CLARITIN) 10 MG tablet Take 1 tablet (10 mg total) by mouth daily. 06/03/21 06/03/22 Yes Dimitriy Carreras, Linden Dolin, PA-C   mometasone (NASONEX) 50 MCG/ACT nasal spray Place 2 sprays into the nose daily. 06/03/21 06/03/22 Yes Mahek Schlesinger, Linden Dolin, PA-C  docusate sodium (COLACE) 100 MG capsule Take 1 capsule (100 mg total) by mouth 2 (two) times daily as needed for mild constipation. 06/26/20 06/26/21  Duffy Bruce, MD  medroxyPROGESTERone (PROVERA) 10 MG tablet Take 2 tablets (20 mg total) by mouth once daily. 02/14/21   Nita Sells, MD  ferrous sulfate 325 (65 FE) MG EC tablet Take 1 tablet (325 mg total) by mouth 2 (two) times daily. 02/13/21 02/15/21  Nita Sells, MD    Allergies Patient has no known allergies.  Family History  Problem Relation Age of Onset   Diabetes Mother    Hypertension Mother    Lung cancer Father    Lung cancer Maternal Grandmother    Colon cancer Maternal Grandfather     Social History Social History   Tobacco Use   Smoking status: Every Day    Packs/day: 0.25    Types: Cigarettes   Smokeless tobacco: Never  Vaping Use   Vaping Use: Never used  Substance Use Topics   Alcohol use: Not Currently   Drug use: Not Currently    Types: Marijuana    Review of Systems  Constitutional: Positive fever/chills Eyes: No visual changes. ENT: Positive sore throat. Respiratory: Positive cough Cardiovascular: Denies chest pain Gastrointestinal: Denies abdominal pain Genitourinary: Negative for dysuria. Musculoskeletal: Negative for back pain. Skin: Negative for rash. Neurological: Denies neurological changes    ____________________________________________   PHYSICAL EXAM:  VITAL SIGNS: ED Triage  Vitals  Enc Vitals Group     BP 06/03/21 0902 117/81     Pulse Rate 06/03/21 0902 78     Resp 06/03/21 0902 18     Temp 06/03/21 0902 98 F (36.7 C)     Temp Source 06/03/21 0902 Oral     SpO2 06/03/21 0902 100 %     Weight 06/03/21 0900 189 lb (85.7 kg)     Height 06/03/21 0900 '5\' 8"'$  (1.727 m)     Head Circumference --      Peak Flow --      Pain Score 06/03/21  0900 7     Pain Loc --      Pain Edu? --      Excl. in [place]? --     Constitutional: Alert and oriented. Well appearing and in no acute distress. Eyes: Conjunctivae are normal.  Head: Atraumatic. Nose: No congestion/rhinnorhea. Mouth/Throat: Mucous membranes are moist.  Throat appears normal Neck:  supple no lymphadenopathy noted Cardiovascular: Normal rate, regular rhythm. Heart sounds are normal Respiratory: Normal respiratory effort.  No retractions, lungs CTA GU: deferred Musculoskeletal: FROM all extremities, warm and well perfused Neurologic:  Normal speech and language.  Skin:  Skin is warm, dry and intact. No rash noted. Psychiatric: Mood and affect are normal. Speech and behavior are normal.  ____________________________________________   LABS (all labs ordered are listed, but only abnormal results are displayed)  Labs Reviewed  RESP PANEL BY RT-PCR (FLU A&B, COVID) [id number]   ____________________________________________   ____________________________________________  RADIOLOGY    ____________________________________________   PROCEDURES  Procedure(s) performed: No  Procedures    ____________________________________________   INITIAL IMPRESSION / ASSESSMENT AND PLAN / ED COURSE  Pertinent labs & imaging results that were available during my care of the patient were reviewed by me and considered in my medical decision making (see chart for details).   Patient is a 40 year old female who complains of URI symptoms.  Exam is consistent with covid/URI/sinusitis/influenza  Pending test for covid/flu  Patient's respiratory panel for COVID/influenza are both negative  Patient was given prescription for Claritin and Nasonex.  She is to buy over-the-counter Mucinex.  Return emergency department if worsening.  See her regular doctor acute care if not improving in 2 to 3 days.  She is discharged stable condition.     Carolyn [name] was evaluated in  Emergency Department on 06/03/2021 for the symptoms described in the history of present illness. She was evaluated in the context of the global COVID-19 pandemic, which necessitated consideration that the patient might be at risk for infection with the SARS-CoV-2 virus that causes COVID-19. Institutional protocols and algorithms that pertain to the evaluation of patients at risk for COVID-19 are in a state of rapid change based on information released by regulatory bodies including the CDC and federal and state organizations. These policies and algorithms were followed during the patient's care in the ED.   As part of my medical decision making, I reviewed the following data within the [place] notes reviewed and incorporated, Labs reviewed , Old chart reviewed, Notes from prior ED visits, and [place] Controlled Substance Database  ____________________________________________   FINAL CLINICAL IMPRESSION(S) / ED DIAGNOSES  Final diagnoses:  Acute maxillary sinusitis, recurrence not specified      NEW MEDICATIONS STARTED DURING THIS VISIT:  New Prescriptions   LORATADINE (CLARITIN) 10 MG TABLET    Take 1 tablet (10 mg total) by mouth daily.   MOMETASONE (NASONEX)  50 MCG/ACT NASAL SPRAY    Place 2 sprays into the nose daily.     Note:  This document was prepared using Dragon voice recognition software and may include unintentional dictation errors.    Versie Starks, PA-C 06/03/21 1034    Harvest Dark, MD 06/03/21 1446

## 2021-06-03 NOTE — ED Triage Notes (Signed)
Pt to ED for sore throat, congestion for past few days. Denies fevers

## 2021-06-21 ENCOUNTER — Ambulatory Visit: Payer: Self-pay

## 2021-07-16 ENCOUNTER — Emergency Department: Payer: Self-pay

## 2021-07-16 ENCOUNTER — Emergency Department
Admission: EM | Admit: 2021-07-16 | Discharge: 2021-07-16 | Disposition: A | Discharge status: Home / Self Care | Payer: Self-pay | Attending: Emergency Medicine | Admitting: Emergency Medicine

## 2021-07-16 ENCOUNTER — Other Ambulatory Visit: Payer: Self-pay

## 2021-07-16 ENCOUNTER — Encounter: Payer: Self-pay | Admitting: Emergency Medicine

## 2021-07-16 DIAGNOSIS — S67192A Crushing injury of right middle finger, initial encounter: Secondary | ICD-10-CM | POA: Insufficient documentation

## 2021-07-16 DIAGNOSIS — F17210 Nicotine dependence, cigarettes, uncomplicated: Secondary | ICD-10-CM | POA: Insufficient documentation

## 2021-07-16 DIAGNOSIS — W230XXA Caught, crushed, jammed, or pinched between moving objects, initial encounter: Secondary | ICD-10-CM | POA: Insufficient documentation

## 2021-07-16 MED ORDER — NAPROXEN 500 MG PO TABS
500.0000 mg | ORAL_TABLET | Freq: Once | ORAL | Status: AC
  Administered 2021-07-16: 500 mg via ORAL
  Filled 2021-07-16: qty 1

## 2021-07-16 MED ORDER — HYDROCODONE-ACETAMINOPHEN 5-325 MG PO TABS
1.0000 | ORAL_TABLET | Freq: Once | ORAL | Status: AC
  Administered 2021-07-16: 1 via ORAL
  Filled 2021-07-16: qty 1

## 2021-07-16 NOTE — ED Triage Notes (Signed)
Presents with pain to right hand  states shut hand in door

## 2021-07-16 NOTE — Discharge Instructions (Signed)
Ice off and on throughout the day.  Take Ibuprofen 600mg  every 8 hours.  Return to the ER for symptoms that change or worsen if unable to schedule an appointment with primary care.

## 2021-07-16 NOTE — ED Provider Notes (Signed)
[facility] Emergency Department Provider Note ____________________________________________  Time seen: Approximately 12:40 PM  I have reviewed the triage vital signs and the nursing notes.   HISTORY  Chief Complaint Hand Injury    HPI Carolyn [name] is a 40 y.o. female who presents to the emergency department for evaluation and treatment of pain to the right middle finger.  She accidentally shut in the door today.  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   Iron deficiency anemia due to chronic blood loss    Gastric ulcer without hemorrhage or perforation but with obstruction    Duodenal ulceration    GI bleed due to NSAIDs    Symptomatic anemia 02/24/2016   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 Authorizing Provider  loratadine (CLARITIN) 10 MG tablet Take 1 tablet (10 mg total) by mouth daily. 06/03/21 06/03/22  Fisher, Linden Dolin, PA-C  medroxyPROGESTERone (PROVERA) 10 MG tablet Take 2 tablets (20 mg total) by mouth once daily.  02/14/21   Nita Sells, MD  mometasone (NASONEX) 50 MCG/ACT nasal spray Place 2 sprays into the nose daily. 06/03/21 06/03/22  Fisher, Linden Dolin, PA-C  ferrous sulfate 325 (65 FE) MG EC tablet Take 1 tablet (325 mg total) by mouth 2 (two) times daily. 02/13/21 02/15/21  Nita Sells, MD    Allergies Patient has no known allergies.  Family History  Problem Relation Age of Onset   Diabetes Mother    Hypertension Mother    Lung cancer Father    Lung cancer Maternal Grandmother    Colon cancer Maternal Grandfather     Social History Social History   Tobacco Use   Smoking status: Every Day    Packs/day: 0.25    Types: Cigarettes   Smokeless tobacco: Never  Vaping Use   Vaping Use: Never used  Substance Use Topics   Alcohol use: Not Currently   Drug use: Not Currently    Types: Marijuana    Review of Systems Constitutional: Negative for fever. Cardiovascular: Negative for chest pain. Respiratory: Negative for shortness of breath. Musculoskeletal: Positive for pain and swelling right middle finger. Skin: No open wounds or lesions. Neurological: Negative for decrease in sensation  ____________________________________________   PHYSICAL EXAM:  VITAL SIGNS: ED Triage Vitals  Enc Vitals Group     BP 07/16/21 0919 119/74     Pulse Rate 07/16/21 0919 88     Resp 07/16/21 0919 16     Temp 07/16/21 0919 98.5 F (36.9 C)     Temp Source 07/16/21 0919 Oral  SpO2 07/16/21 0919 100 %     Weight 07/16/21 0915 188 lb 15 oz (85.7 kg)     Height 07/16/21 0915 5\' 8"  (1.727 m)     Head Circumference --      Peak Flow --      Pain Score 07/16/21 0920 8     Pain Loc --      Pain Edu? --      Excl. in [place]? --     Constitutional: Alert and oriented. Well appearing and in no acute distress. Eyes: Conjunctivae are clear without discharge or drainage Head: Atraumatic Neck: Supple. Respiratory: No cough. Respirations are even and unlabored. Musculoskeletal: Able to  demonstrate flexion and extension of the fingers of the right hand.  No obvious deformity of the right middle finger Neurologic: Motor and sensory function is intact. Skin: No open wounds or lesions overlying the right middle finger. Psychiatric: Affect and behavior are appropriate.  ____________________________________________   LABS (all labs ordered are listed, but only abnormal results are displayed)  Labs Reviewed - No data to display ____________________________________________  RADIOLOGY  Image of the right middle finger negative for bony abnormality.  I, Sherrie George, personally viewed and evaluated these images (plain radiographs) as part of my medical decision making, as well as reviewing the written report by the radiologist.  DG Finger Middle Right  Result Date: 07/16/2021 CLINICAL DATA:  Acute right third finger pain and swelling after crush injury. EXAM: RIGHT MIDDLE FINGER 2+V COMPARISON:  None. FINDINGS: There is no evidence of fracture or dislocation. There is no evidence of arthropathy or other focal bone abnormality. Soft tissues are unremarkable. IMPRESSION: Negative. Electronically Signed   By: Marijo Conception M.D.   On: 07/16/2021 10:39   ____________________________________________   PROCEDURES  Procedures  ____________________________________________   INITIAL IMPRESSION / ASSESSMENT AND PLAN / ED COURSE  Carolyn [name] is a 40 y.o. who presents to the emergency department for evaluation after smashing her right middle finger in the door.  X-rays negative for acute bony abnormality.  She was placed in a static finger splint.  Home care discussed.  She was encouraged to rotate Tylenol and ibuprofen as needed for pain and use ice off-and-on throughout the day.   Medications  HYDROcodone-acetaminophen (NORCO/VICODIN) 5-325 MG per tablet 1 tablet (1 tablet Oral Given 07/16/21 0945)  naproxen (NAPROSYN) tablet 500 mg (500 mg Oral Given 07/16/21 0945)     Pertinent labs & imaging results that were available during my care of the patient were reviewed by me and considered in my medical decision making (see chart for details).   _________________________________________   FINAL CLINICAL IMPRESSION(S) / ED DIAGNOSES  Final diagnoses:  Crushing injury of right middle finger, initial encounter    ED Discharge Orders     None        If controlled substance prescribed during this visit, 12 month history viewed on the [place] prior to issuing an initial prescription for Schedule II or III opiod.    Victorino Dike, FNP 07/16/21 1243    Lavonia Drafts, MD 07/16/21 1340

## 2021-07-22 ENCOUNTER — Ambulatory Visit: Payer: Medicaid Other

## 2021-07-26 ENCOUNTER — Ambulatory Visit: Payer: Medicaid Other

## 2021-08-03 ENCOUNTER — Other Ambulatory Visit: Payer: Self-pay

## 2021-08-03 DIAGNOSIS — R1084 Generalized abdominal pain: Secondary | ICD-10-CM | POA: Insufficient documentation

## 2021-08-03 DIAGNOSIS — Z5321 Procedure and treatment not carried out due to patient leaving prior to being seen by health care provider: Secondary | ICD-10-CM | POA: Insufficient documentation

## 2021-08-03 LAB — COMPREHENSIVE METABOLIC PANEL
ALT: 12 U/L (ref 0–44)
AST: 18 U/L (ref 15–41)
Albumin: 3.9 g/dL (ref 3.5–5.0)
Alkaline Phosphatase: 52 U/L (ref 38–126)
Anion gap: 8 (ref 5–15)
BUN: 11 mg/dL (ref 6–20)
CO2: 25 mmol/L (ref 22–32)
Calcium: 8.8 mg/dL — ABNORMAL LOW (ref 8.9–10.3)
Chloride: 105 mmol/L (ref 98–111)
Creatinine, Ser: 1.03 mg/dL — ABNORMAL HIGH (ref 0.44–1.00)
GFR, Estimated: >60 mL/min (ref >60)
Glucose, Bld: 152 mg/dL — ABNORMAL HIGH (ref 70–99)
Potassium: 3.7 mmol/L (ref 3.5–5.1)
Sodium: 138 mmol/L (ref 135–145)
Total Bilirubin: 0.5 mg/dL (ref 0.3–1.2)
Total Protein: 7.7 g/dL (ref 6.5–8.1)

## 2021-08-03 LAB — CBC
HCT: 25.8 % — ABNORMAL LOW (ref 36.0–46.0)
Hemoglobin: 7.6 g/dL — ABNORMAL LOW (ref 12.0–15.0)
MCH: 19.6 pg — ABNORMAL LOW (ref 26.0–34.0)
MCHC: 29.5 g/dL — ABNORMAL LOW (ref 30.0–36.0)
MCV: 66.7 fL — ABNORMAL LOW (ref 80.0–100.0)
Platelets: 328 10*3/uL (ref 150–400)
RBC: 3.87 MIL/uL (ref 3.87–5.11)
RDW: 21.9 % — ABNORMAL HIGH (ref 11.5–15.5)
WBC: 9.5 10*3/uL (ref 4.0–10.5)
nRBC: 0.2 % (ref 0.0–0.2)

## 2021-08-03 LAB — URINALYSIS, COMPLETE (UACMP) WITH MICROSCOPIC
Bacteria, UA: NONE SEEN
Bilirubin Urine: NEGATIVE
Glucose, UA: NEGATIVE mg/dL
Leukocytes,Ua: NEGATIVE
Nitrite: NEGATIVE
Protein, ur: NEGATIVE mg/dL
Specific Gravity, Urine: >1.03 — ABNORMAL HIGH (ref 1.005–1.030)
pH: 6 (ref 5.0–8.0)

## 2021-08-03 LAB — LIPASE, BLOOD: Lipase: 58 U/L — ABNORMAL HIGH (ref 11–51)

## 2021-08-03 LAB — POC URINE PREG, ED: Preg Test, Ur: NEGATIVE

## 2021-08-03 NOTE — ED Triage Notes (Signed)
Pt in generalized abd pain hx of the same and was dx with ulcers after an endoscopy. States no n.v.d or dysuira.

## 2021-08-04 ENCOUNTER — Emergency Department
Admission: EM | Admit: 2021-08-04 | Discharge: 2021-08-04 | Disposition: A | Discharge status: Home / Self Care | Payer: Medicaid Other | Attending: Emergency Medicine | Admitting: Emergency Medicine

## 2021-08-09 ENCOUNTER — Other Ambulatory Visit: Payer: Self-pay

## 2021-08-09 ENCOUNTER — Emergency Department: Payer: Self-pay

## 2021-08-09 ENCOUNTER — Encounter: Payer: Self-pay | Admitting: Radiology

## 2021-08-09 ENCOUNTER — Ambulatory Visit: Payer: Self-pay | Admitting: Physician Assistant

## 2021-08-09 ENCOUNTER — Emergency Department
Admission: EM | Admit: 2021-08-09 | Discharge: 2021-08-09 | Disposition: A | Discharge status: Home / Self Care | Payer: Self-pay | Attending: Emergency Medicine | Admitting: Emergency Medicine

## 2021-08-09 VITALS — BP 105/68 | Ht 65.5 in | Wt 164.6 lb

## 2021-08-09 DIAGNOSIS — R1084 Generalized abdominal pain: Secondary | ICD-10-CM

## 2021-08-09 DIAGNOSIS — D649 Anemia, unspecified: Secondary | ICD-10-CM | POA: Insufficient documentation

## 2021-08-09 DIAGNOSIS — F411 Generalized anxiety disorder: Secondary | ICD-10-CM

## 2021-08-09 DIAGNOSIS — Z113 Encounter for screening for infections with a predominantly sexual mode of transmission: Secondary | ICD-10-CM

## 2021-08-09 DIAGNOSIS — N83201 Unspecified ovarian cyst, right side: Secondary | ICD-10-CM | POA: Insufficient documentation

## 2021-08-09 DIAGNOSIS — D259 Leiomyoma of uterus, unspecified: Secondary | ICD-10-CM | POA: Insufficient documentation

## 2021-08-09 DIAGNOSIS — F17210 Nicotine dependence, cigarettes, uncomplicated: Secondary | ICD-10-CM | POA: Insufficient documentation

## 2021-08-09 DIAGNOSIS — R102 Pelvic and perineal pain: Secondary | ICD-10-CM | POA: Insufficient documentation

## 2021-08-09 DIAGNOSIS — K8020 Calculus of gallbladder without cholecystitis without obstruction: Secondary | ICD-10-CM | POA: Insufficient documentation

## 2021-08-09 DIAGNOSIS — E876 Hypokalemia: Secondary | ICD-10-CM | POA: Insufficient documentation

## 2021-08-09 LAB — URINALYSIS, ROUTINE W REFLEX MICROSCOPIC
Bilirubin Urine: NEGATIVE
Glucose, UA: NEGATIVE mg/dL
Hgb urine dipstick: NEGATIVE
Ketones, ur: 5 mg/dL — AB
Leukocytes,Ua: NEGATIVE
Nitrite: NEGATIVE
Protein, ur: NEGATIVE mg/dL
Specific Gravity, Urine: 1.028 (ref 1.005–1.030)
pH: 6 (ref 5.0–8.0)

## 2021-08-09 LAB — CBC
HCT: 26.8 % — ABNORMAL LOW (ref 36.0–46.0)
Hemoglobin: 7.5 g/dL — ABNORMAL LOW (ref 12.0–15.0)
MCH: 18.6 pg — ABNORMAL LOW (ref 26.0–34.0)
MCHC: 28 g/dL — ABNORMAL LOW (ref 30.0–36.0)
MCV: 66.3 fL — ABNORMAL LOW (ref 80.0–100.0)
Platelets: 262 10*3/uL (ref 150–400)
RBC: 4.04 MIL/uL (ref 3.87–5.11)
RDW: 21.4 % — ABNORMAL HIGH (ref 11.5–15.5)
WBC: 9.1 10*3/uL (ref 4.0–10.5)
nRBC: 0 % (ref 0.0–0.2)

## 2021-08-09 LAB — WET PREP FOR TRICH, YEAST, CLUE
Trichomonas Exam: NEGATIVE
Yeast Exam: NEGATIVE

## 2021-08-09 LAB — COMPREHENSIVE METABOLIC PANEL
ALT: 11 U/L (ref 0–44)
AST: 16 U/L (ref 15–41)
Albumin: 3.6 g/dL (ref 3.5–5.0)
Alkaline Phosphatase: 53 U/L (ref 38–126)
Anion gap: 8 (ref 5–15)
BUN: 9 mg/dL (ref 6–20)
CO2: 25 mmol/L (ref 22–32)
Calcium: 8.8 mg/dL — ABNORMAL LOW (ref 8.9–10.3)
Chloride: 105 mmol/L (ref 98–111)
Creatinine, Ser: 0.75 mg/dL (ref 0.44–1.00)
GFR, Estimated: >60 mL/min (ref >60)
Glucose, Bld: 94 mg/dL (ref 70–99)
Potassium: 3.2 mmol/L — ABNORMAL LOW (ref 3.5–5.1)
Sodium: 138 mmol/L (ref 135–145)
Total Bilirubin: 0.7 mg/dL (ref 0.3–1.2)
Total Protein: 7.2 g/dL (ref 6.5–8.1)

## 2021-08-09 LAB — POC URINE PREG, ED: Preg Test, Ur: NEGATIVE

## 2021-08-09 LAB — LIPASE, BLOOD: Lipase: 38 U/L (ref 11–51)

## 2021-08-09 MED ORDER — POTASSIUM CHLORIDE CRYS ER 20 MEQ PO TBCR
40.0000 meq | EXTENDED_RELEASE_TABLET | Freq: Once | ORAL | Status: AC
  Administered 2021-08-09: 40 meq via ORAL
  Filled 2021-08-09: qty 2

## 2021-08-09 MED ORDER — LACTATED RINGERS IV BOLUS
1000.0000 mL | Freq: Once | INTRAVENOUS | Status: AC
  Administered 2021-08-09: 1000 mL via INTRAVENOUS

## 2021-08-09 MED ORDER — ACETAMINOPHEN 500 MG PO TABS
1000.0000 mg | ORAL_TABLET | Freq: Once | ORAL | Status: AC
  Administered 2021-08-09: 1000 mg via ORAL
  Filled 2021-08-09: qty 2

## 2021-08-09 MED ORDER — IOHEXOL 300 MG/ML  SOLN
100.0000 mL | Freq: Once | INTRAMUSCULAR | Status: AC | PRN
  Administered 2021-08-09: 100 mL via INTRAVENOUS
  Filled 2021-08-09: qty 100

## 2021-08-09 MED ORDER — ONDANSETRON HCL 4 MG/2ML IJ SOLN
4.0000 mg | Freq: Once | INTRAMUSCULAR | Status: AC
  Administered 2021-08-09: 4 mg via INTRAVENOUS
  Filled 2021-08-09: qty 2

## 2021-08-09 MED ORDER — KETOROLAC TROMETHAMINE 30 MG/ML IJ SOLN
15.0000 mg | Freq: Once | INTRAMUSCULAR | Status: AC
  Administered 2021-08-09: 15 mg via INTRAVENOUS
  Filled 2021-08-09: qty 1

## 2021-08-09 MED ORDER — MORPHINE SULFATE (PF) 4 MG/ML IV SOLN
4.0000 mg | Freq: Once | INTRAVENOUS | Status: AC
  Administered 2021-08-09: 4 mg via INTRAVENOUS
  Filled 2021-08-09: qty 1

## 2021-08-09 NOTE — Discharge Instructions (Addendum)
Your Ultrasound today showed: 1. Negative for ovarian torsion. 2. 3.7 cm indeterminate but probably benign complex cyst in the right ovary, probably hemorrhagic cysts but recommend 6-12 week sonographic follow-up. 3. Multiple uterine fibroids 4. Small free fluid in the pelvis  Your CT today showed: 1. No acute abdominopelvic findings. Normal appendix. 2. Cholelithiasis without evidence of acute cholecystitis. 3. Retroverted uterus containing numerous fibroids. 4. 4.0 cm right adnexal cyst. No follow-up imaging recommended. Note: This recommendation does not apply to premenarchal patients and to those with increased risk (genetic, family history, elevated tumor markers or other high-risk factors) of ovarian cancer. Reference: JACR 2020 Feb; 17(2):248-254 5. Small volume free fluid within the pelvis, likely physiologic.   S2 discussed is very important that you begin taking MiraLAX and some Colace for constipation.  I recommend 1 g Tylenol over 6 hours.  And if needed ibuprofen for breakthrough pain.  You are very anemic today and please be aware that ibuprofen can increase risk for bleeding and so if you develop any dizziness, lightheadedness, chest pain or worsening of pain must return immediately to emergency room.

## 2021-08-09 NOTE — ED Provider Notes (Signed)
[facility] Emergency Department Provider Note  ____________________________________________   Event Date/Time   First MD Initiated Contact with Patient 08/09/21 1445     (approximate)  I have reviewed the triage vital signs and the nursing notes.   HISTORY  Chief Complaint Abdominal Pain   HPI Carolyn [name] is a 40 y.o. female with past medical history of anemia, GERD, and sickle cell trait who presents to the emergency room after being referred from outpatient public health clinic where she was initially seen earlier today with concern for possible appendicitis.  Patient states she had had some abdominal pain for the past 3 days and developed nausea today.  He states it is both above and below her umbilicus and radiates around both flanks.  She has not had any abnormal vaginal bleeding or discharge or burning with urination.  She states she is feeling well constipated has not had a bowel movement 2 days.  She denies any blood in her urine or stool.  She states that pelvic exam at the clinic and was told they were not worried about any infection in her pelvis but were worried about appendicitis.  She has never had abdominal surgery before.  She is denying any fevers, chills, headache, earache, sore throat, chest pain, cough, vomiting or other acute associated sick symptoms.  No significant NSAID use, EtOH use or illicit drug use.  No other acute concerns at this time.         Past Medical History:  Diagnosis Date   Anemia    GERD (gastroesophageal reflux disease)    Sickle cell trait ([place])     Patient Active Problem List   Diagnosis Date Noted   Fibroid 02/18/2021   Acute blood loss anemia 02/12/2021   History of gastric ulcer 02/12/2021   History of GI bleed 02/12/2021   Endometrial stripe increased 02/12/2021   Vaginal bleeding 02/12/2021   Intramural leiomyoma of uterus    Menorrhagia with regular cycle 03/20/2018   Generalized anxiety  disorder 11/28/2016   Major depressive disorder, single episode, moderate ([place]) 11/28/2016   Iron deficiency anemia due to chronic blood loss    Gastric ulcer without hemorrhage or perforation but with obstruction    Duodenal ulceration    GI bleed due to NSAIDs    Symptomatic anemia 02/24/2016   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 Authorizing Provider  loratadine (CLARITIN) 10 MG tablet Take 1 tablet (10 mg total) by mouth daily. 06/03/21 06/03/22  Fisher, Linden Dolin, PA-C  medroxyPROGESTERone (PROVERA) 10 MG tablet Take 2 tablets (20 mg total) by mouth once daily. 02/14/21   Nita Sells, MD  mometasone (NASONEX) 50 MCG/ACT nasal spray Place 2 sprays into the nose daily. 06/03/21 06/03/22  Fisher, Linden Dolin, PA-C  ferrous sulfate 325 (65 FE) MG EC tablet Take 1 tablet (325 mg total) by mouth 2 (two) times daily. 02/13/21 02/15/21  Nita Sells, MD    Allergies Patient has no known allergies.  Family History  Problem Relation Age of Onset   Diabetes Mother    Hypertension Mother    Lung cancer Father    Lung cancer Maternal Grandmother  Colon cancer Maternal Grandfather     Social History Social History   Tobacco Use   Smoking status: Every Day    Packs/day: 0.25    Types: Cigarettes   Smokeless tobacco: Never  Vaping Use   Vaping Use: Never used  Substance Use Topics   Alcohol use: Not Currently   Drug use: Not Currently    Types: Marijuana    Review of Systems  Review of Systems  Constitutional:  Negative for chills and fever.  HENT:  Negative for sore throat.   Eyes:  Negative for pain.   Respiratory:  Negative for cough and stridor.   Cardiovascular:  Negative for chest pain.  Gastrointestinal:  Positive for abdominal pain, constipation and nausea. Negative for vomiting.  Genitourinary:  Negative for dysuria.  Musculoskeletal:  Negative for myalgias.  Skin:  Negative for rash.  Neurological:  Negative for seizures, loss of consciousness and headaches.  Psychiatric/Behavioral:  Negative for suicidal ideas.   All other systems reviewed and are negative.    ____________________________________________   PHYSICAL EXAM:  VITAL SIGNS: ED Triage Vitals  Enc Vitals Group     BP 08/09/21 1422 106/72     Pulse Rate 08/09/21 1422 74     Resp 08/09/21 1422 16     Temp 08/09/21 1422 99 F (37.2 C)     Temp Source 08/09/21 1422 Oral     SpO2 08/09/21 1422 100 %     Weight 08/09/21 1425 165 lb 5.5 oz (75 kg)     Height 08/09/21 1425 5\' 5"  (1.651 m)     Head Circumference --      Peak Flow --      Pain Score 08/09/21 1425 10     Pain Loc --      Pain Edu? --      Excl. in [place]? --    Vitals:   08/09/21 1422  BP: 106/72  Pulse: 74  Resp: 16  Temp: 99 F (37.2 C)  SpO2: 100%   Physical Exam Vitals and nursing note reviewed.  Constitutional:      General: She is not in acute distress.    Appearance: She is well-developed. She is obese.  HENT:     Head: Normocephalic and atraumatic.     Right Ear: External ear normal.     Left Ear: External ear normal.     Nose: Nose normal.  Eyes:     Conjunctiva/sclera: Conjunctivae normal.  Cardiovascular:     Rate and Rhythm: Normal rate and regular rhythm.     Heart sounds: No murmur heard. Pulmonary:     Effort: Pulmonary effort is normal. No respiratory distress.     Breath sounds: Normal breath sounds.  Abdominal:     Palpations: Abdomen is soft.     Tenderness: There is generalized abdominal tenderness. There is right CVA tenderness and left CVA tenderness.  Musculoskeletal:     Cervical back: Neck supple.   Skin:    General: Skin is warm and dry.     Capillary Refill: Capillary refill takes less than 2 seconds.  Neurological:     Mental Status: She is alert and oriented to person, place, and time.  Psychiatric:        Mood and Affect: Mood normal.     ____________________________________________   LABS (all labs ordered are listed, but only abnormal results are displayed)  Labs Reviewed  COMPREHENSIVE METABOLIC PANEL - Abnormal; Notable for the following components:  Result Value   Potassium 3.2 (*)    Calcium 8.8 (*)    All other components within normal limits  CBC - Abnormal; Notable for the following components:   Hemoglobin 7.5 (*)    HCT 26.8 (*)    MCV 66.3 (*)    MCH 18.6 (*)    MCHC 28.0 (*)    RDW 21.4 (*)    All other components within normal limits  URINALYSIS, ROUTINE W REFLEX MICROSCOPIC - Abnormal; Notable for the following components:   Color, Urine YELLOW (*)    APPearance HAZY (*)    Ketones, ur 5 (*)    All other components within normal limits  LIPASE, BLOOD  POC URINE PREG, ED   ____________________________________________  EKG  ____________________________________________  RADIOLOGY  ED MD interpretation: CT abdomen pelvis shows no evidence of an appendicitis, diverticulitis, kidney stone, perinephric stranding, pancreatitis or cholecystitis.  There is evidence of cholelithiasis without cholecystitis.  Patient is noted to have numerous fibroids in the uterus and a right-sided adnexal cyst.  There is a small volume of free fluid within the pelvis likely physiologic without any other acute abdominopelvic processes noted.  No evidence of SBO.  Pelvic ultrasound shows no evidence of torsion but does show a right-sided ovarian cyst likely hemorrhagic and previously noted uterine fibroids and a small amount of fluid in the pelvis.  No other acute process or abscess noted.  Official radiology report(s): CT ABDOMEN PELVIS W CONTRAST  Result Date:  08/09/2021 CLINICAL DATA:  RLQ abdominal pain, appendicitis suspected (Age >= 14y) EXAM: CT ABDOMEN AND PELVIS WITH CONTRAST TECHNIQUE: Multidetector CT imaging of the abdomen and pelvis was performed using the standard protocol following bolus administration of intravenous contrast. CONTRAST:  13mL OMNIPAQUE IOHEXOL 300 MG/ML  SOLN COMPARISON:  02/11/2021 FINDINGS: Lower chest: Included lung bases are clear.  Heart size is normal. Hepatobiliary: Unremarkable appearance of the liver. No focal liver lesion. There are a few small layering gallstones within the gallbladder lumen. No pericholecystic inflammatory changes by CT. No biliary dilatation. Pancreas: Unremarkable. No pancreatic ductal dilatation or surrounding inflammatory changes. Spleen: Normal in size without focal abnormality. Adrenals/Urinary Tract: Adrenal glands are unremarkable. Kidneys are normal, without renal calculi, focal lesion, or hydronephrosis. Bladder is largely decompressed. Stomach/Bowel: Stomach is within normal limits. Appendix appears normal (series 2, image 56). No evidence of bowel wall thickening, distention, or inflammatory changes. Vascular/Lymphatic: No acute vascular findings are present. No enlarged abdominal or pelvic lymph nodes. Reproductive: Retroverted uterus containing numerous fibroids. 4.0 cm right adnexal cyst. Left adnexal region is unremarkable. Other: Small volume free fluid within the pelvis. No organized abdominopelvic fluid collection. No pneumoperitoneum. No abdominal wall hernia. Musculoskeletal: No acute or significant osseous findings. IMPRESSION: 1. No acute abdominopelvic findings. Normal appendix. 2. Cholelithiasis without evidence of acute cholecystitis. 3. Retroverted uterus containing numerous fibroids. 4. 4.0 cm right adnexal cyst. No follow-up imaging recommended. Note: This recommendation does not apply to premenarchal patients and to those with increased risk (genetic, family history, elevated tumor  markers or other high-risk factors) of ovarian cancer. Reference: JACR 2020 Feb; 17(2):248-254 5. Small volume free fluid within the pelvis, likely physiologic. Electronically Signed   By: Davina Poke D.O.   On: 08/09/2021 15:37   US PELVIC COMPLETE W TRANSVAGINAL AND TORSION R/O  Result Date: 08/09/2021 CLINICAL DATA:  Pelvic pain for 3 days EXAM: TRANSABDOMINAL AND TRANSVAGINAL ULTRASOUND OF PELVIS DOPPLER ULTRASOUND OF OVARIES TECHNIQUE: Both transabdominal and transvaginal ultrasound examinations of the pelvis were  performed. Transabdominal technique was performed for global imaging of the pelvis including uterus, ovaries, adnexal regions, and pelvic cul-de-sac. It was necessary to proceed with endovaginal exam following the transabdominal exam to visualize the uterus endometrium ovaries. Color and duplex Doppler ultrasound was utilized to evaluate blood flow to the ovaries. COMPARISON:  CT 08/09/2021 FINDINGS: Uterus Measurements: 9 x 6.4 x 6.9 cm = volume: 208 mL. Multiple uterine fibroids. Fundal fibroid measures 3 x 3.1 x 4 cm. Left lower uterine segment fibroid measures 16 x 18 x 15 mm. Anterior subserosal fibroid measures 2.8 x 2.1 x 1.9 cm. Endometrium Thickness: 16.1 mm.  No focal abnormality visualized. Right ovary Measurements: 4.4 x 3.3 x 3.4 cm = volume: 26 mL. Indeterminate but probably benign complicated cyst in the right ovary measuring 3.7 x 2.1 x 2.6 cm. Left ovary Measurements: 3.2 x 2 x 1.7 cm = volume: 6 mL. Normal appearance/no adnexal mass. Pulsed Doppler evaluation of both ovaries demonstrates normal low-resistance arterial and venous waveforms. Other findings Small free fluid in the pelvis IMPRESSION: 1. Negative for ovarian torsion. 2. 3.7 cm indeterminate but probably benign complex cyst in the right ovary, probably hemorrhagic cysts but recommend 6-12 week sonographic follow-up. 3. Multiple uterine fibroids 4. Small free fluid in the pelvis Electronically Signed   By: Donavan Foil M.D.   On: 08/09/2021 17:34    ____________________________________________   PROCEDURES  Procedure(s) performed (including Critical Care):  Procedures   ____________________________________________   INITIAL IMPRESSION / ASSESSMENT AND PLAN / ED COURSE      Patient presents with above-stated history exam for assessment approximately 3 days of some crampy abdominal pain more in the lower part of the abdomen associate with some nausea and constipation.  She went to get a routine checkup at health care department today and was told that pelvic exam was normal but they are worried about possible appendicitis.  On arrival patient is afebrile hemodynamically stable.  She does have some mild bilateral lower abdominal tenderness but is not peritonitis or guarding.  No significant CVA tenderness.  Differential includes appendicitis, torsion, ovarian cyst, symptomatic constipation, SBO, symptomatic fibroids, diverticulitis, cholecystitis, pancreatitis and cystitis.  CMP remarkable for K3.2 without any other significant electrolyte or metabolic derangements.  There is no evidence of hepatitis or cholestasis.  Lipase is not consistent with acute pancreatitis.  CBC shows no leukocytosis and chronic anemia with hemoglobin of 7.5 compared to 7.68 days ago.  Platelets are normal.  Urinalysis has some ketones but no evidence of infection.  Pregnancy test is negative.  CT abdomen pelvis shows no evidence of an appendicitis, diverticulitis, kidney stone, perinephric stranding, pancreatitis or cholecystitis.  There is evidence of cholelithiasis without cholecystitis.  Patient is noted to have numerous fibroids in the uterus and a right-sided adnexal cyst.  There is a small volume of free fluid within the pelvis likely physiologic without any other acute abdominopelvic processes noted.  No evidence of SBO.  Pelvic ultrasound shows no evidence of torsion but does show a right-sided ovarian cyst likely  hemorrhagic and previously noted uterine fibroids and a small amount of fluid in the pelvis.  No other acute process or abscess noted.  I suspect likely ruptured renal cyst likely theology of patient's symptoms today seen on ultrasound.  I discussed this with patient.  She is feeling significantly better on my reassessment.  Discussed supportive care but return immediately if she experiences any worsening of pain, shortness of breath, lightheadedness, dizziness, chest pain or other acute  sick symptoms.  Emphasized importance of close outpatient PCP follow-up.  Also advise she start taking some Colace and MiraLAX to help with her constipation and that she should take 1 g Tylenol every 6 hours for her abdominal pain with NSAIDs and platelets breakthrough this is could increase risk for bleeding and she is very anemic.  She will follow-up with renal clinic to establish PCP care and follow-up her ED visit today.  Discharged in stable condition.  Stroke return cautions advised and discussed.      ____________________________________________   FINAL CLINICAL IMPRESSION(S) / ED DIAGNOSES  Final diagnoses:  Pelvic pain  Generalized abdominal pain  Uterine leiomyoma, unspecified location  Calculus of gallbladder without cholecystitis without obstruction  Hypokalemia  Chronic anemia  Cyst of right ovary    Medications  morphine 4 MG/ML injection 4 mg (4 mg Intravenous Given 08/09/21 1439)  ondansetron (ZOFRAN) injection 4 mg (4 mg Intravenous Given 08/09/21 1439)  iohexol (OMNIPAQUE) 300 MG/ML solution 100 mL (100 mLs Intravenous Contrast Given 08/09/21 1521)  lactated ringers bolus 1,000 mL (1,000 mLs Intravenous New Bag/Given 08/09/21 1551)  potassium chloride SA (KLOR-CON) CR tablet 40 mEq (40 mEq Oral Given 08/09/21 1719)  acetaminophen (TYLENOL) tablet 1,000 mg (1,000 mg Oral Given 08/09/21 1719)  ketorolac (TORADOL) 30 MG/ML injection 15 mg (15 mg Intravenous Given 08/09/21 1724)     ED  Discharge Orders     None        Note:  This document was prepared using Dragon voice recognition software and may include unintentional dictation errors.    Lucrezia Starch, MD 08/09/21 [phone]

## 2021-08-09 NOTE — ED Provider Notes (Signed)
Emergency Medicine Provider Triage Evaluation Note  Carolyn [name] , a 40 y.o. female  was evaluated in triage.  Pt complains of right lower quadrant pain.  Sent by health department for right lower quadrant pain.  Patient's had decreased appetite for 3 days.  Pain with walking.  Also has fibroids.  Review of Systems  Positive: Right lower quadrant pain Negative: No fever, chills, vomiting diarrhea  Physical Exam  BP 106/72 (BP Location: Left Arm)   Pulse 74   Temp 99 F (37.2 C) (Oral)   Resp 16   Ht 5\' 5"  (1.651 m)   Wt 75 kg   LMP 07/20/2021 (Exact Date)   SpO2 100%   BMI 27.51 kg/m  Gen:   Awake, no distress   Resp:  Normal effort  MSK:   Moves extremities without difficulty  Other:  Abdomen: Right lower quadrant is very tender to palpation  Medical Decision Making  Medically screening exam initiated at 2:32 PM.  Appropriate orders placed.  Ashira D Pennock was informed that the remainder of the evaluation will be completed by another provider, this initial triage assessment does not replace that evaluation, and the importance of remaining in the ED until their evaluation is complete.  CT abdomen/pelvis ordered rule out appendicitis   Versie Starks, PA-C 08/09/21 1433    Blake Divine, MD 08/09/21 1534

## 2021-08-09 NOTE — ED Triage Notes (Signed)
Pt to ED from health dpt for lower abd pain, back pain for over a week. LWBS on 11/1 Denies n/v/d Denies urinary sx Ambulatory, NAD noted

## 2021-08-11 ENCOUNTER — Telehealth: Payer: Self-pay | Admitting: Family Medicine

## 2021-08-11 ENCOUNTER — Encounter: Payer: Self-pay | Admitting: Physician Assistant

## 2021-08-11 ENCOUNTER — Telehealth: Payer: Self-pay | Admitting: Licensed Clinical Social Worker

## 2021-08-11 NOTE — Progress Notes (Signed)
[facility] Department STI clinic/screening visit  Subjective:  Carolyn [name] is a 40 y.o. female being seen today for an STI screening visit. The patient reports they do have symptoms.  Patient reports that they do not desire a pregnancy in the next year.   They reported they are not interested in discussing contraception today.  Patient's last menstrual period was 07/20/2021 (exact date).   Patient has the following medical conditions:   Patient Active Problem List   Diagnosis Date Noted   Fibroid 02/18/2021   Acute blood loss anemia 02/12/2021   History of gastric ulcer 02/12/2021   History of GI bleed 02/12/2021   Endometrial stripe increased 02/12/2021   Vaginal bleeding 02/12/2021   Intramural leiomyoma of uterus    Menorrhagia with regular cycle 03/20/2018   Generalized anxiety disorder 11/28/2016   Major depressive disorder, single episode, moderate ([place]) 11/28/2016   Iron deficiency anemia due to chronic blood loss    Gastric ulcer without hemorrhage or perforation but with obstruction    Duodenal ulceration    GI bleed due to NSAIDs    Symptomatic anemia 02/24/2016   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   HPI  Patient reports that she has had both upper and lower abdominal pain for 3 days.  States she has also noticed some constipation and nausea off and on but denies fever, chills.  Patient reports that she was seen in May by [facility] and told that her fibroids are stable.  Denies chronic conditions and regular medicines.  Patient states that her last HIV test was 2-3 years ago and last pap was in May.  LMP was 07/20/2021 and is not using anything for [facility].   See flowsheet for further details and programmatic requirements.    The following portions of the patient's history were reviewed and updated as appropriate: allergies, current medications, past medical history, past social history, past surgical history and  problem list.  Objective:   Vitals:   08/09/21 1044  BP: 105/68  Weight: 164 lb 9.6 oz (74.7 kg)  Height: 5' 5.5" (1.664 m)    Physical Exam Constitutional:      General: She is not in acute distress.    Appearance: Normal appearance.  HENT:     Head: Normocephalic and atraumatic.     Comments: No nits,lice, or hair loss. No cervical, supraclavicular or axillary adenopathy.     Mouth/Throat:     Mouth: Mucous membranes are moist.     Pharynx: Oropharynx is clear. No oropharyngeal exudate or posterior oropharyngeal erythema.  Eyes:     Conjunctiva/sclera: Conjunctivae normal.  Pulmonary:     Effort: Pulmonary effort is normal.  Abdominal:     Palpations: Abdomen is soft. There is no mass.     Tenderness: There is abdominal tenderness. There is no guarding or rebound.     Comments: Patient with tenderness greater on R lower abdominal area than left side.  Genitourinary:    General: Normal vulva.     Rectum: Normal.  Musculoskeletal:     Cervical back: Neck supple. No tenderness.  Skin:    General: Skin is warm and dry.     Findings: No bruising, erythema, lesion or rash.  Neurological:     Mental Status: She is alert and oriented to person, place, and time.  Psychiatric:        Mood and Affect: Mood normal.  Behavior: Behavior normal.        Thought Content: Thought content normal.        Judgment: Judgment normal.     Assessment and Plan:  Carolyn [name] is a 40 y.o. female presenting to the [facility] Department for STI screening  1. Screening for STD (sexually transmitted disease) Patient into clinic with symptoms. Patient changed mind and declines blood work today. Due to discomfort with abdominal and pelvic exam, stressed to patient importance of going to the ER for more appropriate evaluation for ovarian cyst/s, appendicitis, gall stones, kidney stones or other causes.  Patient agrees to go to ER for evaluation today.  Rec condoms with  all sex. Await test results.  Counseled that RN will call if needs to RTC for treatment once results are back.  - WET PREP FOR [place], YEAST, [facility]  2. Generalized anxiety disorder Will refer to LCSW per patient request.     No follow-ups on file.  No future appointments.  Jerene Dilling, PA

## 2021-08-11 NOTE — Telephone Encounter (Signed)
Patient called to ask about mental health services.

## 2021-10-26 ENCOUNTER — Ambulatory Visit: Payer: Medicaid Other

## 2021-10-26 ENCOUNTER — Other Ambulatory Visit: Payer: Self-pay

## 2021-11-08 ENCOUNTER — Ambulatory Visit: Payer: Medicaid Other

## 2021-11-09 ENCOUNTER — Ambulatory Visit: Payer: Medicaid Other

## 2021-11-17 ENCOUNTER — Ambulatory Visit: Payer: Medicaid Other

## 2021-12-06 ENCOUNTER — Ambulatory Visit: Payer: Medicaid Other

## 2022-01-02 ENCOUNTER — Emergency Department
Admission: EM | Admit: 2022-01-02 | Discharge: 2022-01-02 | Disposition: A | Discharge status: Home / Self Care | Payer: 59 | Attending: Emergency Medicine | Admitting: Emergency Medicine

## 2022-01-02 ENCOUNTER — Emergency Department: Payer: 59

## 2022-01-02 ENCOUNTER — Other Ambulatory Visit: Payer: Self-pay

## 2022-01-02 DIAGNOSIS — N390 Urinary tract infection, site not specified: Secondary | ICD-10-CM | POA: Diagnosis not present

## 2022-01-02 DIAGNOSIS — D649 Anemia, unspecified: Secondary | ICD-10-CM | POA: Insufficient documentation

## 2022-01-02 DIAGNOSIS — K429 Umbilical hernia without obstruction or gangrene: Secondary | ICD-10-CM | POA: Diagnosis not present

## 2022-01-02 DIAGNOSIS — K449 Diaphragmatic hernia without obstruction or gangrene: Secondary | ICD-10-CM | POA: Diagnosis not present

## 2022-01-02 DIAGNOSIS — R109 Unspecified abdominal pain: Secondary | ICD-10-CM

## 2022-01-02 DIAGNOSIS — R9431 Abnormal electrocardiogram [ECG] [EKG]: Secondary | ICD-10-CM | POA: Diagnosis not present

## 2022-01-02 DIAGNOSIS — R319 Hematuria, unspecified: Secondary | ICD-10-CM | POA: Diagnosis not present

## 2022-01-02 DIAGNOSIS — R1032 Left lower quadrant pain: Secondary | ICD-10-CM | POA: Diagnosis not present

## 2022-01-02 DIAGNOSIS — K8020 Calculus of gallbladder without cholecystitis without obstruction: Secondary | ICD-10-CM | POA: Diagnosis not present

## 2022-01-02 LAB — BASIC METABOLIC PANEL
Anion gap: 6 (ref 5–15)
BUN: 10 mg/dL (ref 6–20)
CO2: 24 mmol/L (ref 22–32)
Calcium: 9.1 mg/dL (ref 8.9–10.3)
Chloride: 107 mmol/L (ref 98–111)
Creatinine, Ser: 0.68 mg/dL (ref 0.44–1.00)
GFR, Estimated: >60 mL/min (ref >60)
Glucose, Bld: 127 mg/dL — ABNORMAL HIGH (ref 70–99)
Potassium: 3.7 mmol/L (ref 3.5–5.1)
Sodium: 137 mmol/L (ref 135–145)

## 2022-01-02 LAB — CBC
HCT: 24.8 % — ABNORMAL LOW (ref 36.0–46.0)
Hemoglobin: 6.4 g/dL — ABNORMAL LOW (ref 12.0–15.0)
MCH: 16 pg — ABNORMAL LOW (ref 26.0–34.0)
MCHC: 25.8 g/dL — ABNORMAL LOW (ref 30.0–36.0)
MCV: 61.8 fL — ABNORMAL LOW (ref 80.0–100.0)
Platelets: 263 10*3/uL (ref 150–400)
RBC: 4.01 MIL/uL (ref 3.87–5.11)
RDW: 22.5 % — ABNORMAL HIGH (ref 11.5–15.5)
WBC: 8.4 10*3/uL (ref 4.0–10.5)
nRBC: 0 % (ref 0.0–0.2)

## 2022-01-02 LAB — URINALYSIS, ROUTINE W REFLEX MICROSCOPIC
Bilirubin Urine: NEGATIVE
Glucose, UA: NEGATIVE mg/dL
Hgb urine dipstick: NEGATIVE
Ketones, ur: NEGATIVE mg/dL
Nitrite: NEGATIVE
Protein, ur: NEGATIVE mg/dL
Specific Gravity, Urine: 1.025 (ref 1.005–1.030)
pH: 5 (ref 5.0–8.0)

## 2022-01-02 LAB — LIPASE, BLOOD: Lipase: 48 U/L (ref 11–51)

## 2022-01-02 LAB — PREGNANCY, URINE: Preg Test, Ur: NEGATIVE

## 2022-01-02 LAB — PREPARE RBC (CROSSMATCH)

## 2022-01-02 MED ORDER — KETOROLAC TROMETHAMINE 30 MG/ML IJ SOLN
30.0000 mg | Freq: Once | INTRAMUSCULAR | Status: AC
  Administered 2022-01-02: 30 mg via INTRAVENOUS
  Filled 2022-01-02: qty 1

## 2022-01-02 MED ORDER — HYDROCODONE-ACETAMINOPHEN 5-325 MG PO TABS: 1.0000 | ORAL_TABLET | ORAL | 0 refills | Status: DC | PRN

## 2022-01-02 MED ORDER — SODIUM CHLORIDE 0.9 % IV BOLUS
1000.0000 mL | Freq: Once | INTRAVENOUS | Status: AC
  Administered 2022-01-02: 1000 mL via INTRAVENOUS

## 2022-01-02 MED ORDER — CEFDINIR 300 MG PO CAPS: 300.0000 mg | ORAL_CAPSULE | Freq: Two times a day (BID) | ORAL | 0 refills | Status: AC

## 2022-01-02 MED ORDER — HYDROCODONE-ACETAMINOPHEN 5-325 MG PO TABS
1.0000 | ORAL_TABLET | Freq: Once | ORAL | Status: AC
  Administered 2022-01-02: 1 via ORAL
  Filled 2022-01-02: qty 1

## 2022-01-02 MED ORDER — SODIUM CHLORIDE 0.9 % IV SOLN
10.0000 mL/h | Freq: Once | INTRAVENOUS | Status: AC
Start: 2022-01-02 — End: 2022-01-02
  Administered 2022-01-02: 10 mL/h via INTRAVENOUS

## 2022-01-02 NOTE — ED Provider Notes (Signed)
? ?[facility] ?Provider Note ? ? Event Date/Time  ? First MD Initiated Contact with Patient 01/02/22 1452   ?  (approximate) ?History  ?Flank Pain and Abdominal Pain ? ?HPI ?Carolyn [name] is a 41 y.o. female with a past medical history of cholelithiasis, fibroids, and recurrent GI bleeding from a gastric ulcer who presents for left flank pain radiating into the left lower quadrant and down into the suprapubic region with associated nausea, hematuria, and diarrhea.  Patient describes 10/10 radiating pain that has not improved with Tylenol.  Patient denies any hematemesis, hematochezia, vomiting, chest pain, shortness of breath, or abnormal vaginal discharge ?Physical Exam  ?Triage Vital Signs: ?ED Triage Vitals  ?Enc Vitals Group  ?   BP 01/02/22 1449 129/87  ?   Pulse Rate 01/02/22 1449 95  ?   Resp 01/02/22 1449 20  ?   Temp 01/02/22 1451 98.1 ?F (36.7 ?C)  ?   Temp Source 01/02/22 1451 Oral  ?   SpO2 01/02/22 1449 100 %  ?   Weight 01/02/22 1450 180 lb (81.6 kg)  ?   Height 01/02/22 1450 '5\' 6"'$  (1.676 m)  ?   Head Circumference --   ?   Peak Flow --   ?   Pain Score 01/02/22 1450 10  ?   Pain Loc --   ?   Pain Edu? --   ?   Excl. in [place]? --   ? ?Most recent vital signs: ?Vitals:  ? 01/02/22 2000 01/02/22 2030  ?BP: 115/77 120/90  ?Pulse: 87 76  ?Resp:    ?Temp:    ?SpO2: 100% 100%  ? ?General: Awake, oriented x4. ?CV:  Good peripheral perfusion.  ?Resp:  Normal effort.  ?Abd:  No distention.  ?Other:  Middle-aged African-American female laying in bed in no distress ?ED Results / Procedures / Treatments  ?Labs ?(all labs ordered are listed, but only abnormal results are displayed) ?Labs Reviewed  ?URINALYSIS, ROUTINE W REFLEX MICROSCOPIC - Abnormal; Notable for the following components:  ?    Result Value  ? Color, Urine YELLOW (*)   ? APPearance CLOUDY (*)   ? Leukocytes,Ua MODERATE (*)   ? Bacteria, UA RARE (*)   ? All other components within normal limits  ?BASIC METABOLIC PANEL -  Abnormal; Notable for the following components:  ? Glucose, Bld 127 (*)   ? All other components within normal limits  ?CBC - Abnormal; Notable for the following components:  ? Hemoglobin 6.4 (*)   ? HCT 24.8 (*)   ? MCV 61.8 (*)   ? MCH 16.0 (*)   ? MCHC 25.8 (*)   ? RDW 22.5 (*)   ? All other components within normal limits  ?LIPASE, BLOOD  ?PREGNANCY, URINE  ?PREPARE RBC (CROSSMATCH)  ?TYPE AND SCREEN  ? ?RADIOLOGY ?ED MD interpretation: CT renal stone study as interpreted by me shows no evidence of acute abnormalities. ?-Agree with radiology assessment ?Official radiology report(s): ?No results found. ?PROCEDURES: ?Critical Care performed: No ?.1-3 Lead EKG Interpretation ?Performed by: Naaman Plummer, MD ?Authorized by: Naaman Plummer, MD  ? ?  Interpretation: normal   ?  ECG rate:  74 ?  ECG rate assessment: normal   ?  Rhythm: sinus rhythm   ?  Ectopy: none   ?  Conduction: normal   ?MEDICATIONS ORDERED IN ED: ?Medications  ?ketorolac (TORADOL) 30 MG/ML injection 30 mg (30 mg Intravenous Given 01/02/22 1532)  ?sodium chloride 0.9 %  bolus 1,000 mL (0 mLs Intravenous Stopped 01/02/22 2038)  ?0.9 %  sodium chloride infusion (0 mL/hr Intravenous Stopped 01/02/22 2038)  ?HYDROcodone-acetaminophen (NORCO/VICODIN) 5-325 MG per tablet 1 tablet (1 tablet Oral Given 01/02/22 1806)  ? ?IMPRESSION / MDM / ASSESSMENT AND PLAN / ED COURSE  ?I reviewed the triage vital signs and the nursing notes. ?             ?               ?The patient is on the cardiac monitor to evaluate for evidence of arrhythmia and/or significant heart rate changes. ?Not Pregnant. Unlikely TOA, Ovarian Torsion, PID, gonorrhea/chlamydia. ?Low suspicion for Infected Urolithiasis, AAA, Cholecystitis, Pancreatitis, SBO, Appendicitis, or other acute abdomen. ? ?Rx: Cefdinir 300 mg BID for 5 days ?Disposition: Discharge home. SRP discussed. Advise follow up with primary care provider within 24-72 hours. ? ?  ?FINAL CLINICAL IMPRESSION(S) / ED DIAGNOSES   ? ?Final diagnoses:  ?Urinary tract infection without hematuria, site unspecified  ?Left flank pain  ?Anemia, unspecified type  ? ?Rx / DC Orders  ? ?ED Discharge Orders   ? ?      Ordered  ?  cefdinir (OMNICEF) 300 MG capsule  2 times daily       ? 01/02/22 1812  ?  HYDROcodone-acetaminophen (NORCO) 5-325 MG tablet  Every 4 hours PRN       ? 01/02/22 1812  ? ?  ?  ? ?  ? ?Note:  This document was prepared using Dragon voice recognition software and may include unintentional dictation errors. ?  ?Naaman Plummer, MD ?01/04/22 2005 ? ?

## 2022-01-02 NOTE — ED Triage Notes (Signed)
Pt with c/o left flank pain radiating to her LLQA x 4 days. Pt states seeing blood in urine but denies pain with urination. Pt denies N/V, state she has had some diarrhea.  ?

## 2022-01-02 NOTE — ED Notes (Signed)
Lab called and will run urine pregnancy test. CT scan tech aware. ?

## 2022-01-03 LAB — BPAM RBC
Blood Product Expiration Date: 202305052359
ISSUE DATE / TIME: 202304021740
Unit Type and Rh: 6200

## 2022-01-03 LAB — TYPE AND SCREEN
ABO/RH(D): A POS
Antibody Screen: NEGATIVE
Unit division: 0

## 2022-01-19 ENCOUNTER — Emergency Department
Admission: EM | Admit: 2022-01-19 | Discharge: 2022-01-19 | Disposition: A | Discharge status: Home / Self Care | Payer: 59 | Attending: Emergency Medicine | Admitting: Emergency Medicine

## 2022-01-19 ENCOUNTER — Encounter: Payer: Self-pay | Admitting: Emergency Medicine

## 2022-01-19 ENCOUNTER — Emergency Department: Payer: 59

## 2022-01-19 ENCOUNTER — Other Ambulatory Visit: Payer: Self-pay

## 2022-01-19 DIAGNOSIS — R5382 Chronic fatigue, unspecified: Secondary | ICD-10-CM | POA: Insufficient documentation

## 2022-01-19 DIAGNOSIS — D649 Anemia, unspecified: Secondary | ICD-10-CM | POA: Diagnosis not present

## 2022-01-19 DIAGNOSIS — K8020 Calculus of gallbladder without cholecystitis without obstruction: Secondary | ICD-10-CM | POA: Diagnosis not present

## 2022-01-19 DIAGNOSIS — K5901 Slow transit constipation: Secondary | ICD-10-CM | POA: Diagnosis not present

## 2022-01-19 DIAGNOSIS — R1032 Left lower quadrant pain: Secondary | ICD-10-CM | POA: Diagnosis not present

## 2022-01-19 DIAGNOSIS — K429 Umbilical hernia without obstruction or gangrene: Secondary | ICD-10-CM | POA: Diagnosis not present

## 2022-01-19 LAB — URINALYSIS, ROUTINE W REFLEX MICROSCOPIC
Bilirubin Urine: NEGATIVE
Glucose, UA: NEGATIVE mg/dL
Hgb urine dipstick: NEGATIVE
Ketones, ur: NEGATIVE mg/dL
Leukocytes,Ua: NEGATIVE
Nitrite: NEGATIVE
Protein, ur: NEGATIVE mg/dL
Specific Gravity, Urine: 1.024 (ref 1.005–1.030)
pH: 5 (ref 5.0–8.0)

## 2022-01-19 LAB — CBC
HCT: 28.6 % — ABNORMAL LOW (ref 36.0–46.0)
Hemoglobin: 7.8 g/dL — ABNORMAL LOW (ref 12.0–15.0)
MCH: 18.4 pg — ABNORMAL LOW (ref 26.0–34.0)
MCHC: 27.3 g/dL — ABNORMAL LOW (ref 30.0–36.0)
MCV: 67.3 fL — ABNORMAL LOW (ref 80.0–100.0)
Platelets: 333 10*3/uL (ref 150–400)
RBC: 4.25 MIL/uL (ref 3.87–5.11)
RDW: 27.3 % — ABNORMAL HIGH (ref 11.5–15.5)
WBC: 10 10*3/uL (ref 4.0–10.5)
nRBC: 0 % (ref 0.0–0.2)

## 2022-01-19 LAB — BASIC METABOLIC PANEL
Anion gap: 6 (ref 5–15)
BUN: 10 mg/dL (ref 6–20)
CO2: 26 mmol/L (ref 22–32)
Calcium: 8.7 mg/dL — ABNORMAL LOW (ref 8.9–10.3)
Chloride: 106 mmol/L (ref 98–111)
Creatinine, Ser: 0.68 mg/dL (ref 0.44–1.00)
GFR, Estimated: >60 mL/min (ref >60)
Glucose, Bld: 89 mg/dL (ref 70–99)
Potassium: 3.6 mmol/L (ref 3.5–5.1)
Sodium: 138 mmol/L (ref 135–145)

## 2022-01-19 LAB — PREGNANCY, URINE: Preg Test, Ur: NEGATIVE

## 2022-01-19 MED ORDER — IRON 28 MG PO TABS
1.0000 | ORAL_TABLET | Freq: Every day | ORAL | 2 refills | Status: DC
Start: 2022-01-19 — End: 2022-08-23

## 2022-01-19 MED ORDER — DOCUSATE SODIUM 100 MG PO CAPS: 100.0000 mg | ORAL_CAPSULE | Freq: Two times a day (BID) | ORAL | 2 refills | Status: DC

## 2022-01-19 MED ORDER — POLYETHYLENE GLYCOL 3350 17 G PO PACK: 17.0000 g | PACK | Freq: Every day | ORAL | 3 refills | Status: DC

## 2022-01-19 NOTE — ED Triage Notes (Signed)
Pt comes into the ED via POV c/o left flank pain that has been ongoing x 2 days.  Pt denies any urinary problems, but states she has been seen for kidney problems in the past.  Pt denies any h/o kidney stones.  Pt in NAD at this time with even and unlabored respirations.  ?

## 2022-01-19 NOTE — ED Provider Notes (Signed)
? ?[facility] ?Provider Note ? ?Patient Contact: 4:31 PM (approximate) ? ? ?History  ? ?Flank Pain ? ? ?HPI ? ?Carolyn [name] is a 41 y.o. female who presents the emergency department complaining of left-sided flank pain radiating into the left abdomen.  Patient states that she has had severe cramping type pain that has been radiating from her left flank through her left abdomen.  Denies any dysuria, polyuria or hematuria.  She does struggle from chronic constipation but has not noticed any significant changes in her constipation.  Patient has had no diarrhea.  No fevers or chills, nausea or vomiting.  Patient states that she does have anemia, is chronically fatigued.  No significant changes in her level of fatigue either. ?  ? ? ?Physical Exam  ? ?Triage Vital Signs: ?ED Triage Vitals  ?Enc Vitals Group  ?   BP 01/19/22 1407 125/78  ?   Pulse Rate 01/19/22 1407 79  ?   Resp 01/19/22 1407 17  ?   Temp 01/19/22 1407 98.3 ?F (36.8 ?C)  ?   Temp Source 01/19/22 1407 Oral  ?   SpO2 01/19/22 1407 100 %  ?   Weight 01/19/22 1404 180 lb 0.8 oz (81.7 kg)  ?   Height 01/19/22 1404 '5\' 6"'$  (1.676 m)  ?   Head Circumference --   ?   Peak Flow --   ?   Pain Score --   ?   Pain Loc --   ?   Pain Edu? --   ?   Excl. in [place]? --   ? ? ?Most recent vital signs: ?Vitals:  ? 01/19/22 1407 01/19/22 1630  ?BP: 125/78 111/72  ?Pulse: 79 80  ?Resp: 17 17  ?Temp: 98.3 ?F (36.8 ?C)   ?SpO2: 100% 100%  ? ? ? ?General: Alert and in no acute distress.   ?Cardiovascular:  Good peripheral perfusion ?Respiratory: Normal respiratory effort without tachypnea or retractions. Lungs CTAB.  ?Gastrointestinal: Bowel sounds ?4 quadrants.  Soft to palpation all quadrants.  Slightly tender in the left lower quadrant.  No other tenderness to palpation.. No guarding or rigidity. No palpable masses. No distention.  Mild left-sided CVA tenderness ?Musculoskeletal: Full range of motion to all extremities.  ?Neurologic:  No gross focal  neurologic deficits are appreciated.  ?Skin:   No rash noted ?Other: ? ? ?ED Results / Procedures / Treatments  ? ?Labs ?(all labs ordered are listed, but only abnormal results are displayed) ?Labs Reviewed  ?URINALYSIS, ROUTINE W REFLEX MICROSCOPIC - Abnormal; Notable for the following components:  ?    Result Value  ? Color, Urine YELLOW (*)   ? APPearance CLOUDY (*)   ? All other components within normal limits  ?BASIC METABOLIC PANEL - Abnormal; Notable for the following components:  ? Calcium 8.7 (*)   ? All other components within normal limits  ?CBC - Abnormal; Notable for the following components:  ? Hemoglobin 7.8 (*)   ? HCT 28.6 (*)   ? MCV 67.3 (*)   ? MCH 18.4 (*)   ? MCHC 27.3 (*)   ? RDW 27.3 (*)   ? All other components within normal limits  ?PREGNANCY, URINE  ?POC URINE PREG, ED  ? ? ? ?EKG ? ? ? ? ?RADIOLOGY ? ?I personally viewed and evaluated these images as part of my medical decision making, as well as reviewing the written report by the radiologist. ? ?ED Provider Interpretation: No acute findings on  CT abdomen and pelvis to explain patient's symptoms. ? ?CT Renal Stone Study ? ?Result Date: 01/19/2022 ?CLINICAL DATA:  Left flank pain EXAM: CT ABDOMEN AND PELVIS WITHOUT CONTRAST TECHNIQUE: Multidetector CT imaging of the abdomen and pelvis was performed following the standard protocol without IV contrast. RADIATION DOSE REDUCTION: This exam was performed according to the departmental dose-optimization program which includes automated exposure control, adjustment of the mA and/or kV according to patient size and/or use of iterative reconstruction technique. COMPARISON:  01/02/2022 FINDINGS: Lower chest: No focal pulmonary opacity. No pleural effusion. No pericardial effusion. Hepatobiliary: No focal liver abnormality is seen. Cholelithiasis. No pericholecystic fluid, gallbladder wall thickening, or biliary dilatation. Pancreas: Unremarkable. No pancreatic ductal dilatation or surrounding  inflammatory changes. Spleen: Normal in size without focal abnormality. Adrenals/Urinary Tract: The adrenal glands are unremarkable. The kidneys are symmetric in size. No nephrolithiasis. The visualized course of the ureters is unremarkable. The bladder is normal for degree of distension. No stone is seen within the bladder or visualized ureters. Stomach/Bowel: Stomach is within normal limits. Appendix appears normal. No evidence of bowel wall thickening, distention, or inflammatory changes. Vascular/Lymphatic: No significant vascular findings are present. No enlarged abdominal or pelvic lymph nodes. Reproductive: Uterus and bilateral adnexa are unremarkable. Other: Small fat containing umbilical hernia. No free fluid or free air. Musculoskeletal: Dextrocurvature of the thoracolumbar junction. No acute osseous abnormality. IMPRESSION: 1. No renal or ureteral stones. No etiology is seen for the patient's flank pain. 2. Cholelithiasis without evidence of cholecystitis. Electronically Signed   By: Merilyn Baba M.D.   On: 01/19/2022 18:50   ? ?PROCEDURES: ? ?Critical Care performed: No ? ?Procedures ? ? ?MEDICATIONS ORDERED IN ED: ?Medications - No data to display ? ? ?IMPRESSION / MDM / ASSESSMENT AND PLAN / ED COURSE  ?I reviewed the triage vital signs and the nursing notes. ?             ?               ? ?Differential diagnosis includes, but is not limited to, diverticulitis, colitis, nephrolithiasis, UTI, pyelonephritis, anemia ? ? ?Patient's diagnosis is consistent with mild chronic constipation, for quadrant abdominal pain and anemia.  Patient presents the emergency department with chronic fatigue.  Based off of previous lab works patient is always anemic.  She has a hemoglobin of 7.8 today which is actually higher than typical for the patient.  She does not follow with anybody nor does she take iron supplements for her anemia.  Given the fact that she is always anemic, typically more anemic than today with no  worsening symptoms do not feel that patient requires aggressive management with transfusion.  She will follow-up with hematology oncology for management of chronic anemia.  Patient is complaining of left lower quadrant pain today.  Labs are reassuring.  CT scan revealed no acute findings.  Patient is chronically constipated and would like medications for same.  I will prescribe MiraLAX and Colace for the patient.  Follow-up primary care as needed.  Return precautions discussed with the patient..  Patient is given ED precautions to return to the ED for any worsening or new symptoms. ? ? ? ?  ? ? ?FINAL CLINICAL IMPRESSION(S) / ED DIAGNOSES  ? ?Final diagnoses:  ?LLQ pain  ?Slow transit constipation  ?Anemia, unspecified type  ? ? ? ?Rx / DC Orders  ? ?ED Discharge Orders   ? ?      Ordered  ?  docusate sodium (COLACE)  100 MG capsule  2 times daily       ? 01/19/22 1946  ?  polyethylene glycol (MIRALAX) 17 g packet  Daily       ? 01/19/22 1946  ?  Ferrous Sulfate (IRON) 28 MG TABS  Daily       ? 01/19/22 1946  ? ?  ?  ? ?  ? ? ? ?Note:  This document was prepared using Dragon voice recognition software and may include unintentional dictation errors. ?  ?Darletta Moll, PA-C ?01/19/22 1948 ? ?  ?Blake Divine, MD ?01/19/22 2337 ? ?

## 2022-02-04 ENCOUNTER — Inpatient Hospital Stay: Payer: 59

## 2022-02-04 ENCOUNTER — Encounter: Payer: Self-pay | Admitting: Oncology

## 2022-02-04 ENCOUNTER — Inpatient Hospital Stay: Payer: 59 | Attending: Oncology | Admitting: Oncology

## 2022-02-04 DIAGNOSIS — Z801 Family history of malignant neoplasm of trachea, bronchus and lung: Secondary | ICD-10-CM | POA: Insufficient documentation

## 2022-02-04 DIAGNOSIS — R69 Illness, unspecified: Secondary | ICD-10-CM | POA: Diagnosis not present

## 2022-02-04 DIAGNOSIS — Z7951 Long term (current) use of inhaled steroids: Secondary | ICD-10-CM | POA: Insufficient documentation

## 2022-02-04 DIAGNOSIS — F17210 Nicotine dependence, cigarettes, uncomplicated: Secondary | ICD-10-CM | POA: Insufficient documentation

## 2022-02-04 DIAGNOSIS — Z800 Family history of malignant neoplasm of digestive organs: Secondary | ICD-10-CM | POA: Diagnosis not present

## 2022-02-04 DIAGNOSIS — Z793 Long term (current) use of hormonal contraceptives: Secondary | ICD-10-CM | POA: Insufficient documentation

## 2022-02-04 DIAGNOSIS — D509 Iron deficiency anemia, unspecified: Secondary | ICD-10-CM

## 2022-02-04 DIAGNOSIS — D508 Other iron deficiency anemias: Secondary | ICD-10-CM | POA: Insufficient documentation

## 2022-02-04 DIAGNOSIS — Z806 Family history of leukemia: Secondary | ICD-10-CM | POA: Diagnosis not present

## 2022-02-04 LAB — CBC WITH DIFFERENTIAL/PLATELET
Abs Immature Granulocytes: 0.03 10*3/uL (ref 0.00–0.07)
Basophils Absolute: 0.1 10*3/uL (ref 0.0–0.1)
Basophils Relative: 1 %
Eosinophils Absolute: 0.1 10*3/uL (ref 0.0–0.5)
Eosinophils Relative: 1 %
HCT: 28.9 % — ABNORMAL LOW (ref 36.0–46.0)
Hemoglobin: 8.2 g/dL — ABNORMAL LOW (ref 12.0–15.0)
Immature Granulocytes: 0 %
Lymphocytes Relative: 25 %
Lymphs Abs: 2.1 10*3/uL (ref 0.7–4.0)
MCH: 19.9 pg — ABNORMAL LOW (ref 26.0–34.0)
MCHC: 28.4 g/dL — ABNORMAL LOW (ref 30.0–36.0)
MCV: 70.1 fL — ABNORMAL LOW (ref 80.0–100.0)
Monocytes Absolute: 0.4 10*3/uL (ref 0.1–1.0)
Monocytes Relative: 5 %
Neutro Abs: 5.6 10*3/uL (ref 1.7–7.7)
Neutrophils Relative %: 68 %
Platelets: 318 10*3/uL (ref 150–400)
RBC: 4.12 MIL/uL (ref 3.87–5.11)
RDW: 27.9 % — ABNORMAL HIGH (ref 11.5–15.5)
WBC: 8.3 10*3/uL (ref 4.0–10.5)
nRBC: 0 % (ref 0.0–0.2)

## 2022-02-04 LAB — FERRITIN: Ferritin: 5 ng/mL — ABNORMAL LOW (ref 11–307)

## 2022-02-04 LAB — IRON AND TIBC
Iron: 91 ug/dL (ref 28–170)
Saturation Ratios: 18 % (ref 10.4–31.8)
TIBC: 519 ug/dL — ABNORMAL HIGH (ref 250–450)
UIBC: 428 ug/dL

## 2022-02-04 LAB — FOLATE: Folate: 17.1 ng/mL (ref >5.9)

## 2022-02-04 LAB — VITAMIN B12: Vitamin B-12: 543 pg/mL (ref 180–914)

## 2022-02-04 LAB — TSH: TSH: 1.283 u[IU]/mL (ref 0.350–4.500)

## 2022-02-04 NOTE — Progress Notes (Signed)
Pt is tired all the time, she has fibroids and is looking into getting surgery for this. She has been  taking iron pills for years and never helps the fatigue to get to feeling better ?

## 2022-02-04 NOTE — Progress Notes (Signed)
? ?Hematology/Oncology Consult note ?[facility] ?[phone] [phone] ? ?Patient Care Team: ?Pcp, No as PCP - General  ? ?Name of the patient: Carolyn [name]  ?[id number]  ?1981-06-19  ? ? ?Reason for referral-anemia ?  ?Referring physician- Dr. Blake Divine ? ?Date of visit: 02/04/22 ? ? ?History of presenting illness-patient is a 40 year old African-American femaleWho was seen in the ER for symptoms of cramping abdominal pain.  She also reported chronic fatigue and had her blood work checked which showed an H&H of 7.8/28.6 with an MCV of 67 and a platelet count of 333.  White count normal at 10.  Looking back at her prior CBCs her hemoglobin has been chronically low between 6-7 dating back to 2017 with chronic microcytosis.  No recent iron studies checked.  Her iron levels back in May 2022 showed evidence of iron deficiency. ? ?Family history of colon cancer in her father.  She has had a prior upper endoscopy by Dr. Sondra Barges showed a hiatal hernia and nonbleeding gastric ulcers.  She has not had any colonoscopy so far.  Patient reports that her menstrual cycles are heavy in the last for about 7 days and the first 3 to 4 days are particularly heavy.  She did undergo a pelvic ultrasound in November 2022 as well which showed multiple uterine hemorrhoids and probably benign but a complex cyst in the right ovary.  She denies any consistent use of NSAIDs Goody powder or BC powder.  Presently denies any blood in her stool or urine.  She reports ongoing fatigue ? ?ECOG PS- 1 ? ?Pain scale- 0 ? ? ?Review of systems- Review of Systems  ?Constitutional:  Positive for malaise/fatigue. Negative for chills, fever and weight loss.  ?HENT:  Negative for congestion, ear discharge and nosebleeds.   ?Eyes:  Negative for blurred vision.  ?Respiratory:  Negative for cough, hemoptysis, sputum production, shortness of breath and wheezing.   ?Cardiovascular:  Negative for chest pain,  palpitations, orthopnea and claudication.  ?Gastrointestinal:  Negative for abdominal pain, blood in stool, constipation, diarrhea, heartburn, melena, nausea and vomiting.  ?Genitourinary:  Negative for dysuria, flank pain, frequency, hematuria and urgency.  ?Musculoskeletal:  Negative for back pain, joint pain and myalgias.  ?Skin:  Negative for rash.  ?Neurological:  Negative for dizziness, tingling, focal weakness, seizures, weakness and headaches.  ?Endo/Heme/Allergies:  Does not bruise/bleed easily.  ?Psychiatric/Behavioral:  Negative for depression and suicidal ideas. The patient does not have insomnia.   ? ?No Known Allergies ? ?Patient Active Problem List  ? Diagnosis Date Noted  ? Fibroid 02/18/2021  ? Acute blood loss anemia 02/12/2021  ? History of gastric ulcer 02/12/2021  ? History of GI bleed 02/12/2021  ? Endometrial stripe increased 02/12/2021  ? Vaginal bleeding 02/12/2021  ? Intramural leiomyoma of uterus   ? Menorrhagia with regular cycle 03/20/2018  ? Generalized anxiety disorder 11/28/2016  ? 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 Iron deficiency anemia due to chronic blood loss   ? Gastric ulcer without hemorrhage or perforation but with obstruction   ? Duodenal ulceration   ? GI bleed due to NSAIDs   ? Symptomatic anemia 02/24/2016  ? 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 Overweight 07/14/2015  ? ? ? ?Past Medical History:  ?Diagnosis Date  ? Anemia   ? GERD (gastroesophageal reflux disease)   ? Sickle cell trait ([place])   ? ? ? ?Past Surgical History:  ?Procedure Laterality Date  ? ESOPHAGOGASTRODUODENOSCOPY (EGD) WITH  PROPOFOL N/A 02/26/2016  ? Procedure: ESOPHAGOGASTRODUODENOSCOPY (EGD) WITH PROPOFOL;  Surgeon: Lucilla Lame, MD;  Location: [facility] ENDOSCOPY;  Service: Endoscopy;  Laterality: N/A;  ? ? ?Social History  ? ?Socioeconomic History  ? Marital status: Married  ?  Spouse name: Not on file  ? Number of children: Not on file  ? Years of education: Not on file  ? Highest  education level: Not on file  ?Occupational History  ? Not on file  ?Tobacco Use  ? Smoking status: Every Day  ?  Packs/day: 0.25  ?  Types: Cigarettes  ? Smokeless tobacco: Never  ?Vaping Use  ? Vaping Use: Never used  ?Substance and Sexual Activity  ? Alcohol use: Yes  ?  Comment: glass once a week  ? Drug use: Not Currently  ?  Types: Marijuana  ? Sexual activity: Yes  ?  Partners: Male  ?  Comment: no partner currently  ?Other Topics Concern  ? Not on file  ?Social History Narrative  ? Not on file  ? ?Social Determinants of Health  ? ?Financial Resource Strain: Not on file  ?Food Insecurity: Not on file  ?Transportation Needs: Not on file  ?Physical Activity: Not on file  ?Stress: Not on file  ?Social Connections: Not on file  ?Intimate Partner Violence: Not on file  ? ?  ?Family History  ?Problem Relation Age of Onset  ? Diabetes Mother   ? Hypertension Mother   ? Lung cancer Father   ? Leukemia Father   ? Lung cancer Maternal Grandmother   ? Colon cancer Maternal Grandfather   ? ? ? ?Current Outpatient Medications:  ?  docusate sodium (COLACE) 100 MG capsule, Take 1 capsule (100 mg total) by mouth 2 (two) times daily., Disp: 60 capsule, Rfl: 2 ?  Ferrous Sulfate (IRON) 28 MG TABS, Take 1 tablet (28 mg total) by mouth daily., Disp: 30 tablet, Rfl: 2 ?  HYDROcodone-acetaminophen (NORCO) 5-325 MG tablet, Take 1 tablet by mouth every 4 (four) hours as needed for moderate pain., Disp: 6 tablet, Rfl: 0 ?  loratadine (CLARITIN) 10 MG tablet, Take 1 tablet (10 mg total) by mouth daily., Disp: 30 tablet, Rfl: 2 ?  medroxyPROGESTERone (PROVERA) 10 MG tablet, Take 2 tablets (20 mg total) by mouth once daily., Disp: 40 tablet, Rfl: 0 ?  mometasone (NASONEX) 50 MCG/ACT nasal spray, Place 2 sprays into the nose daily., Disp: 1 each, Rfl: 2 ?  polyethylene glycol (MIRALAX) 17 g packet, Take 17 g by mouth daily., Disp: 30 each, Rfl: 3 ? ? ?Physical exam:  ?Physical Exam ?Constitutional:   ?   General: She is not in acute  distress. ?Cardiovascular:  ?   Rate and Rhythm: Normal rate and regular rhythm.  ?   Heart sounds: Normal heart sounds.  ?Pulmonary:  ?   Effort: Pulmonary effort is normal.  ?   Breath sounds: Normal breath sounds.  ?Abdominal:  ?   General: Bowel sounds are normal.  ?   Palpations: Abdomen is soft.  ?Skin: ?   General: Skin is warm and dry.  ?Neurological:  ?   Mental Status: She is alert and oriented to person, place, and time.  ?  ? ? ? ? ?  Latest Ref Rng & Units 01/19/2022  ?  2:06 PM  ?CMP  ?Glucose 70 - 99 mg/dL 89    ?BUN 6 - 20 mg/dL 10    ?Creatinine 0.44 - 1.00 mg/dL 0.68    ?Sodium 135 -  145 mmol/L 138    ?Potassium 3.5 - 5.1 mmol/L 3.6    ?Chloride 98 - 111 mmol/L 106    ?CO2 22 - 32 mmol/L 26    ?Calcium 8.9 - 10.3 mg/dL 8.7    ? ? ?  Latest Ref Rng & Units 01/19/2022  ?  2:06 PM  ?CBC  ?WBC 4.0 - 10.5 K/uL 10.0    ?Hemoglobin 12.0 - 15.0 g/dL 7.8    ?Hematocrit 36.0 - 46.0 % 28.6    ?Platelets 150 - 400 K/uL 333    ? ? ?No images are attached to the encounter. ? ?CT Renal Stone Study ? ?Result Date: 01/19/2022 ?CLINICAL DATA:  Left flank pain EXAM: CT ABDOMEN AND PELVIS WITHOUT CONTRAST TECHNIQUE: Multidetector CT imaging of the abdomen and pelvis was performed following the standard protocol without IV contrast. RADIATION DOSE REDUCTION: This exam was performed according to the departmental dose-optimization program which includes automated exposure control, adjustment of the mA and/or kV according to patient size and/or use of iterative reconstruction technique. COMPARISON:  01/02/2022 FINDINGS: Lower chest: No focal pulmonary opacity. No pleural effusion. No pericardial effusion. Hepatobiliary: No focal liver abnormality is seen. Cholelithiasis. No pericholecystic fluid, gallbladder wall thickening, or biliary dilatation. Pancreas: Unremarkable. No pancreatic ductal dilatation or surrounding inflammatory changes. Spleen: Normal in size without focal abnormality. Adrenals/Urinary Tract: The adrenal  glands are unremarkable. The kidneys are symmetric in size. No nephrolithiasis. The visualized course of the ureters is unremarkable. The bladder is normal for degree of distension. No stone is seen within the bl

## 2022-02-08 LAB — CELIAC DISEASE PANEL
Endomysial Ab, IgA: NEGATIVE
IgA: 294 mg/dL (ref 87–352)
Tissue Transglutaminase Ab, IgA: <2 U/mL (ref 0–3)

## 2022-02-09 ENCOUNTER — Other Ambulatory Visit: Payer: Self-pay

## 2022-02-09 DIAGNOSIS — D219 Benign neoplasm of connective and other soft tissue, unspecified: Secondary | ICD-10-CM

## 2022-02-11 ENCOUNTER — Ambulatory Visit: Payer: Medicaid Other

## 2022-02-14 ENCOUNTER — Inpatient Hospital Stay: Payer: 59

## 2022-02-16 ENCOUNTER — Inpatient Hospital Stay: Payer: 59

## 2022-02-18 ENCOUNTER — Inpatient Hospital Stay: Payer: 59

## 2022-02-18 ENCOUNTER — Encounter: Payer: Self-pay | Admitting: Obstetrics and Gynecology

## 2022-02-21 ENCOUNTER — Encounter: Payer: Self-pay | Admitting: Oncology

## 2022-02-22 ENCOUNTER — Inpatient Hospital Stay: Payer: 59

## 2022-02-24 ENCOUNTER — Inpatient Hospital Stay: Payer: 59

## 2022-03-01 ENCOUNTER — Encounter: Payer: Self-pay | Admitting: Oncology

## 2022-03-01 NOTE — Progress Notes (Deleted)
    GYNECOLOGY PROGRESS NOTE  Subjective:    Patient ID: Carolyn [name], female    DOB: 12-Nov-1980, 41 y.o.   MRN: [id number]  HPI  Patient is a 41 y.o. G79P1001 female who presents for consultation to have fibroids removed.  {Common ambulatory SmartLinks:19316}  Review of Systems {ros; complete:30496}   Objective:   There were no vitals taken for this visit. There is no height or weight on file to calculate BMI. General appearance: {general exam:16600} Abdomen: {abdominal exam:16834} Pelvic: {pelvic exam:16852::"cervix normal in appearance","external genitalia normal","no adnexal masses or tenderness","no cervical motion tenderness","rectovaginal septum normal","uterus normal size, shape, and consistency","vagina normal without discharge"} Extremities: {extremity exam:5109} Neurologic: {neuro exam:17854}   Assessment:   No diagnosis found.   Plan:   There are no diagnoses linked to this encounter.    Rubie Maid, MD [facility]

## 2022-03-02 ENCOUNTER — Encounter: Payer: 59 | Admitting: Obstetrics and Gynecology

## 2022-03-25 ENCOUNTER — Telehealth: Payer: Self-pay | Admitting: Family Medicine

## 2022-03-25 ENCOUNTER — Ambulatory Visit (LOCAL_COMMUNITY_HEALTH_CENTER): Payer: Self-pay

## 2022-03-25 DIAGNOSIS — Z111 Encounter for screening for respiratory tuberculosis: Secondary | ICD-10-CM

## 2022-03-25 NOTE — Telephone Encounter (Signed)
Pt wanted to schedule an appt to have her PE and BC. She states that when she came last November, she was sent to get her fibroids checked and removed AND that provider did not complete her PE. Please confirm, in case I need to modify her appt. Thanks

## 2022-03-28 ENCOUNTER — Ambulatory Visit (LOCAL_COMMUNITY_HEALTH_CENTER): Payer: Self-pay

## 2022-03-28 DIAGNOSIS — Z111 Encounter for screening for respiratory tuberculosis: Secondary | ICD-10-CM

## 2022-03-28 LAB — TB SKIN TEST
Induration: 0 mm
TB Skin Test: NEGATIVE

## 2022-03-31 ENCOUNTER — Ambulatory Visit: Payer: Medicaid Other

## 2022-04-06 ENCOUNTER — Encounter: Payer: Self-pay | Admitting: Emergency Medicine

## 2022-04-06 ENCOUNTER — Emergency Department: Payer: 59

## 2022-04-06 ENCOUNTER — Other Ambulatory Visit: Payer: Self-pay

## 2022-04-06 ENCOUNTER — Emergency Department
Admission: EM | Admit: 2022-04-06 | Discharge: 2022-04-06 | Disposition: A | Discharge status: Home / Self Care | Payer: 59 | Attending: Emergency Medicine | Admitting: Emergency Medicine

## 2022-04-06 DIAGNOSIS — N840 Polyp of corpus uteri: Secondary | ICD-10-CM | POA: Diagnosis not present

## 2022-04-06 DIAGNOSIS — R102 Pelvic and perineal pain: Secondary | ICD-10-CM | POA: Diagnosis not present

## 2022-04-06 DIAGNOSIS — J40 Bronchitis, not specified as acute or chronic: Secondary | ICD-10-CM

## 2022-04-06 DIAGNOSIS — N730 Acute parametritis and pelvic cellulitis: Secondary | ICD-10-CM

## 2022-04-06 DIAGNOSIS — R079 Chest pain, unspecified: Secondary | ICD-10-CM | POA: Diagnosis not present

## 2022-04-06 DIAGNOSIS — Z720 Tobacco use: Secondary | ICD-10-CM

## 2022-04-06 DIAGNOSIS — E876 Hypokalemia: Secondary | ICD-10-CM

## 2022-04-06 DIAGNOSIS — R109 Unspecified abdominal pain: Secondary | ICD-10-CM | POA: Diagnosis not present

## 2022-04-06 DIAGNOSIS — D649 Anemia, unspecified: Secondary | ICD-10-CM | POA: Diagnosis not present

## 2022-04-06 DIAGNOSIS — N939 Abnormal uterine and vaginal bleeding, unspecified: Secondary | ICD-10-CM | POA: Diagnosis not present

## 2022-04-06 DIAGNOSIS — Z20822 Contact with and (suspected) exposure to covid-19: Secondary | ICD-10-CM | POA: Insufficient documentation

## 2022-04-06 DIAGNOSIS — N83201 Unspecified ovarian cyst, right side: Secondary | ICD-10-CM

## 2022-04-06 DIAGNOSIS — N739 Female pelvic inflammatory disease, unspecified: Secondary | ICD-10-CM | POA: Diagnosis not present

## 2022-04-06 DIAGNOSIS — N83291 Other ovarian cyst, right side: Secondary | ICD-10-CM | POA: Diagnosis not present

## 2022-04-06 DIAGNOSIS — D259 Leiomyoma of uterus, unspecified: Secondary | ICD-10-CM | POA: Diagnosis not present

## 2022-04-06 DIAGNOSIS — N848 Polyp of other parts of female genital tract: Secondary | ICD-10-CM | POA: Diagnosis not present

## 2022-04-06 DIAGNOSIS — R059 Cough, unspecified: Secondary | ICD-10-CM | POA: Diagnosis not present

## 2022-04-06 LAB — CBC
HCT: 26.5 % — ABNORMAL LOW (ref 36.0–46.0)
Hemoglobin: 7.3 g/dL — ABNORMAL LOW (ref 12.0–15.0)
MCH: 18.1 pg — ABNORMAL LOW (ref 26.0–34.0)
MCHC: 27.5 g/dL — ABNORMAL LOW (ref 30.0–36.0)
MCV: 65.6 fL — ABNORMAL LOW (ref 80.0–100.0)
Platelets: 304 10*3/uL (ref 150–400)
RBC: 4.04 MIL/uL (ref 3.87–5.11)
RDW: 22.2 % — ABNORMAL HIGH (ref 11.5–15.5)
WBC: 5.7 10*3/uL (ref 4.0–10.5)
nRBC: 0 % (ref 0.0–0.2)

## 2022-04-06 LAB — TYPE AND SCREEN
ABO/RH(D): A POS
Antibody Screen: NEGATIVE

## 2022-04-06 LAB — IRON AND TIBC
Iron: 12 ug/dL — ABNORMAL LOW (ref 28–170)
Saturation Ratios: 3 % — ABNORMAL LOW (ref 10.4–31.8)
TIBC: 458 ug/dL — ABNORMAL HIGH (ref 250–450)
UIBC: 446 ug/dL

## 2022-04-06 LAB — WET PREP, GENITAL
Sperm: NONE SEEN
Trich, Wet Prep: NONE SEEN
WBC, Wet Prep HPF POC: <10 (ref <10)
Yeast Wet Prep HPF POC: NONE SEEN

## 2022-04-06 LAB — COMPREHENSIVE METABOLIC PANEL
ALT: 11 U/L (ref 0–44)
AST: 18 U/L (ref 15–41)
Albumin: 3.7 g/dL (ref 3.5–5.0)
Alkaline Phosphatase: 45 U/L (ref 38–126)
Anion gap: 6 (ref 5–15)
BUN: 11 mg/dL (ref 6–20)
CO2: 25 mmol/L (ref 22–32)
Calcium: 9.1 mg/dL (ref 8.9–10.3)
Chloride: 113 mmol/L — ABNORMAL HIGH (ref 98–111)
Creatinine, Ser: 0.69 mg/dL (ref 0.44–1.00)
GFR, Estimated: >60 mL/min (ref >60)
Glucose, Bld: 140 mg/dL — ABNORMAL HIGH (ref 70–99)
Potassium: 3.2 mmol/L — ABNORMAL LOW (ref 3.5–5.1)
Sodium: 144 mmol/L (ref 135–145)
Total Bilirubin: 0.6 mg/dL (ref 0.3–1.2)
Total Protein: 7.3 g/dL (ref 6.5–8.1)

## 2022-04-06 LAB — URINALYSIS, ROUTINE W REFLEX MICROSCOPIC
Bacteria, UA: NONE SEEN
Bilirubin Urine: NEGATIVE
Glucose, UA: NEGATIVE mg/dL
Ketones, ur: 5 mg/dL — AB
Nitrite: NEGATIVE
Protein, ur: 30 mg/dL — AB
Specific Gravity, Urine: 1.03 (ref 1.005–1.030)
Squamous Epithelial / HPF: >50 — ABNORMAL HIGH (ref 0–5)
pH: 5 (ref 5.0–8.0)

## 2022-04-06 LAB — RESP PANEL BY RT-PCR (FLU A&B, COVID) ARPGX2
Influenza A by PCR: NEGATIVE
Influenza B by PCR: NEGATIVE
SARS Coronavirus 2 by RT PCR: NEGATIVE

## 2022-04-06 LAB — LIPASE, BLOOD: Lipase: 43 U/L (ref 11–51)

## 2022-04-06 LAB — MAGNESIUM: Magnesium: 2 mg/dL (ref 1.7–2.4)

## 2022-04-06 LAB — POC URINE PREG, ED: Preg Test, Ur: NEGATIVE

## 2022-04-06 LAB — HIV ANTIBODY (ROUTINE TESTING W REFLEX): HIV Screen 4th Generation wRfx: NONREACTIVE

## 2022-04-06 LAB — CHLAMYDIA/NGC RT PCR (ARMC ONLY)
Chlamydia Tr: NOT DETECTED
N gonorrhoeae: NOT DETECTED

## 2022-04-06 MED ORDER — DOXYCYCLINE HYCLATE 100 MG PO CAPS: 100.0000 mg | ORAL_CAPSULE | Freq: Two times a day (BID) | ORAL | 0 refills | Status: AC

## 2022-04-06 MED ORDER — POTASSIUM CHLORIDE CRYS ER 20 MEQ PO TBCR
40.0000 meq | EXTENDED_RELEASE_TABLET | Freq: Once | ORAL | Status: AC
  Administered 2022-04-06: 40 meq via ORAL
  Filled 2022-04-06: qty 2

## 2022-04-06 MED ORDER — METRONIDAZOLE 500 MG PO TABS: 500.0000 mg | ORAL_TABLET | Freq: Two times a day (BID) | ORAL | 0 refills | Status: AC

## 2022-04-06 MED ORDER — METRONIDAZOLE 500 MG PO TABS
500.0000 mg | ORAL_TABLET | Freq: Once | ORAL | Status: AC
  Administered 2022-04-06: 500 mg via ORAL
  Filled 2022-04-06: qty 1

## 2022-04-06 MED ORDER — MEDROXYPROGESTERONE ACETATE 10 MG PO TABS: 10.0000 mg | ORAL_TABLET | Freq: Every day | ORAL | 0 refills | Status: DC

## 2022-04-06 MED ORDER — LACTATED RINGERS IV BOLUS
1000.0000 mL | Freq: Once | INTRAVENOUS | Status: AC
  Administered 2022-04-06: 1000 mL via INTRAVENOUS

## 2022-04-06 MED ORDER — CEFTRIAXONE SODIUM 1 G IJ SOLR
500.0000 mg | Freq: Once | INTRAMUSCULAR | Status: DC
  Filled 2022-04-06: qty 10

## 2022-04-06 MED ORDER — ACETAMINOPHEN 500 MG PO TABS
1000.0000 mg | ORAL_TABLET | Freq: Once | ORAL | Status: AC
Start: 2022-04-06 — End: 2022-04-06
  Administered 2022-04-06: 1000 mg via ORAL
  Filled 2022-04-06: qty 2

## 2022-04-06 MED ORDER — SODIUM CHLORIDE 0.9 % IV SOLN
1.0000 g | Freq: Once | INTRAVENOUS | Status: AC
  Administered 2022-04-06: 1 g via INTRAVENOUS
  Filled 2022-04-06: qty 10

## 2022-04-06 MED ORDER — IBUPROFEN 400 MG PO TABS
400.0000 mg | ORAL_TABLET | Freq: Once | ORAL | Status: AC
Start: 2022-04-06 — End: 2022-04-06
  Administered 2022-04-06: 400 mg via ORAL
  Filled 2022-04-06: qty 1

## 2022-04-06 MED ORDER — DOXYCYCLINE HYCLATE 100 MG PO TABS
100.0000 mg | ORAL_TABLET | Freq: Once | ORAL | Status: AC
  Administered 2022-04-06: 100 mg via ORAL
  Filled 2022-04-06: qty 1

## 2022-04-06 NOTE — Discharge Instructions (Addendum)
Your Ultrasound today showed: IMPRESSION: 1. Mild endometrial thickening, with suspected endometrial polyp measuring up to 1.7 cm as above. Further evaluation with sonohysterogram may be useful for workup of focal abnormality. (Ref: Radiological Reasoning: Algorithmic Workup of Abnormal Vaginal Bleeding with Endovaginal Sonography and Sonohysterography. AJR 2008; 429:I37-95) 2. Decreased minimally complex right ovarian cyst, most consistent with resolving hemorrhagic cyst.

## 2022-04-06 NOTE — ED Triage Notes (Signed)
Patient to ED via POV for abd pain that radiates into back with some vaginal bleeding- last period was approx 1 week ago per patient. Patient denies N/V/D but complains of generalized weakness.

## 2022-04-06 NOTE — ED Notes (Signed)
E signature pad not working. Pt educated on discharge instructions and verbalized understanding.  

## 2022-04-06 NOTE — ED Provider Notes (Signed)
[facility] Provider Note    Event Date/Time   First MD Initiated Contact with Patient 04/06/22 1610     (approximate)   History   Abdominal Pain   HPI  Carolyn [name] is a 41 y.o. female with past medical history of tobacco abuse, chronic anemia previously receiving iron infusions, fibroids and GERD who presents for evaluation of some pelvic pain and abnormal uterine bleeding.  She states she had her normal menstrual period last week and that she was doing fine without any bleeding but started bleeding in the last 24 hours and is having some crampy lower pelvic pain rating to her back.  She is sexually active and is not using protection.  She denies any abnormal discharge or burning with urination.  Mild suprapubic pain but no other abdominal pain.  She also reports she has had a couple days of cough productive with phlegm and some congestion.  No earache, sore throat, headache, chest pain, shortness of breath vomiting or diarrhea.  She states she think she is little constipated and has been probably 2 or 3 days since she last pooped.  She is on iron supplements and has been taking Tylenol PM containing Benadryl for sleep.  No recent illicit drug use or daily EtOH use although patient does drink excessively occasionally.  She is not on any blood thinners.    Past Medical History:  Diagnosis Date   Anemia    Fibroids    GERD (gastroesophageal reflux disease)    Sickle cell trait [facility])      Physical Exam  Triage Vital Signs: ED Triage Vitals  Enc Vitals Group     BP 04/06/22 1030 129/72     Pulse Rate 04/06/22 1030 72     Resp 04/06/22 1030 18     Temp 04/06/22 1030 98.5 F (36.9 C)     Temp Source 04/06/22 1030 Oral     SpO2 04/06/22 1030 99 %     Weight 04/06/22 1030 165 lb (74.8 kg)     Height 04/06/22 1030 '5\' 9"'$  (1.753 m)     Head Circumference --      Peak Flow --      Pain Score 04/06/22 1042 7     Pain Loc --      Pain Edu? --      Excl.  in [place]? --     Most recent vital signs: Vitals:   04/06/22 1030 04/06/22 1806  BP: 129/72 126/88  Pulse: 72 73  Resp: 18 20  Temp: 98.5 F (36.9 C)   SpO2: 99% 100%    General: Awake, no distress.  CV:  Good peripheral perfusion.  Resp:  Normal effort.  Clear bilaterally. Abd:  No distention.  Mild tenderness in suprapubic region.  No upper abdominal tenderness or CVA tenderness.  Some mild erythema and friability at the cervix.  There is some dried blood from a closed os and no active bleeding. Other:     ED Results / Procedures / Treatments  Labs (all labs ordered are listed, but only abnormal results are displayed) Labs Reviewed  WET PREP, GENITAL - Abnormal; Notable for the following components:      Result Value   Clue Cells Wet Prep HPF POC PRESENT (*)    All other components within normal limits  COMPREHENSIVE METABOLIC PANEL - Abnormal; Notable for the following components:   Potassium 3.2 (*)    Chloride 113 (*)    Glucose, Bld  140 (*)    All other components within normal limits  CBC - Abnormal; Notable for the following components:   Hemoglobin 7.3 (*)    HCT 26.5 (*)    MCV 65.6 (*)    MCH 18.1 (*)    MCHC 27.5 (*)    RDW 22.2 (*)    All other components within normal limits  URINALYSIS, ROUTINE W REFLEX MICROSCOPIC - Abnormal; Notable for the following components:   Color, Urine AMBER (*)    APPearance CLOUDY (*)    Hgb urine dipstick LARGE (*)    Ketones, ur 5 (*)    Protein, ur 30 (*)    Leukocytes,Ua TRACE (*)    Squamous Epithelial / LPF >50 (*)    All other components within normal limits  IRON AND TIBC - Abnormal; Notable for the following components:   Iron 12 (*)    TIBC 458 (*)    Saturation Ratios 3 (*)    All other components within normal limits  RESP PANEL BY RT-PCR (FLU A&B, COVID) ARPGX2  CHLAMYDIA/NGC RT PCR ([facility] ONLY)            LIPASE, BLOOD  MAGNESIUM  HIV ANTIBODY (ROUTINE TESTING W REFLEX)  RPR  POC URINE PREG, ED  TYPE  AND SCREEN     EKG    RADIOLOGY Chest reviewed by myself shows no focal consoidation, effusion, edema, pneumothorax or other clear acute thoracic process. I also reviewed radiology interpretation and agree with findings described.   Pelvic ultrasound on my interpretation shows no evidence of decreased flow to the ovaries or free fluid.  I reviewed radiology interpretation and agree to findings of mild endometrial thickening and suspected polyp with recommendation for continued outpatient evaluation as well as what appears to be resolving right hemorrhagic ovarian cyst without any other acute process.  PROCEDURES:  Critical Care performed: No  Procedures   MEDICATIONS ORDERED IN ED: Medications  cefTRIAXone (ROCEPHIN) injection 500 mg (has no administration in time range)  doxycycline (VIBRA-TABS) tablet 100 mg (has no administration in time range)  metroNIDAZOLE (FLAGYL) tablet 500 mg (has no administration in time range)  potassium chloride SA (KLOR-CON M) CR tablet 40 mEq (40 mEq Oral Given 04/06/22 1720)  lactated ringers bolus 1,000 mL (1,000 mLs Intravenous New Bag/Given 04/06/22 1720)  acetaminophen (TYLENOL) tablet 1,000 mg (1,000 mg Oral Given 04/06/22 1720)  ibuprofen (ADVIL) tablet 400 mg (400 mg Oral Given 04/06/22 1720)     IMPRESSION / MDM / ASSESSMENT AND PLAN / ED COURSE  I reviewed the triage vital signs and the nursing notes. Patient's presentation is most consistent with acute presentation with potential threat to life or bodily function.                               Differential diagnosis includes, but is not limited to regard to patient's cough and congestion differential considerations include bacterial pneumonia versus a viral bronchitis with overall lower suspicion based on her history and exam for CHF, GERD, arrhythmia or PE.  Chest x-ray is negative.  COVID test is negative and influenza test is negative.  I suspect other viral URI causing patient's cough  and congestion.  With regard to her suprapubic discomfort and vaginal bleeding it is occurring an abnormal time for her differential considerations include possible bleeding and pain from fibroid, endometriosis, polyp, acute infectious process.  Given association with bleeding of the lower suspicion for  kidney stone or appendicitis.  Exam is concerning for PID.  CMP is remarkable for K of 3.2 without any other significant lecture light or metabolic derangements.  No evidence of acute hepatitis or cholestatic process.  Lipase is WNL and not suggestive of pancreatitis.  CBC without leukocytosis and stable anemia with hemoglobin of 7.3.  It seems patient ranges between 7.5 and 8.2.  Platelets are normal.  Pregnancy test is negative.  UA shows hemoglobin trace leukocyte esterase and some RBCs as well as 6-10 WBCs but appears contaminated with greater than 50 squamous epithelial cells.  Pregnancy test is negative.  Wet prep positive for clue cells.  HIV RPR and GC studies sent as well as urine culture.  Pelvic ultrasound on my interpretation shows no evidence of decreased flow to the ovaries or free fluid.  I reviewed radiology interpretation and agree to findings of mild endometrial thickening and suspected polyp with recommendation for continued outpatient evaluation as well as what appears to be resolving right hemorrhagic ovarian cyst without any other acute process.  Discussed with patient my concern that she has pelvic inflammatory disease and possibly some abnormal bleeding related to this versus from polyp or hormonal imbalances.  Discussed importance of close outpatient gynecology follow-up for continued evaluation.  We will treat with a dose of IM Rocephin and doxycycline following a course of Flagyl.  Discussed that she should avoid intercourse until she can get the results of all her testing and be cleared to return by gynecology.  She is agreeable to plan.  Also discussed at length patient's anemia  and importance of close outpatient hematology follow-up for possible iron infusion.  She is iron deficient on iron panel today and she is already on oral supplementation.  No indication for transfusion at this time.  She is requesting refill of her birth control which I will do which may help her bleeding as well.  Discharged in stable condition.  Strict return precautions advised and discussed.  Counseled on tobacco cessation.      FINAL CLINICAL IMPRESSION(S) / ED DIAGNOSES   Final diagnoses:  Bronchitis  PID (acute pelvic inflammatory disease)  Cyst of right ovary  Endometrial polyp  Abnormal uterine bleeding  Tobacco abuse  Anemia, unspecified type  Hypokalemia     Rx / DC Orders   ED Discharge Orders          Ordered    medroxyPROGESTERone (PROVERA) 10 MG tablet  Daily        04/06/22 1838    doxycycline (VIBRAMYCIN) 100 MG capsule  2 times daily        04/06/22 1838    metroNIDAZOLE (FLAGYL) 500 MG tablet  2 times daily        04/06/22 1838             Note:  This document was prepared using Dragon voice recognition software and may include unintentional dictation errors.   Lucrezia Starch, MD 04/06/22 [phone]

## 2022-04-06 NOTE — ED Notes (Signed)
Urine cup has been given to patient.

## 2022-04-06 NOTE — ED Provider Triage Note (Signed)
Emergency Medicine Provider Triage Evaluation Note  Carolyn [name] , a 41 y.o. female  was evaluated in triage.  Pt complains of vaginal bleeding. Second period within the past 2 weeks.  No nausea, vomiting, or diarrhea. Feels weak.   Physical Exam  BP 129/72 (BP Location: Left Arm)   Pulse 72   Temp 98.5 F (36.9 C) (Oral)   Resp 18   Ht '5\' 9"'$  (1.753 m)   Wt 74.8 kg   SpO2 99%   BMI 24.37 kg/m  Gen:   Awake, no distress   Resp:  Normal effort  MSK:   Moves extremities without difficulty  Other:    Medical Decision Making  Medically screening exam initiated at 11:01 AM.  Appropriate orders placed.  Jamilet D Duell was informed that the remainder of the evaluation will be completed by another provider, this initial triage assessment does not replace that evaluation, and the importance of remaining in the ED until their evaluation is complete.    Victorino Dike, FNP 04/06/22 1102

## 2022-04-07 LAB — RPR: RPR Ser Ql: NONREACTIVE

## 2022-04-11 ENCOUNTER — Inpatient Hospital Stay: Payer: 59 | Attending: Oncology

## 2022-04-15 ENCOUNTER — Inpatient Hospital Stay: Payer: 59 | Admitting: Oncology

## 2022-04-19 ENCOUNTER — Ambulatory Visit: Payer: Medicaid Other

## 2022-04-25 ENCOUNTER — Ambulatory Visit: Payer: Self-pay | Admitting: *Deleted

## 2022-04-25 NOTE — Telephone Encounter (Signed)
  Chief Complaint: AUB Symptoms: vaginal bleeding, fatigue, passing large clots, was seen at ED- given Provera- bleeding has increased Frequency: 7 days Pertinent Negatives: Patient denies passed tissue, vaginal discharge, fever Disposition: '[x]'$ ED /'[]'$ Urgent Care (no appt availability in office) / '[]'$ Appointment(In office/virtual)/ '[]'$  [facility]/ '[]'$ Home Care/ '[]'$ Refused Recommended Disposition /'[]'$ [facility]/ '[]'$  Follow-up with PCP Additional Notes: No PCP- sees Health dept for GYN care, advised call them tomorrow- go to ED now- due to prolonged bleeding ,anemia

## 2022-04-25 NOTE — Telephone Encounter (Signed)
Reason for Disposition  MODERATE vaginal bleeding (e.g., soaking 1 pad or tampon per hour and present > 6 hours; 1 menstrual cup every 6 hours)  Answer Assessment - Initial Assessment Questions 1. AMOUNT: "Describe the bleeding that you are having."    - SPOTTING: spotting, or pinkish / brownish mucous discharge; does not fill panty liner or pad    - MILD:  less than 1 pad / hour; less than patient's usual menstrual bleeding   - MODERATE: 1-2 pads / hour; 1 menstrual cup every 6 hours; small-medium blood clots (e.g., pea, grape, small coin)   - SEVERE: soaking 2 or more pads/hour for 2 or more hours; 1 menstrual cup every 2 hours; bleeding not contained by pads or continuous red blood from vagina; large blood clots (e.g., golf ball, large coin)      5-6 tampons/day-super- leaking through, large clots 2. ONSET: "When did the bleeding begin?" "Is it continuing now?"     7 days ago- using provera 3. MENSTRUAL PERIOD: "When was the last normal menstrual period?" "How is this different than your period?"     Last month- end of month 4. REGULARITY: "How regular are your periods?"     yes 5. ABDOMEN PAIN: "Do you have any pain?" "How bad is the pain?"  (e.g., Scale 1-10; mild, moderate, or severe)   - MILD (1-3): doesn't interfere with normal activities, abdomen soft and not tender to touch    - MODERATE (4-7): interferes with normal activities or awakens from sleep, abdomen tender to touch    - SEVERE (8-10): excruciating pain, doubled over, unable to do any normal activities      moderate 6. PREGNANCY: "Is there any chance you are pregnant?" "When was your last menstrual period?"     no 7. BREASTFEEDING: "Are you breastfeeding?"     no 8. HORMONE MEDICINES: "Are you taking any hormone medicines, prescription or over-the-counter?" (e.g., birth control pills, estrogen)     Provera- 7 day 9. BLOOD THINNER MEDICINES: "Do you take any blood thinners?" (e.g., Coumadin / warfarin, Pradaxa /  dabigatran, aspirin)     no 10. CAUSE: "What do you think is causing the bleeding?" (e.g., recent gyn surgery, recent gyn procedure; known bleeding disorder, cervical cancer, polycystic ovarian disease, fibroids)         Unsure- hx fibroids 11. HEMODYNAMIC STATUS: "Are you weak or feeling lightheaded?" If Yes, ask: "Can you stand and walk normally?"        Weakness- can stand/walk normal 12. OTHER SYMPTOMS: "What other symptoms are you having with the bleeding?" (e.g., passed tissue, vaginal discharge, fever, menstrual-type cramps)       Cramps, clots  Protocols used: Vaginal Bleeding - Abnormal-A-AH

## 2022-05-03 ENCOUNTER — Inpatient Hospital Stay: Payer: 59 | Attending: Oncology

## 2022-05-05 ENCOUNTER — Inpatient Hospital Stay: Payer: 59 | Admitting: Oncology

## 2022-06-22 ENCOUNTER — Encounter: Payer: 59 | Admitting: Obstetrics and Gynecology

## 2022-08-21 ENCOUNTER — Encounter: Payer: Self-pay | Admitting: Radiology

## 2022-08-21 ENCOUNTER — Observation Stay
Admission: EM | Admit: 2022-08-21 | Discharge: 2022-08-23 | Disposition: A | Discharge status: Home / Self Care | Payer: 59 | Attending: Emergency Medicine | Admitting: Emergency Medicine

## 2022-08-21 ENCOUNTER — Emergency Department: Payer: 59

## 2022-08-21 ENCOUNTER — Other Ambulatory Visit: Payer: Self-pay

## 2022-08-21 DIAGNOSIS — Z1152 Encounter for screening for COVID-19: Secondary | ICD-10-CM | POA: Diagnosis not present

## 2022-08-21 DIAGNOSIS — K449 Diaphragmatic hernia without obstruction or gangrene: Secondary | ICD-10-CM | POA: Diagnosis not present

## 2022-08-21 DIAGNOSIS — E876 Hypokalemia: Secondary | ICD-10-CM | POA: Diagnosis not present

## 2022-08-21 DIAGNOSIS — D573 Sickle-cell trait: Secondary | ICD-10-CM | POA: Diagnosis present

## 2022-08-21 DIAGNOSIS — Z6826 Body mass index (BMI) 26.0-26.9, adult: Secondary | ICD-10-CM | POA: Diagnosis not present

## 2022-08-21 DIAGNOSIS — K2970 Gastritis, unspecified, without bleeding: Principal | ICD-10-CM | POA: Diagnosis present

## 2022-08-21 DIAGNOSIS — F5089 Other specified eating disorder: Secondary | ICD-10-CM

## 2022-08-21 DIAGNOSIS — E663 Overweight: Secondary | ICD-10-CM | POA: Diagnosis present

## 2022-08-21 DIAGNOSIS — F17210 Nicotine dependence, cigarettes, uncomplicated: Secondary | ICD-10-CM | POA: Diagnosis not present

## 2022-08-21 DIAGNOSIS — Z23 Encounter for immunization: Secondary | ICD-10-CM | POA: Diagnosis not present

## 2022-08-21 DIAGNOSIS — K5909 Other constipation: Secondary | ICD-10-CM | POA: Diagnosis present

## 2022-08-21 DIAGNOSIS — K219 Gastro-esophageal reflux disease without esophagitis: Secondary | ICD-10-CM | POA: Diagnosis present

## 2022-08-21 DIAGNOSIS — K922 Gastrointestinal hemorrhage, unspecified: Secondary | ICD-10-CM | POA: Diagnosis not present

## 2022-08-21 DIAGNOSIS — Z800 Family history of malignant neoplasm of digestive organs: Secondary | ICD-10-CM

## 2022-08-21 DIAGNOSIS — F411 Generalized anxiety disorder: Secondary | ICD-10-CM | POA: Diagnosis present

## 2022-08-21 DIAGNOSIS — D500 Iron deficiency anemia secondary to blood loss (chronic): Secondary | ICD-10-CM | POA: Diagnosis present

## 2022-08-21 DIAGNOSIS — F321 Major depressive disorder, single episode, moderate: Secondary | ICD-10-CM | POA: Diagnosis present

## 2022-08-21 DIAGNOSIS — Z8711 Personal history of peptic ulcer disease: Secondary | ICD-10-CM

## 2022-08-21 DIAGNOSIS — Z791 Long term (current) use of non-steroidal anti-inflammatories (NSAID): Secondary | ICD-10-CM | POA: Diagnosis not present

## 2022-08-21 DIAGNOSIS — D62 Acute posthemorrhagic anemia: Secondary | ICD-10-CM | POA: Diagnosis not present

## 2022-08-21 DIAGNOSIS — Z8719 Personal history of other diseases of the digestive system: Secondary | ICD-10-CM

## 2022-08-21 DIAGNOSIS — D649 Anemia, unspecified: Secondary | ICD-10-CM | POA: Diagnosis present

## 2022-08-21 DIAGNOSIS — I10 Essential (primary) hypertension: Secondary | ICD-10-CM | POA: Diagnosis not present

## 2022-08-21 DIAGNOSIS — J111 Influenza due to unidentified influenza virus with other respiratory manifestations: Secondary | ICD-10-CM | POA: Diagnosis not present

## 2022-08-21 DIAGNOSIS — Z79899 Other long term (current) drug therapy: Secondary | ICD-10-CM | POA: Diagnosis not present

## 2022-08-21 DIAGNOSIS — K2971 Gastritis, unspecified, with bleeding: Secondary | ICD-10-CM | POA: Diagnosis not present

## 2022-08-21 DIAGNOSIS — R1013 Epigastric pain: Secondary | ICD-10-CM | POA: Diagnosis present

## 2022-08-21 LAB — CBC WITH DIFFERENTIAL/PLATELET
Abs Immature Granulocytes: 0.02 10*3/uL (ref 0.00–0.07)
Basophils Absolute: 0 10*3/uL (ref 0.0–0.1)
Basophils Relative: 0 %
Eosinophils Absolute: 0 10*3/uL (ref 0.0–0.5)
Eosinophils Relative: 0 %
HCT: 23.2 % — ABNORMAL LOW (ref 36.0–46.0)
Hemoglobin: 6.1 g/dL — ABNORMAL LOW (ref 12.0–15.0)
Immature Granulocytes: 0 %
Lymphocytes Relative: 17 %
Lymphs Abs: 1 10*3/uL (ref 0.7–4.0)
MCH: 16 pg — ABNORMAL LOW (ref 26.0–34.0)
MCHC: 26.3 g/dL — ABNORMAL LOW (ref 30.0–36.0)
MCV: 60.9 fL — ABNORMAL LOW (ref 80.0–100.0)
Monocytes Absolute: 0.4 10*3/uL (ref 0.1–1.0)
Monocytes Relative: 7 %
Neutro Abs: 4.4 10*3/uL (ref 1.7–7.7)
Neutrophils Relative %: 76 %
Platelets: 260 10*3/uL (ref 150–400)
RBC: 3.81 MIL/uL — ABNORMAL LOW (ref 3.87–5.11)
RDW: 22 % — ABNORMAL HIGH (ref 11.5–15.5)
Smear Review: ADEQUATE
WBC: 5.8 10*3/uL (ref 4.0–10.5)
nRBC: 0 % (ref 0.0–0.2)

## 2022-08-21 LAB — HEPATIC FUNCTION PANEL
ALT: 11 U/L (ref 0–44)
AST: 21 U/L (ref 15–41)
Albumin: 3.9 g/dL (ref 3.5–5.0)
Alkaline Phosphatase: 48 U/L (ref 38–126)
Bilirubin, Direct: 0.1 mg/dL (ref 0.0–0.2)
Indirect Bilirubin: 0.7 mg/dL (ref 0.3–0.9)
Total Bilirubin: 0.8 mg/dL (ref 0.3–1.2)
Total Protein: 7.7 g/dL (ref 6.5–8.1)

## 2022-08-21 LAB — BASIC METABOLIC PANEL
Anion gap: 7 (ref 5–15)
BUN: 8 mg/dL (ref 6–20)
CO2: 24 mmol/L (ref 22–32)
Calcium: 8.8 mg/dL — ABNORMAL LOW (ref 8.9–10.3)
Chloride: 106 mmol/L (ref 98–111)
Creatinine, Ser: 0.75 mg/dL (ref 0.44–1.00)
GFR, Estimated: >60 mL/min (ref >60)
Glucose, Bld: 113 mg/dL — ABNORMAL HIGH (ref 70–99)
Potassium: 3.2 mmol/L — ABNORMAL LOW (ref 3.5–5.1)
Sodium: 137 mmol/L (ref 135–145)

## 2022-08-21 LAB — RETICULOCYTES
Immature Retic Fract: 9.2 % (ref 2.3–15.9)
RBC.: 3.78 MIL/uL — ABNORMAL LOW (ref 3.87–5.11)
Retic Count, Absolute: 31.4 10*3/uL (ref 19.0–186.0)
Retic Ct Pct: 0.8 % (ref 0.4–3.1)

## 2022-08-21 LAB — IRON AND TIBC
Iron: 19 ug/dL — ABNORMAL LOW (ref 28–170)
Saturation Ratios: 4 % — ABNORMAL LOW (ref 10.4–31.8)
TIBC: 483 ug/dL — ABNORMAL HIGH (ref 250–450)
UIBC: 464 ug/dL

## 2022-08-21 LAB — RESP PANEL BY RT-PCR (FLU A&B, COVID) ARPGX2
Influenza A by PCR: NEGATIVE
Influenza B by PCR: NEGATIVE
SARS Coronavirus 2 by RT PCR: NEGATIVE

## 2022-08-21 LAB — SARS CORONAVIRUS 2 BY RT PCR: SARS Coronavirus 2 by RT PCR: NEGATIVE

## 2022-08-21 LAB — PROTIME-INR
INR: 1.1 (ref 0.8–1.2)
Prothrombin Time: 14 seconds (ref 11.4–15.2)

## 2022-08-21 LAB — VITAMIN B12: Vitamin B-12: 341 pg/mL (ref 180–914)

## 2022-08-21 LAB — MAGNESIUM: Magnesium: 2.1 mg/dL (ref 1.7–2.4)

## 2022-08-21 LAB — FOLATE: Folate: 13.2 ng/mL (ref >5.9)

## 2022-08-21 LAB — FERRITIN: Ferritin: 2 ng/mL — ABNORMAL LOW (ref 11–307)

## 2022-08-21 LAB — PREPARE RBC (CROSSMATCH)

## 2022-08-21 MED ORDER — ACETAMINOPHEN 650 MG RE SUPP: 650.0000 mg | Freq: Four times a day (QID) | RECTAL | Status: DC | PRN

## 2022-08-21 MED ORDER — SUCRALFATE 1 GM/10ML PO SUSP
1.0000 g | Freq: Three times a day (TID) | ORAL | Status: DC
  Administered 2022-08-21 – 2022-08-23 (×6): 1 g via ORAL
  Filled 2022-08-21 (×8): qty 10

## 2022-08-21 MED ORDER — POLYETHYLENE GLYCOL 3350 17 G PO PACK: 17.0000 g | PACK | Freq: Every day | ORAL | Status: DC | PRN

## 2022-08-21 MED ORDER — POTASSIUM CHLORIDE CRYS ER 20 MEQ PO TBCR
20.0000 meq | EXTENDED_RELEASE_TABLET | Freq: Once | ORAL | Status: AC
  Administered 2022-08-21: 20 meq via ORAL
  Filled 2022-08-21: qty 1

## 2022-08-21 MED ORDER — SODIUM CHLORIDE 0.9 % IV SOLN
10.0000 mL/h | Freq: Once | INTRAVENOUS | Status: AC
  Administered 2022-08-21: 10 mL/h via INTRAVENOUS

## 2022-08-21 MED ORDER — DOCUSATE SODIUM 100 MG PO CAPS: 100.0000 mg | ORAL_CAPSULE | Freq: Two times a day (BID) | ORAL | Status: DC | PRN

## 2022-08-21 MED ORDER — POTASSIUM CHLORIDE 10 MEQ/100ML IV SOLN
10.0000 meq | INTRAVENOUS | Status: AC
  Administered 2022-08-21 (×2): 10 meq via INTRAVENOUS
  Filled 2022-08-21: qty 100

## 2022-08-21 MED ORDER — PANTOPRAZOLE SODIUM 40 MG IV SOLR: 40.0000 mg | Freq: Two times a day (BID) | INTRAVENOUS | Status: DC

## 2022-08-21 MED ORDER — ACETAMINOPHEN 325 MG PO TABS
650.0000 mg | ORAL_TABLET | Freq: Four times a day (QID) | ORAL | Status: DC | PRN
  Administered 2022-08-22: 650 mg via ORAL
  Filled 2022-08-21: qty 2

## 2022-08-21 MED ORDER — PANTOPRAZOLE 80MG IVPB - SIMPLE MED
80.0000 mg | Freq: Once | INTRAVENOUS | Status: AC
  Administered 2022-08-21: 80 mg via INTRAVENOUS
  Filled 2022-08-21: qty 100

## 2022-08-21 MED ORDER — ONDANSETRON HCL 4 MG/2ML IJ SOLN: 4.0000 mg | Freq: Four times a day (QID) | INTRAMUSCULAR | Status: DC | PRN

## 2022-08-21 MED ORDER — ONDANSETRON HCL 4 MG PO TABS: 4.0000 mg | ORAL_TABLET | Freq: Four times a day (QID) | ORAL | Status: DC | PRN

## 2022-08-21 MED ORDER — LORAZEPAM 0.5 MG PO TABS: 0.5000 mg | ORAL_TABLET | Freq: Four times a day (QID) | ORAL | Status: DC | PRN

## 2022-08-21 MED ORDER — ALUM & MAG HYDROXIDE-SIMETH 200-200-20 MG/5ML PO SUSP
30.0000 mL | Freq: Once | ORAL | Status: AC
  Administered 2022-08-21: 30 mL via ORAL
  Filled 2022-08-21: qty 30

## 2022-08-21 MED ORDER — SENNOSIDES-DOCUSATE SODIUM 8.6-50 MG PO TABS: 1.0000 | ORAL_TABLET | Freq: Every evening | ORAL | Status: DC | PRN

## 2022-08-21 MED ORDER — PANTOPRAZOLE INFUSION (NEW) - SIMPLE MED
8.0000 mg/h | INTRAVENOUS | Status: DC
  Administered 2022-08-21 – 2022-08-22 (×2): 8 mg/h via INTRAVENOUS
  Filled 2022-08-21 (×2): qty 100

## 2022-08-21 NOTE — ED Provider Notes (Signed)
[facility] Provider Note    Event Date/Time   First MD Initiated Contact with Patient 08/21/22 1728     (approximate)   History   Generalized Body Aches   HPI  Oris D Benda is a 41 y.o. female  here with generalized body aches and abdominal pain. Pt reports that for the past 2-3 weeks she has had intermittent aching, burning epigastric abdominal pain. She has a h/o ulcers in the past but is not on a PPI. She's noticed sharp, burning pain w/ swallowing, epigastric discomfort, night sweats, and weight loss. She has had dark, tarry stools intermittently. Denies any nsaid use or aspirin intake. She has felt generally fatigued, weak.       Physical Exam   Triage Vital Signs: ED Triage Vitals  Enc Vitals Group     BP 08/21/22 1528 106/69     Pulse Rate 08/21/22 1528 93     Resp 08/21/22 1528 18     Temp 08/21/22 1528 98.9 F (37.2 C)     Temp Source 08/21/22 1528 Oral     SpO2 08/21/22 1528 100 %     Weight 08/21/22 1529 170 lb (77.1 kg)     Height 08/21/22 1529 '5\' 7"'$  (1.702 m)     Head Circumference --      Peak Flow --      Pain Score --      Pain Loc --      Pain Edu? --      Excl. in [place]? --     Most recent vital signs: Vitals:   08/22/22 0105 08/22/22 0137  BP: 120/79   Pulse: 82   Resp: 20   Temp: 98.5 F (36.9 C)   SpO2: 98% 98%     General: Awake, no distress. CV:  Good peripheral perfusion.  Resp:  Normal effort.  Abd:  No distention. Mild epigastric TTP without rebound or guarding.  Other:  No LE edema.   ED Results / Procedures / Treatments   Labs (all labs ordered are listed, but only abnormal results are displayed) Labs Reviewed  CBC WITH DIFFERENTIAL/PLATELET - Abnormal; Notable for the following components:      Result Value   RBC 3.81 (*)    Hemoglobin 6.1 (*)    HCT 23.2 (*)    MCV 60.9 (*)    MCH 16.0 (*)    MCHC 26.3 (*)    RDW 22.0 (*)    All other components within normal limits  BASIC METABOLIC  PANEL - Abnormal; Notable for the following components:   Potassium 3.2 (*)    Glucose, Bld 113 (*)    Calcium 8.8 (*)    All other components within normal limits  IRON AND TIBC - Abnormal; Notable for the following components:   Iron 19 (*)    TIBC 483 (*)    Saturation Ratios 4 (*)    All other components within normal limits  FERRITIN - Abnormal; Notable for the following components:   Ferritin 2 (*)    All other components within normal limits  RETICULOCYTES - Abnormal; Notable for the following components:   RBC. 3.78 (*)    All other components within normal limits  BASIC METABOLIC PANEL - Abnormal; Notable for the following components:   Calcium 8.6 (*)    All other components within normal limits  CBC - Abnormal; Notable for the following components:   Hemoglobin 7.3 (*)    HCT 26.3 (*)  MCV 64.5 (*)    MCH 17.9 (*)    MCHC 27.8 (*)    RDW 24.8 (*)    All other components within normal limits  RESP PANEL BY RT-PCR (FLU A&B, COVID) ARPGX2  SARS CORONAVIRUS 2 BY RT PCR  HEPATIC FUNCTION PANEL  VITAMIN B12  FOLATE  PROTIME-INR  MAGNESIUM  PATHOLOGIST SMEAR REVIEW  PREPARE RBC (CROSSMATCH)  TYPE AND SCREEN     EKG    RADIOLOGY CXR: no active disease   I also independently reviewed and agree with radiologist interpretations.   PROCEDURES:  Critical Care performed: Yes, see critical care procedure note(s)  .Critical Care  Performed by: Duffy Bruce, MD Authorized by: Duffy Bruce, MD   Critical care provider statement:    Critical care time (minutes):  30   Critical care was necessary to treat or prevent imminent or life-threatening deterioration of the following conditions:  Cardiac failure, circulatory failure and respiratory failure   Critical care was time spent personally by me on the following activities:  Development of treatment plan with patient or surrogate, discussions with consultants, evaluation of patient's response to treatment,  examination of patient, ordering and review of laboratory studies, ordering and review of radiographic studies, ordering and performing treatments and interventions, pulse oximetry, re-evaluation of patient's condition and review of old [place] ED: Medications  pantoprozole (PROTONIX) 80 mg /NS 100 mL infusion (8 mg/hr Intravenous New Bag/Given 08/21/22 1847)  pantoprazole (PROTONIX) injection 40 mg (has no administration in time range)  sucralfate (CARAFATE) 1 GM/10ML suspension 1 g (1 g Oral Given 08/21/22 2252)  acetaminophen (TYLENOL) tablet 650 mg (has no administration in time range)    Or  acetaminophen (TYLENOL) suppository 650 mg (has no administration in time range)  ondansetron (ZOFRAN) tablet 4 mg (has no administration in time range)    Or  ondansetron (ZOFRAN) injection 4 mg (has no administration in time range)  docusate sodium (COLACE) capsule 100 mg (has no administration in time range)  polyethylene glycol (MIRALAX / GLYCOLAX) packet 17 g (has no administration in time range)  LORazepam (ATIVAN) tablet 0.5 mg (has no administration in time range)  pantoprazole (PROTONIX) 80 mg /NS 100 mL IVPB (0 mg Intravenous Stopped 08/21/22 1907)  0.9 %  sodium chloride infusion (0 mL/hr Intravenous Stopped 08/21/22 1906)  alum & mag hydroxide-simeth (MAALOX/MYLANTA) 200-200-20 MG/5ML suspension 30 mL (30 mLs Oral Given 08/21/22 1805)  potassium chloride 10 mEq in 100 mL IVPB (0 mEq Intravenous Stopped 08/21/22 1906)  potassium chloride SA (KLOR-CON M) CR tablet 20 mEq (20 mEq Oral Given 08/21/22 1914)     IMPRESSION / MDM / [place] / ED COURSE  I reviewed the triage vital signs and the nursing notes.                               This patient presents to the ED for concern of abdominal discomfort, burning pain, this involves an extensive number of treatment options, and is a complaint that carries with it a high risk of complications and  morbidity.  The differential diagnosis includes: UGIB, gastritis, PUD, AVM, esophagitis, obstruction, lower gi bleed/diverticulosis   Co morbidities that complicate the patient evaluation  Anemia H/o fibroids GERD H/o ulcers in setting of NSAID use   Additional history obtained:  Additional history obtained from significant other External records from outside source obtained and reviewed including prior  EGD and records from 2017 fr NSAID-related gastritis/ulcers   Lab Tests:  I Ordered, and personally interpreted labs.  The pertinent results include:   CBC with Hgb 6.1 below last value 7.3 in 04/2022 Plt normal BMP with mild hypokalemia Normal renal function   Imaging Studies ordered:  I ordered imaging studies including CXR  I independently visualized and interpreted imaging which showed: Normal CXR I agree with the radiologist interpretation   Cardiac Monitoring: / EKG:  The patient was maintained on a cardiac monitor.  I personally viewed and interpreted the cardiac monitored which showed an underlying rhythm of: NSR  Problem List / ED Course / Critical interventions / Medication management  Epigastric pain, weakness LIkely UGIB with h/o same. Hgb <7 and pt symptomatic. No signs of significant active bleeding. H/o ulcers, not on PPI though pt states ulcers were from taking NSAIDs which she no longer takes IV PPI, admit, and transfuse 1u PRBC I ordered medication including protonix  for UGIB  Reevaluation of the patient after these medicines showed that the patient improved I have reviewed the patients home medicines and have made adjustments as needed    Test / Admission - Considered:  Admit   FINAL CLINICAL IMPRESSION(S) / ED DIAGNOSES   Final diagnoses:  UGIB (upper gastrointestinal bleed)  Symptomatic anemia     Rx / DC Orders   ED Discharge Orders     None        Note:  This document was prepared using Dragon voice recognition software  and may include unintentional dictation errors.   Duffy Bruce, MD 08/22/22 [phone]

## 2022-08-21 NOTE — H&P (Addendum)
History and Physical   Carolyn [name] NLG:[id number] DOB: 1981/09/12 DOA: 08/21/2022  PCP: Patient, No Pcp Per Patient coming from: Home  I have personally briefly reviewed patient's old medical records in [place].  Chief Concern: Mass, fatigue, epigastric pain  HPI: Carolyn [name] is a 41 year old female with history of stomach ulcers who presents emergency department for chief concerns of epigastric pain and intermittent aching with body aches.  She endorses poor p.o. intake and generalized weakness and fatigue.  Initial vitals in the emergency department showed temperature of 98.9, respiration rate of 18, heart rate of 93, blood pressure 106/69, SPO2 of 100% on room air.  Serum sodium is 137, potassium 3.2, chloride of 106, bicarb 24, BUN of 8, serum creatinine of 0.75, nonfasting blood glucose 113, eGFR greater than 60, WBC 5.8, hemoglobin 6.1, platelets of 260.  COVID/influenza A/influenza B PCR were negative.  ED treatment: Type and screen; 1 unit of PRBC ordered for transfusion; Protonix bolus and GGT.  At bedside, she is able to tell her name, age, current location, current year.   She reports two weeks of black stool.  She denies bright red blood per rectum.  She denies being on iron tablets. She denies NSAID use including BC powder, Goody powder, ibuprofen, Aleve, Motrin.  She infrequently consumes ETOH and she drinks 2 (16 oz beers) once per week. Her last drink was one week ago. She denies other etoh use.   She endorses epigastric abdominal pain with eating and drinking and this has been ongoing for the last 1 to 2 months.  She denies shortness of breath, dysuria, hematuria, diarrhea.  Social history: She lives with her daughter. She smokes 2 packs every two days. She denies recreational drug use. 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  ROS: Constitutional: no weight change, no fever ENT/Mouth: no sore throat, no rhinorrhea Eyes: no eye pain, no vision  changes Cardiovascular: no chest pain, no dyspnea,  no edema, no palpitations Respiratory: no cough, no sputum, no wheezing Gastrointestinal: no nausea, no vomiting, no diarrhea, no constipation Genitourinary: no urinary incontinence, no dysuria, no hematuria Musculoskeletal: no arthralgias, no myalgias Skin: no skin lesions, no pruritus, Neuro: + weakness, no loss of consciousness, no syncope Psych: no anxiety, no depression, + decrease appetite Heme/Lymph: no bruising, no bleeding  ED Course: Discussed with emergency medicine provider, patient requiring hospitalization for chief concerns of upper GI bleed.  Assessment/Plan  Principal Problem:   Symptomatic anemia Active Problems:   Iron deficiency anemia due to chronic blood loss   Generalized anxiety disorder   Major depressive disorder, single episode, moderate (HCC)   Acute blood loss anemia   History of gastric ulcer   History of GI bleed   Hypokalemia   Assessment and Plan:  * Symptomatic anemia - Presumed secondary to upper GI bleed - Patient has been having melena stool - Continue Protonix bolus and GGT - Agree with 1 unit of PRBC has been ordered - Goal hemoglobin is greater than 8 - Nursing order to ensure and maintain 2 PIV (preferred large-bore) for upper GI bleed - Gastroenterology has been consulted by myself, Dr. Alice Reichert is aware - Clear liquids at this time; n.p.o. after midnight - Admit to progressive cardiac, inpatient  Hypokalemia - Check magnesium, add to prior collection, discussed with lab - Status post potassium chloride 10 mill equivalent, 2 doses ordered per EDP - Added potassium chloride 20 mill equivalent p.o. one-time dose - Recheck BMP in  a.m.  Acute blood loss anemia - We will treat per above - Goal hemoglobin is greater than 8  Generalized anxiety disorder - Ativan 0.5 mg p.o. every 6 hours as needed for anxiety, 2 doses ordered  Iron deficiency anemia due to chronic blood loss -  Anemia panel ordered - EDP has ordered pathology to review blood smear and that is pending  Chart reviewed.   DVT prophylaxis: ted hose Code Status: full code Diet: clear liquid; NPO after midnight Family Communication: updated fiance  Disposition Plan: pending Consults called: gastroentologist Admission status: inpatient, PCU  Past Medical History:  Diagnosis Date   Anemia    Fibroids    GERD (gastroesophageal reflux disease)    Sickle cell trait ([place])    Past Surgical History:  Procedure Laterality Date   ESOPHAGOGASTRODUODENOSCOPY (EGD) WITH PROPOFOL N/A 02/26/2016   Procedure: ESOPHAGOGASTRODUODENOSCOPY (EGD) WITH PROPOFOL;  Surgeon: Lucilla Lame, MD;  Location: [facility] ENDOSCOPY;  Service: Endoscopy;  Laterality: N/A;   Social History:  reports that she has been smoking cigarettes. She has been smoking an average of 2 packs per day. She has never used smokeless tobacco. She reports current alcohol use. She reports that she does not currently use drugs after having used the following drugs: Marijuana.  No Known Allergies Family History  Problem Relation Age of Onset   Diabetes Mother    Hypertension Mother    Lung cancer Father    Leukemia Father    Lung cancer Maternal Grandmother    Colon cancer Maternal Grandfather    Family history: Family history reviewed and not pertinent.  Prior to Admission medications   Medication Sig Start Date End Date Taking? Authorizing Provider  docusate sodium (COLACE) 100 MG capsule Take 1 capsule (100 mg total) by mouth 2 (two) times daily. 01/19/22 01/19/23  Cuthriell, Charline Bills, PA-C  Ferrous Sulfate (IRON) 28 MG TABS Take 1 tablet (28 mg total) by mouth daily. 01/19/22   Cuthriell, Charline Bills, PA-C  loratadine (CLARITIN) 10 MG tablet Take 1 tablet (10 mg total) by mouth daily. 06/03/21 06/03/22  Fisher, Linden Dolin, PA-C  medroxyPROGESTERone (PROVERA) 10 MG tablet Take 1 tablet (10 mg total) by mouth daily. 04/06/22 05/06/22  Lucrezia Starch, MD   mometasone (NASONEX) 50 MCG/ACT nasal spray Place 2 sprays into the nose daily. 06/03/21 06/03/22  Fisher, Linden Dolin, PA-C  polyethylene glycol (MIRALAX) 17 g packet Take 17 g by mouth daily. 01/19/22   Cuthriell, Charline Bills, PA-C   Physical Exam: Vitals:   08/21/22 1528 08/21/22 1529 08/21/22 1915  BP: 106/69    Pulse: 93    Resp: 18    Temp: 98.9 F (37.2 C)  98.1 F (36.7 C)  TempSrc: Oral  Oral  SpO2: 100%    Weight:  77.1 kg   Height:  _0  (1.702 m)    Constitutional: appears age-appropriate, NAD, calm, comfortable Eyes: PERRL, lids and conjunctivae normal ENMT: Mucous membranes are moist. Posterior pharynx clear of any exudate or lesions. Age-appropriate dentition. Hearing appropriate Neck: normal, supple, no masses, no thyromegaly Respiratory: clear to auscultation bilaterally, no wheezing, no crackles. Normal respiratory effort. No accessory muscle use.  Cardiovascular: Regular rate and rhythm, no murmurs / rubs / gallops. No extremity edema. 2+ pedal pulses. No carotid bruits.  Abdomen: + Epigastric tenderness, no masses palpated, no hepatosplenomegaly. Bowel sounds positive.  Musculoskeletal: no clubbing / cyanosis. No joint deformity upper and lower extremities. Good ROM, no contractures, no atrophy. Normal muscle tone.  Skin:  no rashes, lesions, ulcers. No induration Neurologic: Sensation intact. Strength 5/5 in all 4.  Psychiatric: Normal judgment and insight. Alert and oriented x 3. Normal mood.   EKG: ordered  Chest x-ray on Admission: I personally reviewed and I agree with radiologist reading as below.  DG Chest 2 View  Result Date: 08/21/2022 CLINICAL DATA:  Flu-like symptoms EXAM: CHEST - 2 VIEW COMPARISON:  Chest x-ray 04/06/2022 FINDINGS: The heart size and mediastinal contours are within normal limits. Both lungs are clear. The visualized skeletal structures are unremarkable. IMPRESSION: No active cardiopulmonary disease. Electronically Signed   By: Ronney Asters  M.D.   On: 08/21/2022 17:16    Labs on Admission: I have personally reviewed following labs  CBC: Recent Labs  Lab 08/21/22 1533  WBC 5.8  NEUTROABS 4.4  HGB 6.1*  HCT 23.2*  MCV 60.9*  PLT 638   Basic Metabolic Panel: Recent Labs  Lab 08/21/22 1533 08/21/22 1745  NA 137  --   K 3.2*  --   CL 106  --   CO2 24  --   GLUCOSE 113*  --   BUN 8  --   CREATININE 0.75  --   CALCIUM 8.8*  --   MG  --  2.1   GFR: Estimated Creatinine Clearance: 101.1 mL/min (by C-G formula based on SCr of 0.75 mg/dL).  Anemia Panel: Recent Labs    08/21/22 1533  FOLATE 13.2  FERRITIN 2*  TIBC 483*  IRON 19*  RETICCTPCT 0.8   Urine analysis:    Component Value Date/Time   COLORURINE AMBER (A) 04/06/2022 1033   APPEARANCEUR CLOUDY (A) 04/06/2022 1033   LABSPEC 1.030 04/06/2022 1033   PHURINE 5.0 04/06/2022 1033   GLUCOSEU NEGATIVE 04/06/2022 1033   HGBUR LARGE (A) 04/06/2022 1033   BILIRUBINUR NEGATIVE 04/06/2022 1033   KETONESUR 5 (A) 04/06/2022 1033   PROTEINUR 30 (A) 04/06/2022 1033   NITRITE NEGATIVE 04/06/2022 1033   LEUKOCYTESUR TRACE (A) 04/06/2022 1033   CRITICAL CARE Performed by: Dr. Tobie Poet  Total critical care time: 35 minutes  Critical care time was exclusive of separately billable procedures and treating other patients.  Critical care was necessary to treat or prevent imminent or life-threatening deterioration.  Critical care was time spent personally by me on the following activities: development of treatment plan with patient and/or surrogate as well as nursing, discussions with consultants, evaluation of patient's response to treatment, examination of patient, obtaining history from patient or surrogate, ordering and performing treatments and interventions, ordering and review of laboratory studies, ordering and review of radiographic studies, pulse oximetry and re-evaluation of patient's condition.  Dr. Tobie Poet [facility]  If 7PM-7AM, please contact  overnight-coverage provider If 7AM-7PM, please contact day coverage provider [email or web address]  08/21/2022, 7:25 PM

## 2022-08-21 NOTE — Consult Note (Signed)
[facility] GI Inpatient Consult Note   Kathline Magic, M.D.  Reason for Consult: Epigastric pain, anemia, history of gastric ulcers   Attending Requesting Consult: Amy Cox, D.O.   History of Present Illness: Carolyn [name] is a 41 y.o. female with a history of anxiety disorder presenting with complaints of black stools and epigastric pain progressing over the last 2 weeks. Patient has a history of peptic ulcer disease dating back to May 2017 when she underwent an upper endoscopy by Dr. Lucilla Lame of [facility] revealing multiple cratered clean-based ulcers.  Patient claims she took no medication at home from that hospitalization and did not resume any proton pump inhibitor therapy after hospital release.  She takes no antacids at this time as she does not have usual problems with acid reflux.  She does complain of a dyne aphasia and chest pain when swallowing along with epigastric pain.  She has had black stools over the last 2 weeks but is not going frequently.  She still complains of constipated stool with only 1 bowel movement once or every other day.  She has chronic constipation.  She has a family history of colorectal cancer in her father according to her history.  Patient denies any weight loss.  She has had recurrent anorexia over the last month because of discomfort felt in the epigastrium after eating.  She has a history of gallstones. Patient carries a chronic diagnosis of multiple uterine fibroids with symptoms of menorrhagia leading to a chronic anemia with a hemoglobin between 7 and 9.5.  She reports having seen a gynecologist to treated her with hormone injections and pills which did not appear to slow down her menorrhagia to a significant amount.  Past Medical History:  Past Medical History:  Diagnosis Date   Anemia    Fibroids    GERD (gastroesophageal reflux disease)    Sickle cell trait ([place])     Problem List: Patient Active Problem List    Diagnosis Date Noted   Hypokalemia 08/21/2022   Fibroid 02/18/2021   Acute blood loss anemia 02/12/2021   History of gastric ulcer 02/12/2021   History of GI bleed 02/12/2021   Endometrial stripe increased 02/12/2021   Vaginal bleeding 02/12/2021   Intramural leiomyoma of uterus    Menorrhagia with regular cycle 03/20/2018   Generalized anxiety disorder 11/28/2016   Major depressive disorder, single episode, moderate ([place]) 11/28/2016   Iron deficiency anemia due to chronic blood loss    Gastric ulcer without hemorrhage or perforation but with obstruction    Duodenal ulceration    GI bleed due to NSAIDs    Symptomatic anemia 02/24/2016   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   Scheduled Inpatient Medications:    [START ON 08/25/2022] pantoprazole  40 mg Intravenous Q12H   sucralfate  1 g Oral TID WC & HS    Continuous Inpatient Infusions:    pantoprazole 8 mg/hr (08/21/22 1847)    PRN Inpatient Medications:  acetaminophen **OR** acetaminophen, docusate sodium, LORazepam, ondansetron **OR** ondansetron (ZOFRAN) IV, polyethylene glycol  Family History: family history includes  Colon cancer in her maternal grandfather; Diabetes in her mother; Hypertension in her mother; Leukemia in her father; Lung cancer in her father and maternal grandmother.   GI Family History: Negative  Social History:   reports that she has been smoking cigarettes. She has been smoking an average of 2 packs per day. She has never used smokeless tobacco.  She reports current alcohol use. She reports that she does not currently use drugs after having used the following drugs: Marijuana. The patient denies ETOH, tobacco, or drug use.    Review of Systems: Review of Systems - Negative except HPI  Physical Examination: BP 106/69 (BP Location: Left Arm)   Pulse 93   Temp 98.1 F (36.7 C) (Oral)   Resp 18   Ht '5\' 7"'$  (1.702 m)   Wt 77.1 kg   SpO2 100%   BMI 26.63 kg/m  Physical Exam Constitutional:      General: She is not in acute distress.    Appearance: Normal appearance. She is normal weight. She is not ill-appearing, toxic-appearing or diaphoretic.  HENT:     Head: Normocephalic and atraumatic.     Nose: Nose normal.     Mouth/Throat:     Mouth: Mucous membranes are moist.  Eyes:     General: No scleral icterus.    Extraocular Movements: Extraocular movements intact.     Conjunctiva/sclera: Conjunctivae normal.     Pupils: Pupils are equal, round, and reactive to light.  Cardiovascular:     Rate and Rhythm: Normal rate and regular rhythm.     Pulses: Normal pulses.  Pulmonary:     Effort: Pulmonary effort is normal.  Abdominal:     General: There is no distension.     Tenderness: There is no abdominal tenderness. There is no guarding.  Musculoskeletal:     Cervical back: Normal range of motion.  Skin:    General: Skin is warm and dry.     Capillary Refill: Capillary refill takes less than 2 seconds.  Neurological:     General: No focal deficit present.     Mental Status: She is alert.  Psychiatric:        Mood and Affect: Mood normal.        Judgment: Judgment normal.     Data: Lab Results  Component Value Date   WBC 5.8 08/21/2022   HGB 6.1 (L) 08/21/2022   HCT 23.2 (L) 08/21/2022   MCV 60.9 (L) 08/21/2022   PLT 260 08/21/2022   Recent Labs  Lab 08/21/22 1533  HGB 6.1*   Lab Results  Component Value Date   NA 137 08/21/2022   K 3.2 (L) 08/21/2022   CL 106 08/21/2022   CO2 24 08/21/2022   BUN 8  08/21/2022   CREATININE 0.75 08/21/2022   Lab Results  Component Value Date   ALT 11 08/21/2022   AST 21 08/21/2022   ALKPHOS 48 08/21/2022   BILITOT 0.8 08/21/2022   Recent Labs  Lab 08/21/22 1751  INR 1.1      Latest Ref Rng & Units 08/21/2022    3:33 PM 04/06/2022   10:33 AM 02/04/2022   12:00 PM  CBC  WBC 4.0 - 10.5 K/uL 5.8  5.7  8.3   Hemoglobin 12.0 - 15.0 g/dL 6.1  7.3  8.2   Hematocrit 36.0 - 46.0 % 23.2  26.5  28.9   Platelets 150 - 400 K/uL 260  304  318     STUDIES: DG Chest  2 View  Result Date: 08/21/2022 CLINICAL DATA:  Flu-like symptoms EXAM: CHEST - 2 VIEW COMPARISON:  Chest x-ray 04/06/2022 FINDINGS: The heart size and mediastinal contours are within normal limits. Both lungs are clear. The visualized skeletal structures are unremarkable. IMPRESSION: No active cardiopulmonary disease. Electronically Signed   By: Ronney Asters M.D.   On: 08/21/2022 17:16   '@IMAGES'$ @  Assessment:  Acute on chronic symptomatic anemia - Baseline Hgb between 7 and 9. Currently 6.1. GI and GYN sources of bleeding previously discovered: Multiple gastric ulcers and Multiple uterine fibroids on Ct and ultrasound of the pelvis. Chronic NSAID use. BC powder, ibuprofen, Aleve. Epigastric pain - Peptic ulcer disease most likely cause, though possibly biliary given known history of cholelithiasis. History of PUD with current c/o Melena. Hypokalemia - Supplementing per primary team. Sickle Cell trait. GERD Generalized Anxiety disorder.    Recommendations: Agree with Protonix IV gtt. Continue serial H/H, prbc transfusions. Hold oral iron for now. Correct hypokalemia. EGD when clinically feasible. Will follow.  Thank you for the consult. Please call with questions or concerns.  Olean Ree, "Lanny Hurst MD [facility] [place], [place] [address] [phone]  08/21/2022 8:37 PM

## 2022-08-21 NOTE — H&P (View-Only) (Signed)
[facility] GI Inpatient Consult Note   Kathline Magic, M.D.  Reason for Consult: Epigastric pain, anemia, history of gastric ulcers   Attending Requesting Consult: Amy Cox, D.O.   History of Present Illness: Carolyn [name] is a 41 y.o. female with a history of anxiety disorder presenting with complaints of black stools and epigastric pain progressing over the last 2 weeks. Patient has a history of peptic ulcer disease dating back to May 2017 when she underwent an upper endoscopy by Dr. Lucilla Lame of [facility] revealing multiple cratered clean-based ulcers.  Patient claims she took no medication at home from that hospitalization and did not resume any proton pump inhibitor therapy after hospital release.  She takes no antacids at this time as she does not have usual problems with acid reflux.  She does complain of a dyne aphasia and chest pain when swallowing along with epigastric pain.  She has had black stools over the last 2 weeks but is not going frequently.  She still complains of constipated stool with only 1 bowel movement once or every other day.  She has chronic constipation.  She has a family history of colorectal cancer in her father according to her history.  Patient denies any weight loss.  She has had recurrent anorexia over the last month because of discomfort felt in the epigastrium after eating.  She has a history of gallstones. Patient carries a chronic diagnosis of multiple uterine fibroids with symptoms of menorrhagia leading to a chronic anemia with a hemoglobin between 7 and 9.5.  She reports having seen a gynecologist to treated her with hormone injections and pills which did not appear to slow down her menorrhagia to a significant amount.  Past Medical History:  Past Medical History:  Diagnosis Date   Anemia    Fibroids    GERD (gastroesophageal reflux disease)    Sickle cell trait ([place])     Problem List: Patient Active Problem List    Diagnosis Date Noted   Hypokalemia 08/21/2022   Fibroid 02/18/2021   Acute blood loss anemia 02/12/2021   History of gastric ulcer 02/12/2021   History of GI bleed 02/12/2021   Endometrial stripe increased 02/12/2021   Vaginal bleeding 02/12/2021   Intramural leiomyoma of uterus    Menorrhagia with regular cycle 03/20/2018   Generalized anxiety disorder 11/28/2016   Major depressive disorder, single episode, 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   Iron deficiency anemia due to chronic blood loss    Gastric ulcer without hemorrhage or perforation but with obstruction    Duodenal ulceration    GI bleed due to NSAIDs    Symptomatic anemia 02/24/2016   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   Scheduled Inpatient Medications:    [START ON 08/25/2022] pantoprazole  40 mg Intravenous Q12H   sucralfate  1 g Oral TID WC & HS    Continuous Inpatient Infusions:    pantoprazole 8 mg/hr (08/21/22 1847)    PRN Inpatient Medications:  acetaminophen **OR** acetaminophen, docusate sodium, LORazepam, ondansetron **OR** ondansetron (ZOFRAN) IV, polyethylene glycol  Family History: family history includes  Colon cancer in her maternal grandfather; Diabetes in her mother; Hypertension in her mother; Leukemia in her father; Lung cancer in her father and maternal grandmother.   GI Family History: Negative  Social History:   reports that she has been smoking cigarettes. She has been smoking an average of 2 packs per day. She has never used smokeless tobacco.  She reports current alcohol use. She reports that she does not currently use drugs after having used the following drugs: Marijuana. The patient denies ETOH, tobacco, or drug use.    Review of Systems: Review of Systems - Negative except HPI  Physical Examination: BP 106/69 (BP Location: Left Arm)   Pulse 93   Temp 98.1 F (36.7 C) (Oral)   Resp 18   Ht '5\' 7"'$  (1.702 m)   Wt 77.1 kg   SpO2 100%   BMI 26.63 kg/m  Physical Exam Constitutional:      General: She is not in acute distress.    Appearance: Normal appearance. She is normal weight. She is not ill-appearing, toxic-appearing or diaphoretic.  HENT:     Head: Normocephalic and atraumatic.     Nose: Nose normal.     Mouth/Throat:     Mouth: Mucous membranes are moist.  Eyes:     General: No scleral icterus.    Extraocular Movements: Extraocular movements intact.     Conjunctiva/sclera: Conjunctivae normal.     Pupils: Pupils are equal, round, and reactive to light.  Cardiovascular:     Rate and Rhythm: Normal rate and regular rhythm.     Pulses: Normal pulses.  Pulmonary:     Effort: Pulmonary effort is normal.  Abdominal:     General: There is no distension.     Tenderness: There is no abdominal tenderness. There is no guarding.  Musculoskeletal:     Cervical back: Normal range of motion.  Skin:    General: Skin is warm and dry.     Capillary Refill: Capillary refill takes less than 2 seconds.  Neurological:     General: No focal deficit present.     Mental Status: She is alert.  Psychiatric:        Mood and Affect: Mood normal.        Judgment: Judgment normal.     Data: Lab Results  Component Value Date   WBC 5.8 08/21/2022   HGB 6.1 (L) 08/21/2022   HCT 23.2 (L) 08/21/2022   MCV 60.9 (L) 08/21/2022   PLT 260 08/21/2022   Recent Labs  Lab 08/21/22 1533  HGB 6.1*   Lab Results  Component Value Date   NA 137 08/21/2022   K 3.2 (L) 08/21/2022   CL 106 08/21/2022   CO2 24 08/21/2022   BUN 8  08/21/2022   CREATININE 0.75 08/21/2022   Lab Results  Component Value Date   ALT 11 08/21/2022   AST 21 08/21/2022   ALKPHOS 48 08/21/2022   BILITOT 0.8 08/21/2022   Recent Labs  Lab 08/21/22 1751  INR 1.1      Latest Ref Rng & Units 08/21/2022    3:33 PM 04/06/2022   10:33 AM 02/04/2022   12:00 PM  CBC  WBC 4.0 - 10.5 K/uL 5.8  5.7  8.3   Hemoglobin 12.0 - 15.0 g/dL 6.1  7.3  8.2   Hematocrit 36.0 - 46.0 % 23.2  26.5  28.9   Platelets 150 - 400 K/uL 260  304  318     STUDIES: DG Chest  2 View  Result Date: 08/21/2022 CLINICAL DATA:  Flu-like symptoms EXAM: CHEST - 2 VIEW COMPARISON:  Chest x-ray 04/06/2022 FINDINGS: The heart size and mediastinal contours are within normal limits. Both lungs are clear. The visualized skeletal structures are unremarkable. IMPRESSION: No active cardiopulmonary disease. Electronically Signed   By: Ronney Asters M.D.   On: 08/21/2022 17:16   '@IMAGES'$ @  Assessment:  Acute on chronic symptomatic anemia - Baseline Hgb between 7 and 9. Currently 6.1. GI and GYN sources of bleeding previously discovered: Multiple gastric ulcers and Multiple uterine fibroids on Ct and ultrasound of the pelvis. Chronic NSAID use. BC powder, ibuprofen, Aleve. Epigastric pain - Peptic ulcer disease most likely cause, though possibly biliary given known history of cholelithiasis. History of PUD with current c/o Melena. Hypokalemia - Supplementing per primary team. Sickle Cell trait. GERD Generalized Anxiety disorder.    Recommendations: Agree with Protonix IV gtt. Continue serial H/H, prbc transfusions. Hold oral iron for now. Correct hypokalemia. EGD when clinically feasible. Will follow.  Thank you for the consult. Please call with questions or concerns.  Olean Ree, "Lanny Hurst MD [facility] [place], [place] [address] [phone]  08/21/2022 8:37 PM

## 2022-08-21 NOTE — Assessment & Plan Note (Signed)
-   We will treat per above - Goal hemoglobin is greater than 8

## 2022-08-21 NOTE — Assessment & Plan Note (Signed)
-   Ativan 0.5 mg p.o. every 6 hours as needed for anxiety, 2 doses ordered

## 2022-08-21 NOTE — Hospital Course (Signed)
Ms. Carolyn [name] is a 41 year old female with history of stomach ulcers who presents emergency department for chief concerns of epigastric pain and intermittent aching with body aches.  She endorses poor p.o. intake and generalized weakness and fatigue.  Initial vitals in the emergency department showed temperature of 98.9, respiration rate of 18, heart rate of 93, blood pressure 106/69, SPO2 of 100% on room air.  Serum sodium is 137, potassium 3.2, chloride of 106, bicarb 24, BUN of 8, serum creatinine of 0.75, nonfasting blood glucose 113, eGFR greater than 60, WBC 5.8, hemoglobin 6.1, platelets of 260.  COVID/influenza A/influenza B PCR were negative.  ED treatment: Type and screen; 1 unit of PRBC ordered for transfusion; Protonix bolus and GGT.  08/22/2022.  Transfuse another unit of packed red blood cells for hemoglobin of 7.3.  We will give IV iron today.  Gastritis seen on EGD.  08/23/2022.  Hemoglobin stable 8.4.  Refer to hematology as outpatient to follow-up counts.  Refer to gastroenterology for outpatient colonoscopy.  Advised not to eat ice.

## 2022-08-21 NOTE — Assessment & Plan Note (Signed)
EGD showed gastritis.  GI recommending outpatient colonoscopy.  Will refer to hematology as outpatient.

## 2022-08-21 NOTE — Assessment & Plan Note (Addendum)
Transfuse another unit of packed red blood cells and hemoglobin of 7.3.  Also give IV Venofer today.

## 2022-08-21 NOTE — ED Triage Notes (Signed)
Pt states she has been nauseated for the past few days and has been unable to eat. No vomiting. Pt states that her stomach has been hurting. One episode of diarrhea but nothing else.

## 2022-08-21 NOTE — Assessment & Plan Note (Addendum)
Replaced. °

## 2022-08-22 ENCOUNTER — Other Ambulatory Visit: Payer: Self-pay

## 2022-08-22 ENCOUNTER — Inpatient Hospital Stay: Payer: 59 | Admitting: Registered Nurse

## 2022-08-22 ENCOUNTER — Encounter
Admission: EM | Disposition: A | Discharge status: Home / Self Care | Payer: Self-pay | Source: Home / Self Care | Attending: Emergency Medicine

## 2022-08-22 ENCOUNTER — Encounter: Payer: Self-pay | Admitting: Internal Medicine

## 2022-08-22 DIAGNOSIS — E876 Hypokalemia: Secondary | ICD-10-CM

## 2022-08-22 DIAGNOSIS — D649 Anemia, unspecified: Secondary | ICD-10-CM | POA: Diagnosis not present

## 2022-08-22 DIAGNOSIS — J449 Chronic obstructive pulmonary disease, unspecified: Secondary | ICD-10-CM | POA: Diagnosis not present

## 2022-08-22 DIAGNOSIS — D500 Iron deficiency anemia secondary to blood loss (chronic): Secondary | ICD-10-CM | POA: Diagnosis not present

## 2022-08-22 DIAGNOSIS — R69 Illness, unspecified: Secondary | ICD-10-CM | POA: Diagnosis not present

## 2022-08-22 DIAGNOSIS — E663 Overweight: Secondary | ICD-10-CM

## 2022-08-22 DIAGNOSIS — K2970 Gastritis, unspecified, without bleeding: Secondary | ICD-10-CM | POA: Diagnosis not present

## 2022-08-22 DIAGNOSIS — F411 Generalized anxiety disorder: Secondary | ICD-10-CM

## 2022-08-22 DIAGNOSIS — K449 Diaphragmatic hernia without obstruction or gangrene: Secondary | ICD-10-CM | POA: Diagnosis not present

## 2022-08-22 HISTORY — PX: ESOPHAGOGASTRODUODENOSCOPY (EGD) WITH PROPOFOL: SHX5813

## 2022-08-22 LAB — POC URINE PREG, ED: Preg Test, Ur: NEGATIVE

## 2022-08-22 LAB — HEMOGLOBIN: Hemoglobin: 8.5 g/dL — ABNORMAL LOW (ref 12.0–15.0)

## 2022-08-22 LAB — BASIC METABOLIC PANEL
Anion gap: 6 (ref 5–15)
BUN: 6 mg/dL (ref 6–20)
CO2: 25 mmol/L (ref 22–32)
Calcium: 8.6 mg/dL — ABNORMAL LOW (ref 8.9–10.3)
Chloride: 109 mmol/L (ref 98–111)
Creatinine, Ser: 0.77 mg/dL (ref 0.44–1.00)
GFR, Estimated: >60 mL/min (ref >60)
Glucose, Bld: 88 mg/dL (ref 70–99)
Potassium: 3.7 mmol/L (ref 3.5–5.1)
Sodium: 140 mmol/L (ref 135–145)

## 2022-08-22 LAB — CBC
HCT: 26.3 % — ABNORMAL LOW (ref 36.0–46.0)
Hemoglobin: 7.3 g/dL — ABNORMAL LOW (ref 12.0–15.0)
MCH: 17.9 pg — ABNORMAL LOW (ref 26.0–34.0)
MCHC: 27.8 g/dL — ABNORMAL LOW (ref 30.0–36.0)
MCV: 64.5 fL — ABNORMAL LOW (ref 80.0–100.0)
Platelets: 218 10*3/uL (ref 150–400)
RBC: 4.08 MIL/uL (ref 3.87–5.11)
RDW: 24.8 % — ABNORMAL HIGH (ref 11.5–15.5)
WBC: 4.4 10*3/uL (ref 4.0–10.5)
nRBC: 0 % (ref 0.0–0.2)

## 2022-08-22 LAB — HCG, QUANTITATIVE, PREGNANCY
hCG, Beta Chain, Quant, S: <1 m[IU]/mL (ref <5)
hCG, Beta Chain, Quant, S: <1 m[IU]/mL (ref <5)

## 2022-08-22 LAB — PREPARE RBC (CROSSMATCH)

## 2022-08-22 LAB — PATHOLOGIST SMEAR REVIEW

## 2022-08-22 SURGERY — ESOPHAGOGASTRODUODENOSCOPY (EGD) WITH PROPOFOL
Anesthesia: General

## 2022-08-22 MED ORDER — PANTOPRAZOLE SODIUM 40 MG PO TBEC
40.0000 mg | DELAYED_RELEASE_TABLET | Freq: Two times a day (BID) | ORAL | Status: DC
  Administered 2022-08-22 – 2022-08-23 (×2): 40 mg via ORAL
  Filled 2022-08-22 (×2): qty 1

## 2022-08-22 MED ORDER — SODIUM CHLORIDE 0.9% IV SOLUTION
Freq: Once | INTRAVENOUS | Status: AC
  Filled 2022-08-22: qty 250

## 2022-08-22 MED ORDER — PROPOFOL 500 MG/50ML IV EMUL
INTRAVENOUS | Status: DC | PRN
  Administered 2022-08-22: 200 ug/kg/min via INTRAVENOUS

## 2022-08-22 MED ORDER — SODIUM CHLORIDE 0.9 % IV SOLN
300.0000 mg | Freq: Once | INTRAVENOUS | Status: AC
  Administered 2022-08-22: 300 mg via INTRAVENOUS
  Filled 2022-08-22: qty 300

## 2022-08-22 MED ORDER — SODIUM CHLORIDE 0.9 % IV SOLN: INTRAVENOUS | Status: DC

## 2022-08-22 MED ORDER — ACETAMINOPHEN 325 MG PO TABS
650.0000 mg | ORAL_TABLET | Freq: Once | ORAL | Status: AC
  Administered 2022-08-22: 650 mg via ORAL
  Filled 2022-08-22: qty 2

## 2022-08-22 MED ORDER — PROPOFOL 10 MG/ML IV BOLUS
INTRAVENOUS | Status: DC | PRN
  Administered 2022-08-22: 100 mg via INTRAVENOUS

## 2022-08-22 MED ORDER — DEXMEDETOMIDINE HCL IN NACL 200 MCG/50ML IV SOLN
INTRAVENOUS | Status: DC | PRN
  Administered 2022-08-22: 12 ug via INTRAVENOUS

## 2022-08-22 MED ORDER — NICOTINE 21 MG/24HR TD PT24
21.0000 mg | MEDICATED_PATCH | Freq: Every day | TRANSDERMAL | Status: DC
  Administered 2022-08-22 – 2022-08-23 (×2): 21 mg via TRANSDERMAL
  Filled 2022-08-22 (×2): qty 1

## 2022-08-22 NOTE — Progress Notes (Signed)
  Progress Note   Patient: Carolyn [name] FSE:[id number] DOB: [birth year] DOA: 08/21/2022     1 DOS: the patient was seen and examined on 08/22/2022   Brief hospital course: Ms. Carolyn [name] is a 41 year old female with history of stomach ulcers who presents emergency department for chief concerns of epigastric pain and intermittent aching with body aches.  She endorses poor p.o. intake and generalized weakness and fatigue.  Initial vitals in the emergency department showed temperature of 98.9, respiration rate of 18, heart rate of 93, blood pressure 106/69, SPO2 of 100% on room air.  Serum sodium is 137, potassium 3.2, chloride of 106, bicarb 24, BUN of 8, serum creatinine of 0.75, nonfasting blood glucose 113, eGFR greater than 60, WBC 5.8, hemoglobin 6.1, platelets of 260.  COVID/influenza A/influenza B PCR were negative.  ED treatment: Type and screen; 1 unit of PRBC ordered for transfusion; Protonix bolus and GGT.  08/22/2022.  Transfuse another unit of packed red blood cells for hemoglobin of 7.3.  We will give IV iron today.  Gastritis seen on EGD  Assessment and Plan: * Symptomatic anemia Transfuse another unit of packed red blood cells and hemoglobin of 7.3.  Also give IV Venofer today.  Iron deficiency anemia due to chronic blood loss EGD showed gastritis.  GI recommending outpatient colonoscopy.  Will refer to hematology as outpatient.  Hypokalemia Replaced  Overweight (BMI 25.0-29.9) BMI 26.16  Generalized anxiety disorder - Ativan 0.5 mg p.o. every 6 hours as needed for anxiety, 2 doses ordered        Subjective: Patient feeling better after 1 unit of packed red blood cells.  Hemoglobin still low at 7.3 this morning and ordered another unit of blood.  Also ordered IV iron with her ferritin being 2.  Physical Exam: Vitals:   08/22/22 1325 08/22/22 1355 08/22/22 1448 08/22/22 1621  BP: 112/61 110/73 105/75 112/74  Pulse: 65  68 74  Resp: _0 Temp:   97.8 F (36.6 C) 98.1 F (36.7 C)  TempSrc:   Oral Oral  SpO2: 100% 100% 100% 100%  Weight:      Height:       Physical Exam Cardiovascular:     Rate and Rhythm: Normal rate and regular rhythm.     Heart sounds: Normal heart sounds, S1 normal and S2 normal.  Pulmonary:     Breath sounds: No decreased breath sounds, wheezing, rhonchi or rales.  Abdominal:     Palpations: Abdomen is soft.     Tenderness: There is no abdominal tenderness.  Musculoskeletal:     Right lower leg: No swelling.     Left lower leg: No swelling.  Skin:    General: Skin is warm.     Findings: No rash.  Neurological:     Mental Status: She is alert and oriented to person, place, and time.     Data Reviewed: Potassium 3.7, hemoglobin 7.3  Family Communication: Family in room when I was there but they were sleeping.  Disposition: Status is: Inpatient Remains inpatient appropriate because: Being treated for severe anemia.  Receiving blood and IV iron today.  Planned Discharge Destination: Home    Time spent: 28 minutes  Author: Loletha Grayer, MD 08/22/2022 4:46 PM  For on call review [email or web address].

## 2022-08-22 NOTE — ED Notes (Signed)
Report to Almyra Free, RN in endo.

## 2022-08-22 NOTE — Anesthesia Postprocedure Evaluation (Signed)
Anesthesia Post Note  Patient: Carolyn [name]  Procedure(s) Performed: ESOPHAGOGASTRODUODENOSCOPY (EGD) WITH PROPOFOL  Patient location during evaluation: PACU Anesthesia Type: General Level of consciousness: awake Pain management: pain level controlled Vital Signs Assessment: post-procedure vital signs reviewed and stable Respiratory status: nonlabored ventilation Cardiovascular status: stable Anesthetic complications: no  No notable events documented.   Last Vitals:  Vitals:   08/22/22 1355 08/22/22 1448  BP: 110/73 105/75  Pulse:  68  Resp: 14 18  Temp:  36.6 C  SpO2: 100% 100%    Last Pain:  Vitals:   08/22/22 1448  TempSrc: Oral  PainSc:                  [name],Meriel Kelliher

## 2022-08-22 NOTE — Op Note (Addendum)
[facility] Gastroenterology Patient Name: Carolyn [name] Procedure Date: 08/22/2022 12:27 PM MRN: [id number] Account #: [id number] Date of Birth: November 23, 1980 Admit Type: Inpatient Age: 41 Room: [facility] ENDO ROOM 4 Gender: Female Note Status: Supervisor Override Instrument Name: Altamese Cabal Endoscope [id number] Procedure:             Upper GI endoscopy Indications:           Iron deficiency anemia secondary to chronic blood loss Providers:             Lorie Apley K. Alice Reichert MD, MD Referring MD:          No Local Md, MD (Referring MD) Medicines:             Propofol per Anesthesia Complications:         No immediate complications. Procedure:             Pre-Anesthesia Assessment:                        - The risks and benefits of the procedure and the                         sedation options and risks were discussed with the                         patient. All questions were answered and informed                         consent was obtained.                        - Patient identification and proposed procedure were                         verified prior to the procedure by the nurse. The                         procedure was verified in the procedure room.                        - ASA Grade Assessment: II - A patient with mild                         systemic disease.                        - After reviewing the risks and benefits, the patient                         was deemed in satisfactory condition to undergo the                         procedure.                        After obtaining informed consent, the endoscope was                         passed under direct vision. Throughout the procedure,  the patient's blood pressure, pulse, and oxygen                         saturations were monitored continuously. The Endoscope                         was introduced through the mouth, and advanced to the                         third part of duodenum. The  upper GI endoscopy was                         accomplished without difficulty. The patient tolerated                         the procedure well. Findings:      The esophagus was normal.      Patchy mild inflammation characterized by erythema was found in the       gastric antrum.      A 2 cm hiatal hernia was present.      The examined duodenum was normal. Impression:            - Normal esophagus.                        - Gastritis.                        - 2 cm hiatal hernia.                        - Normal examined duodenum.                        - No specimens collected. Recommendation:        - Return patient to hospital ward for ongoing care.                        - Advance diet as tolerated.                        - Continue present medications.                        - Given other Non-GI sources of anemia (Fibroids,                         Sickle cell trait), would advise outpatient                         colonoscopy.                        GI sign off. Procedure Code(s):     --- Professional ---                        [phone], Esophagogastroduodenoscopy, flexible,                         transoral; diagnostic, including collection of  specimen(s) by brushing or washing, when performed                         (separate procedure) Diagnosis Code(s):     --- Professional ---                        D50.0, Iron deficiency anemia secondary to blood loss                         (chronic)                        K44.9, Diaphragmatic hernia without obstruction or                         gangrene                        K29.70, Gastritis, unspecified, without bleeding CPT copyright 2022 American Medical Association. All rights reserved. The codes documented in this report are preliminary and upon coder review may  be revised to meet current compliance requirements. Efrain Sella MD, MD 08/22/2022 12:59:47 PM This report has been signed  electronically. Number of Addenda: 0 Note Initiated On: 08/22/2022 12:27 PM Estimated Blood Loss:  Estimated blood loss: none.      [facility]

## 2022-08-22 NOTE — ED Notes (Signed)
Endo RN here for patient pick up - all belongings sent with patient.

## 2022-08-22 NOTE — Anesthesia Procedure Notes (Signed)
Date/Time: 08/22/2022 12:50 PM  Performed by: Doreen Salvage, CRNAPre-anesthesia Checklist: Patient identified, Emergency Drugs available, Suction available and Patient being monitored Patient Re-evaluated:Patient Re-evaluated prior to induction Oxygen Delivery Method: Nasal cannula Induction Type: IV induction Dental Injury: Teeth and Oropharynx as per pre-operative assessment  Comments: Nasal cannula with etCO2 monitoring

## 2022-08-22 NOTE — Progress Notes (Signed)
Admitted to 215 after report called to Santiago Glad. Joe patient's family aware and patient aware.

## 2022-08-22 NOTE — Anesthesia Preprocedure Evaluation (Signed)
Anesthesia Evaluation  Patient identified by MRN, date of birth, ID band Patient awake    Reviewed: Allergy & Precautions, NPO status , Patient's Chart, lab work & pertinent test results  Airway Mallampati: II  TM Distance: >3 FB Neck ROM: full    Dental  (+) Partial Upper, Poor Dentition   Pulmonary neg pulmonary ROS, COPD, Current Smoker   Pulmonary exam normal  + decreased breath sounds      Cardiovascular Exercise Tolerance: Good negative cardio ROS Normal cardiovascular exam Rhythm:Regular Rate:Normal     Neuro/Psych   Anxiety     negative neurological ROS  negative psych ROS   GI/Hepatic negative GI ROS, Neg liver ROS, PUD,GERD  Medicated,,  Endo/Other  negative endocrine ROS    Renal/GU negative Renal ROS  negative genitourinary   Musculoskeletal negative musculoskeletal ROS (+)    Abdominal Normal abdominal exam  (+)   Peds negative pediatric ROS (+)  Hematology negative hematology ROS (+) Blood dyscrasia, Sickle cell trait and anemia   Anesthesia Other Findings Past Medical History: No date: Anemia No date: Fibroids No date: GERD (gastroesophageal reflux disease) No date: Sickle cell trait ([facility])  Past Surgical History: 02/26/2016: ESOPHAGOGASTRODUODENOSCOPY (EGD) WITH PROPOFOL; N/A     Comment:  Procedure: ESOPHAGOGASTRODUODENOSCOPY (EGD) WITH               PROPOFOL;  Surgeon: Lucilla Lame, MD;  Location: [facility]               ENDOSCOPY;  Service: Endoscopy;  Laterality: N/A;  BMI    Body Mass Index: 26.16 kg/m      Reproductive/Obstetrics negative OB ROS                             Anesthesia Physical Anesthesia Plan  ASA: 2  Anesthesia Plan: General   Post-op Pain Management:    Induction: Intravenous  PONV Risk Score and Plan: Propofol infusion and TIVA  Airway Management Planned: Natural Airway  Additional Equipment:   Intra-op Plan:   Post-operative  Plan:   Informed Consent: I have reviewed the patients History and Physical, chart, labs and discussed the procedure including the risks, benefits and alternatives for the proposed anesthesia with the patient or authorized representative who has indicated his/her understanding and acceptance.     Dental Advisory Given  Plan Discussed with: CRNA and Surgeon  Anesthesia Plan Comments:        Anesthesia Quick Evaluation

## 2022-08-22 NOTE — Assessment & Plan Note (Signed)
BMI 26.16

## 2022-08-22 NOTE — Transfer of Care (Signed)
Immediate Anesthesia Transfer of Care Note  Patient: Carolyn [name]  Procedure(s) Performed: Procedure(s): ESOPHAGOGASTRODUODENOSCOPY (EGD) WITH PROPOFOL (N/A)  Patient Location: PACU and Endoscopy Unit  Anesthesia Type:General  Level of Consciousness: sedated  Airway & Oxygen Therapy: Patient Spontanous Breathing and Patient connected to nasal cannula oxygen  Post-op Assessment: Report given to RN and Post -op Vital signs reviewed and stable  Post vital signs: Reviewed and stable  Last Vitals:  Vitals:   08/22/22 1226 08/22/22 1255  BP: 117/81 113/75  Pulse: 73 78  Resp: 16 15  Temp: (!) 36.3 C (!) 35.8 C  SpO2: 419% 379%    Complications: No apparent anesthesia complications

## 2022-08-22 NOTE — Interval H&P Note (Signed)
History and Physical Interval Note:  08/22/2022 12:38 PM  Carolyn [name]  has presented today for surgery, with the diagnosis of Symptomatic anemia, epigastric abdominal pain.  The various methods of treatment have been discussed with the patient and family. After consideration of risks, benefits and other options for treatment, the patient has consented to  Procedure(s): ESOPHAGOGASTRODUODENOSCOPY (EGD) WITH PROPOFOL (N/A) as a surgical intervention.  The patient's history has been reviewed, patient examined, no change in status, stable for surgery.  I have reviewed the patient's chart and labs.  Questions were answered to the patient's satisfaction.     Abram, Wheeler

## 2022-08-23 ENCOUNTER — Encounter: Payer: Self-pay | Admitting: Internal Medicine

## 2022-08-23 DIAGNOSIS — R69 Illness, unspecified: Secondary | ICD-10-CM | POA: Diagnosis not present

## 2022-08-23 DIAGNOSIS — D500 Iron deficiency anemia secondary to blood loss (chronic): Secondary | ICD-10-CM | POA: Diagnosis not present

## 2022-08-23 DIAGNOSIS — D649 Anemia, unspecified: Secondary | ICD-10-CM | POA: Diagnosis not present

## 2022-08-23 DIAGNOSIS — F5089 Other specified eating disorder: Secondary | ICD-10-CM | POA: Diagnosis not present

## 2022-08-23 DIAGNOSIS — E876 Hypokalemia: Secondary | ICD-10-CM | POA: Diagnosis not present

## 2022-08-23 DIAGNOSIS — E663 Overweight: Secondary | ICD-10-CM | POA: Diagnosis not present

## 2022-08-23 LAB — CBC
HCT: 28.4 % — ABNORMAL LOW (ref 36.0–46.0)
Hemoglobin: 8.4 g/dL — ABNORMAL LOW (ref 12.0–15.0)
MCH: 20.2 pg — ABNORMAL LOW (ref 26.0–34.0)
MCHC: 29.6 g/dL — ABNORMAL LOW (ref 30.0–36.0)
MCV: 68.3 fL — ABNORMAL LOW (ref 80.0–100.0)
Platelets: 208 10*3/uL (ref 150–400)
RBC: 4.16 MIL/uL (ref 3.87–5.11)
RDW: 28.5 % — ABNORMAL HIGH (ref 11.5–15.5)
WBC: 6.4 10*3/uL (ref 4.0–10.5)
nRBC: 0 % (ref 0.0–0.2)

## 2022-08-23 LAB — TYPE AND SCREEN
ABO/RH(D): A POS
Antibody Screen: NEGATIVE
Unit division: 0
Unit division: 0

## 2022-08-23 LAB — BPAM RBC
Blood Product Expiration Date: 202312152359
Blood Product Expiration Date: 202312202359
ISSUE DATE / TIME: 202311192106
ISSUE DATE / TIME: 202311200912
Unit Type and Rh: 6200
Unit Type and Rh: 6200

## 2022-08-23 MED ORDER — INFLUENZA VAC SPLIT QUAD 0.5 ML IM SUSY: 0.5000 mL | PREFILLED_SYRINGE | INTRAMUSCULAR | Status: DC

## 2022-08-23 MED ORDER — FERROUS SULFATE 325 (65 FE) MG PO TABS: 325.0000 mg | ORAL_TABLET | Freq: Every day | ORAL | 0 refills | Status: DC

## 2022-08-23 MED ORDER — NICOTINE 21 MG/24HR TD PT24: MEDICATED_PATCH | TRANSDERMAL | 0 refills | Status: DC

## 2022-08-23 MED ORDER — PANTOPRAZOLE SODIUM 40 MG PO TBEC: 40.0000 mg | DELAYED_RELEASE_TABLET | Freq: Every day | ORAL | 0 refills | Status: DC

## 2022-08-23 MED ORDER — INFLUENZA VAC SPLIT QUAD 0.5 ML IM SUSY
0.5000 mL | PREFILLED_SYRINGE | INTRAMUSCULAR | Status: AC
  Administered 2022-08-23: 0.5 mL via INTRAMUSCULAR
  Filled 2022-08-23: qty 0.5

## 2022-08-23 NOTE — TOC Initial Note (Signed)
Transition of Care [facility]) - Initial/Assessment Note    Patient Details  Name: Carolyn [name] MRN: [id number] Date of Birth: [birth year]  Transition of Care [facility]) CM/SW Contact:    Beverly Sessions, RN Phone Number: 08/23/2022, 10:50 AM  Clinical Narrative:                   Transition of Care (TOC) Screening Note   Patient Details  Name: Carolyn [name] Date of Birth: 1981/02/07   Transition of Care [facility]) CM/SW Contact:    Beverly Sessions, RN Phone Number: 08/23/2022, 10:50 AM    Transition of Care Department [facility]) has reviewed patient and no TOC needs have been identified at this time. We will continue to monitor patient advancement through interdisciplinary progression rounds. If new patient transition needs arise, please place a TOC consult.  List of local accepting PCPs added to AVS       Patient Goals and CMS Choice        Expected Discharge Plan and Services           Expected Discharge Date: 08/23/22                                    Prior Living Arrangements/Services                       Activities of Daily Living Home Assistive Devices/Equipment: None ADL Screening (condition at time of admission) Patient's cognitive ability adequate to safely complete daily activities?: Yes Is the patient deaf or have difficulty hearing?: No Does the patient have difficulty seeing, even when wearing glasses/contacts?: No Does the patient have difficulty concentrating, remembering, or making decisions?: No Patient able to express need for assistance with ADLs?: Yes Does the patient have difficulty dressing or bathing?: No Independently performs ADLs?: Yes (appropriate for developmental age) Does the patient have difficulty walking or climbing stairs?: No Weakness of Legs: None Weakness of Arms/Hands: None  Permission Sought/Granted                  Emotional Assessment              Admission diagnosis:  UGIB (upper  gastrointestinal bleed) [K92.2] Symptomatic anemia [D64.9] Patient Active Problem List   Diagnosis Date Noted   Hypokalemia 08/21/2022   Fibroid 02/18/2021   Acute blood loss anemia 02/12/2021   History of gastric ulcer 02/12/2021   History of GI bleed 02/12/2021   Endometrial stripe increased 02/12/2021   Vaginal bleeding 02/12/2021   Intramural leiomyoma of uterus    Menorrhagia with regular cycle 03/20/2018   Generalized anxiety disorder 11/28/2016   Major depressive disorder, single episode, moderate ([place]) 11/28/2016   Iron deficiency anemia due to chronic blood loss    Gastric ulcer without hemorrhage or perforation but with obstruction    Duodenal ulceration    GI bleed due to NSAIDs    Symptomatic anemia 02/24/2016   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 ([address]), [place] [address] [place]) [place] [address] Phone: [phone] Fax: [place] [address] Phone: [phone] Fax: [phone]     Social Determinants of Health (SDOH) Interventions    Readmission Risk Interventions  No data to display

## 2022-08-23 NOTE — Assessment & Plan Note (Signed)
Advised not to eat ice.

## 2022-08-23 NOTE — Discharge Instructions (Addendum)
But over the counter nexium or omeprazole and take at night for 30 days (take protonix prescribed during the day)   Some PCP options in [place] area- not a comprehensive list  [facility] [place]- [place]- [phone] [facility]- [place]- [place]- [phone]  or [place] Physician Referral Line [phone]

## 2022-08-23 NOTE — Discharge Summary (Signed)
Physician Discharge Summary   Patient: Carolyn [name] MRN: [id number] DOB: 08-Aug-1981  Admit date:     08/21/2022  Discharge date: 08/23/22  Discharge Physician: Loletha Grayer   PCP: Patient, No Pcp Per   Recommendations at discharge:   Refer to PCP Follow-up with Dr. Alice Reichert gastroenterology for outpatient colonoscopy Follow-up with hematology for anemia follow-up  Discharge Diagnoses: Principal Problem:   Symptomatic anemia Active Problems:   Iron deficiency anemia due to chronic blood loss   Hypokalemia   Pica   Overweight (BMI 25.0-29.9)   Acute blood loss anemia   History of gastric ulcer   History of GI bleed   Hospital Course: Carolyn [name] is a 41 year old female with history of stomach ulcers who presents emergency department for chief concerns of epigastric pain and intermittent aching with body aches.  She endorses poor p.o. intake and generalized weakness and fatigue.  Initial vitals in the emergency department showed temperature of 98.9, respiration rate of 18, heart rate of 93, blood pressure 106/69, SPO2 of 100% on room air.  Serum sodium is 137, potassium 3.2, chloride of 106, bicarb 24, BUN of 8, serum creatinine of 0.75, nonfasting blood glucose 113, eGFR greater than 60, WBC 5.8, hemoglobin 6.1, platelets of 260.  COVID/influenza A/influenza B PCR were negative.  ED treatment: Type and screen; 1 unit of PRBC ordered for transfusion; Protonix bolus and GGT.  08/22/2022.  Transfuse another unit of packed red blood cells for hemoglobin of 7.3.  We will give IV iron today.  Gastritis seen on EGD.  08/23/2022.  Hemoglobin stable 8.4.  Refer to hematology as outpatient to follow-up counts.  Refer to gastroenterology for outpatient colonoscopy.  Advised not to eat ice.  Assessment and Plan: * Symptomatic anemia Transfuse 2 units of packed red blood cells and also given IV iron during the hospital course.  Hemoglobin upon discharge 8.4.  Refer to  hematology and gastroenterology as outpatient.  Iron deficiency anemia due to chronic blood loss EGD showed gastritis.  GI recommending outpatient colonoscopy.  Will refer to hematology as outpatient.  Protonix given while here.  I prescribed this once a day.  Advised her to take over-the-counter omeprazole or Nexium at night for 1 month to have twice daily PPI for 1 month.  Hypokalemia Replaced  Pica Advised not to eat ice.  Overweight (BMI 25.0-29.9) BMI 26.16         Consultants: Gastroenterology Procedures performed: EGD Disposition: Home Diet recommendation:  Regular diet DISCHARGE MEDICATION: Allergies as of 08/23/2022   No Known Allergies      Medication List     TAKE these medications    docusate sodium 100 MG capsule Commonly known as: Colace Take 1 capsule (100 mg total) by mouth 2 (two) times daily.   ferrous sulfate 325 (65 FE) MG tablet Take 1 tablet (325 mg total) by mouth daily. What changed:  medication strength how much to take   nicotine 21 mg/24hr patch Commonly known as: NICODERM CQ - dosed in mg/24 hours One patch chest wall daily (okay to substitute generic)   pantoprazole 40 MG tablet Commonly known as: PROTONIX Take 1 tablet (40 mg total) by mouth daily.        Follow-up Information     Earlie Server, MD. Go in 3 week(s).   Specialty: Oncology Why: 09/13/22 at 10:45 AM iron deficiency anemia Contact information: [address] [place] [address] [place], [place],  MD. Go in 2 week(s).   Specialty: Gastroenterology Why: Next available appointment is 12/07/2021 9:30 office will call with sooner appointment after nurse evaluates. Contact information: [place] [address] [phone]         Lavera Guise, MD. Go in 2 week(s).   Specialties: Internal Medicine, Cardiology Why: 08/30/22 9:40 AM Hospital medical follow up Contact information: [address] [place]  [place] [address] [phone]                Discharge Exam: [place] Weights   08/21/22 1529 08/22/22 1226  Weight: 77.1 kg 75.8 kg   Physical Exam HENT:     Head: Normocephalic.     Mouth/Throat:     Pharynx: No oropharyngeal exudate.  Eyes:     General: Lids are normal.     Conjunctiva/sclera: Conjunctivae normal.  Cardiovascular:     Rate and Rhythm: Normal rate and regular rhythm.     Heart sounds: Normal heart sounds, S1 normal and S2 normal.  Pulmonary:     Breath sounds: No decreased breath sounds, wheezing, rhonchi or rales.  Abdominal:     Palpations: Abdomen is soft.     Tenderness: There is no abdominal tenderness.  Musculoskeletal:     Right lower leg: No swelling.     Left lower leg: No swelling.  Skin:    General: Skin is warm.     Findings: No rash.  Neurological:     Mental Status: She is alert and oriented to person, place, and time.      Condition at discharge: stable  The results of significant diagnostics from this hospitalization (including imaging, microbiology, ancillary and laboratory) are listed below for reference.   Imaging Studies: DG Chest 2 View  Result Date: 08/21/2022 CLINICAL DATA:  Flu-like symptoms EXAM: CHEST - 2 VIEW COMPARISON:  Chest x-ray 04/06/2022 FINDINGS: The heart size and mediastinal contours are within normal limits. Both lungs are clear. The visualized skeletal structures are unremarkable. IMPRESSION: No active cardiopulmonary disease. Electronically Signed   By: Ronney Asters M.D.   On: 08/21/2022 17:16    Microbiology: Results for orders placed or performed during the hospital encounter of 08/21/22  Resp Panel by RT-PCR (Flu A&B, Covid) Anterior Nasal Swab     Status: None   Collection Time: 08/21/22  3:33 PM   Specimen: Anterior Nasal Swab  Result Value Ref Range Status   SARS Coronavirus 2 by RT PCR NEGATIVE NEGATIVE Final    Comment: (NOTE) SARS-CoV-2 target nucleic acids are NOT DETECTED.  The SARS-CoV-2 RNA  is generally detectable in upper respiratory specimens during the acute phase of infection. The lowest concentration of SARS-CoV-2 viral copies this assay can detect is 138 copies/mL. A negative result does not preclude SARS-Cov-2 infection and should not be used as the sole basis for treatment or other patient management decisions. A negative result may occur with  improper specimen collection/handling, submission of specimen other than nasopharyngeal swab, presence of viral mutation(s) within the areas targeted by this assay, and inadequate number of viral copies(<138 copies/mL). A negative result must be combined with clinical observations, patient history, and epidemiological information. The expected result is Negative.  Fact Sheet for Patients:  EntrepreneurPulse.com.au  Fact Sheet for Healthcare Providers:  IncredibleEmployment.be  This test is no t yet approved or cleared by the [place] FDA and  has been authorized for detection and/or diagnosis of SARS-CoV-2 by FDA under an Emergency Use Authorization (EUA). This EUA will remain  in effect (meaning this test can be used) for the duration of the COVID-19 declaration under Section 564(b)(1) of the Act, 21 U.S.C.section 360bbb-3(b)(1), unless the authorization is terminated  or revoked sooner.       Influenza A by PCR NEGATIVE NEGATIVE Final   Influenza B by PCR NEGATIVE NEGATIVE Final    Comment: (NOTE) The Xpert Xpress SARS-CoV-2/FLU/RSV plus assay is intended as an aid in the diagnosis of influenza from Nasopharyngeal swab specimens and should not be used as a sole basis for treatment. Nasal washings and aspirates are unacceptable for Xpert Xpress SARS-CoV-2/FLU/RSV testing.  Fact Sheet for Patients: EntrepreneurPulse.com.au  Fact Sheet for Healthcare Providers: IncredibleEmployment.be  This test is not yet approved or cleared by the  [place] FDA and has been authorized for detection and/or diagnosis of SARS-CoV-2 by FDA under an Emergency Use Authorization (EUA). This EUA will remain in effect (meaning this test can be used) for the duration of the COVID-19 declaration under Section 564(b)(1) of the Act, 21 U.S.C. section 360bbb-3(b)(1), unless the authorization is terminated or revoked.  Performed at [facility], [address] [place], [place] [address]   SARS Coronavirus 2 by RT PCR (hospital order, performed in [facility] hospital lab) *cepheid single result test* Anterior Nasal Swab     Status: None   Collection Time: 08/21/22  5:51 PM   Specimen: Anterior Nasal Swab  Result Value Ref Range Status   SARS Coronavirus 2 by RT PCR NEGATIVE NEGATIVE Final    Comment: (NOTE) SARS-CoV-2 target nucleic acids are NOT DETECTED.  The SARS-CoV-2 RNA is generally detectable in upper and lower respiratory specimens during the acute phase of infection. The lowest concentration of SARS-CoV-2 viral copies this assay can detect is 250 copies / mL. A negative result does not preclude SARS-CoV-2 infection and should not be used as the sole basis for treatment or other patient management decisions.  A negative result may occur with improper specimen collection / handling, submission of specimen other than nasopharyngeal swab, presence of viral mutation(s) within the areas targeted by this assay, and inadequate number of viral copies (<250 copies / mL). A negative result must be combined with clinical observations, patient history, and epidemiological information.  Fact Sheet for Patients:   https://www.patel.info/  Fact Sheet for Healthcare Providers: https://hall.com/  This test is not yet approved or  cleared by the [place] FDA and has been authorized for detection and/or diagnosis of SARS-CoV-2 by FDA under an Emergency Use Authorization (EUA).  This  EUA will remain in effect (meaning this test can be used) for the duration of the COVID-19 declaration under Section 564(b)(1) of the Act, 21 U.S.C. section 360bbb-3(b)(1), unless the authorization is terminated or revoked sooner.  Performed at [facility], [address] [place], [place] [address]     Labs: CBC: Recent Labs  Lab 08/21/22 1533 08/22/22 0215 08/22/22 1725 08/23/22 0510  WBC 5.8 4.4  --  6.4  NEUTROABS 4.4  --   --   --   HGB 6.1* 7.3* 8.5* 8.4*  HCT 23.2* 26.3*  --  28.4*  MCV 60.9* 64.5*  --  68.3*  PLT 260 218  --  440   Basic Metabolic Panel: Recent Labs  Lab 08/21/22 1533 08/21/22 1745 08/22/22 0215  NA 137  --  140  K 3.2*  --  3.7  CL 106  --  109  CO2 24  --  25  GLUCOSE 113*  --  88  BUN 8  --  6  CREATININE 0.75  --  0.77  CALCIUM 8.8*  --  8.6*  MG  --  2.1  --    Liver Function Tests: Recent Labs  Lab 08/21/22 1533  AST 21  ALT 11  ALKPHOS 48  BILITOT 0.8  PROT 7.7  ALBUMIN 3.9   CBG: No results for input(s): "GLUCAP" in the last 168 hours.  Discharge time spent: greater than 30 minutes.  Signed: Loletha Grayer, MD [facility] 08/23/2022

## 2022-08-30 ENCOUNTER — Ambulatory Visit: Payer: 59 | Admitting: Internal Medicine

## 2022-09-02 ENCOUNTER — Encounter: Payer: Self-pay | Admitting: Oncology

## 2022-09-02 ENCOUNTER — Telehealth: Payer: Self-pay | Admitting: Internal Medicine

## 2022-09-02 NOTE — Telephone Encounter (Signed)
Patient called to r/s her 08/30/22 missed new appointment. I explained to her since she was a no show, we cannot put her back on schedule-Toni

## 2022-09-06 ENCOUNTER — Ambulatory Visit: Payer: Medicaid Other

## 2022-09-13 ENCOUNTER — Inpatient Hospital Stay: Payer: 59 | Admitting: Oncology

## 2022-09-13 ENCOUNTER — Telehealth: Payer: Self-pay | Admitting: Oncology

## 2022-09-13 NOTE — Telephone Encounter (Signed)
This patient called and said to cancel her 230 pm Hosp. f/u today, 09/13/22. She will call back to reschedule.

## 2022-09-16 ENCOUNTER — Inpatient Hospital Stay: Payer: 59 | Attending: Oncology | Admitting: Nurse Practitioner

## 2022-09-19 ENCOUNTER — Ambulatory Visit: Payer: Medicaid Other

## 2022-10-05 ENCOUNTER — Ambulatory Visit (LOCAL_COMMUNITY_HEALTH_CENTER): Payer: 59 | Admitting: Family Medicine

## 2022-10-05 ENCOUNTER — Ambulatory Visit: Payer: 59

## 2022-10-05 ENCOUNTER — Encounter: Payer: Self-pay | Admitting: Family Medicine

## 2022-10-05 VITALS — BP 132/84 | Ht 67.0 in | Wt 167.0 lb

## 2022-10-05 DIAGNOSIS — Z01419 Encounter for gynecological examination (general) (routine) without abnormal findings: Secondary | ICD-10-CM | POA: Diagnosis not present

## 2022-10-05 DIAGNOSIS — Z3009 Encounter for other general counseling and advice on contraception: Secondary | ICD-10-CM

## 2022-10-05 DIAGNOSIS — Z309 Encounter for contraceptive management, unspecified: Secondary | ICD-10-CM | POA: Diagnosis not present

## 2022-10-05 DIAGNOSIS — A6000 Herpesviral infection of urogenital system, unspecified: Secondary | ICD-10-CM

## 2022-10-05 DIAGNOSIS — A630 Anogenital (venereal) warts: Secondary | ICD-10-CM

## 2022-10-05 DIAGNOSIS — B9689 Other specified bacterial agents as the cause of diseases classified elsewhere: Secondary | ICD-10-CM

## 2022-10-05 DIAGNOSIS — Z113 Encounter for screening for infections with a predominantly sexual mode of transmission: Secondary | ICD-10-CM

## 2022-10-05 LAB — HM HIV SCREENING LAB: HM HIV Screening: NEGATIVE

## 2022-10-05 LAB — WET PREP FOR TRICH, YEAST, CLUE
Trichomonas Exam: NEGATIVE
Yeast Exam: NEGATIVE

## 2022-10-05 MED ORDER — METRONIDAZOLE 500 MG PO TABS: 500.0000 mg | ORAL_TABLET | Freq: Two times a day (BID) | ORAL | 0 refills | Status: AC

## 2022-10-05 NOTE — Progress Notes (Signed)
[facility] Number: [phone]  Family Planning Visit- Repeat Yearly Visit  Subjective:  Carolyn [name] is a 42 y.o. G1P1001  being seen today for an annual wellness visit and to discuss contraception options.   The patient is currently using No Method - No Contraceptive Precautions for pregnancy prevention. Patient does want a pregnancy in the next year.   Patient has the following medical problems: has Iron deficiency anemia due to chronic blood loss; Duodenal ulceration; Menorrhagia with regular cycle; Genital HSV; Overweight (BMI 25.0-29.9); History of gastric ulcer; History of GI bleed; Endometrial stripe increased; Intramural leiomyoma of uterus; Fibroid; and Hypokalemia on their problem list.  Chief Complaint  Patient presents with   Contraception    Physical std screening, patient complains of having a bump on her vagina and vaginal itching     Patient reports to clinic for PE, STI testing. She states that she has a "bump" on her vagina, and it has been there for 4 days. Endorses itching, has a hx of genital HSV.  See flowsheet for other program required questions.   Body mass index is 26.16 kg/m. - Patient is eligible for diabetes screening based on BMI> 25 and age >35?  yes HA1C ordered? yes  Patient reports 1 of partners in last year. Desires STI screening?  Yes   Has patient been screened once for HCV in the past?  No  No results found for: "HCVAB"  Does the patient have current of drug use, have a partner with drug use, and/or has been incarcerated since last result? No  If yes-- Screen for HCV through [facility]   Does the patient meet criteria for HBV testing? No  Criteria:  -Household, sexual or needle sharing contact with HBV -History of drug use -HIV positive -Those with known Hep C   Health Maintenance Due  Topic Date Due   COVID-19 Vaccine (2 - Moderna risk series)  03/16/2021    Review of Systems  Constitutional:  Negative for weight loss.  Eyes:  Negative for blurred vision.  Respiratory:  Negative for cough and shortness of breath.   Cardiovascular:  Negative for claudication.  Gastrointestinal:  Negative for nausea.  Genitourinary:  Positive for frequency. Negative for dysuria.  Musculoskeletal:  Positive for joint pain.  Skin:  Negative for rash.  Neurological:  Negative for headaches.  Endo/Heme/Allergies:  Does not bruise/bleed easily.    The following portions of the patient's history were reviewed and updated as appropriate: allergies, current medications, past family history, past medical history, past social history, past surgical history and problem list. Problem list updated.  Objective:   Vitals:   10/05/22 0909  BP: 132/84  Weight: 167 lb (75.8 kg)  Height: '5\' 7"'$  (1.702 m)    Physical Exam Vitals and nursing note reviewed.  Constitutional:      Appearance: Normal appearance.  HENT:     Head: Normocephalic and atraumatic.     Mouth/Throat:     Mouth: Mucous membranes are moist.     Pharynx: Oropharynx is clear. No oropharyngeal exudate or posterior oropharyngeal erythema.  Pulmonary:     Effort: Pulmonary effort is normal.  Abdominal:     General: Abdomen is flat.     Palpations: There is no mass.     Tenderness: There is no abdominal tenderness. There is no rebound.  Genitourinary:    Exam position: Lithotomy position.     Pubic Area:  No rash or pubic lice.      Tanner stage (genital): 5.     Labia:        Right: Lesion present. No rash.        Left: Lesion present. No rash.      Vagina: Vaginal discharge present. No erythema, bleeding or lesions.     Cervix: No cervical motion tenderness, discharge, friability, lesion or erythema.     Uterus: Normal.      Adnexa: Right adnexa normal and left adnexa normal.     Rectum: Normal.       Comments: pH = 4  Left lesion on labia> right lesion  Left lesion- about  0.75cm diameter Right lesion- about 3 mm diameter  Both cauliflower like in appearance  Mild amount of white thick discharge Lymphadenopathy:     Head:     Right side of head: No preauricular or posterior auricular adenopathy.     Left side of head: No preauricular or posterior auricular adenopathy.     Cervical: No cervical adenopathy.     Upper Body:     Right upper body: No supraclavicular, axillary or epitrochlear adenopathy.     Left upper body: No supraclavicular, axillary or epitrochlear adenopathy.     Lower Body: No right inguinal adenopathy. No left inguinal adenopathy.  Skin:    General: Skin is warm and dry.     Findings: No rash.  Neurological:     Mental Status: She is alert and oriented to person, place, and time.       Assessment and Plan:  Carolyn [name] is a 42 y.o. female [place] presenting to the [facility] Department for an yearly wellness and contraception visit   Contraception counseling: Reviewed options based on patient desire and reproductive life plan. Patient is interested in No Method - No Contraceptive Precautions. Patient desires pregnancy Risks, benefits, and typical effectiveness rates were reviewed.  Questions were answered.  Written information was also given to the patient to review.    The patient will follow up in  1 years for surveillance.  The patient was told to call with any further questions, or with any concerns about this method of contraception.  Emphasized use of condoms 100% of the time for STI prevention.  Patient was assessed for need for ECP. Not indicated- trying to get pregnant.  1. Well woman exam with routine gynecological exam Pap test not due til 02/19/2024 CBE today- normal- discussed that patient is eligible for mammogram starting at age 37. Encouraged to get a PCP who can refer her for mammogram.  -Pt c/o knee pain- encouraged PCP visit -endorses nocturia and increased frequency- will do A1c today  -  Hemoglobin A1c - Ambulatory referral to Behavioral Health  2. Family planning Patient desires pregnancy.  3. Screening for venereal disease -patient has a "bump" on her labia- it has been there x4 days -endorses vaginal discharge  - WET PREP FOR [place], YEAST, [place] - HIV [place] [facility] - Syphilis Serology, [place] [facility] - [facility]  4. 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 See picture above. Patient states this is her first time with genital warts that she is aware of. Cryotherapy used in 3x 10 sec freeze thaw cycles.  Counseled patient to return in 10-14 days for repeat cryotherapy.  5. Genital herpes simplex, unspecified site No evidence of current outbreak.  6. Bacterial vaginosis  -  metroNIDAZOLE (FLAGYL) 500 MG tablet; Take 1 tablet (500 mg total) by mouth 2 (two) times daily for 7 days.  Dispense: 14 tablet; Refill: 0  Return if symptoms worsen or fail to improve.  Future Appointments  Date Time Provider [place]  10/20/2022  9:00 AM Harlin Heys, MD [facility] None    [place], [place]

## 2022-10-05 NOTE — Progress Notes (Signed)
Pt treated for BV.  The patient was dispensed Metronidazole today. I provided counseling today regarding the medication. We discussed the medication, the side effects and when to call clinic. Patient given the opportunity to ask questions. Questions answered.  Windle Guard, RN

## 2022-10-06 LAB — HEMOGLOBIN A1C
Est. average glucose Bld gHb Est-mCnc: 82 mg/dL
Hgb A1c MFr Bld: 4.5 % — ABNORMAL LOW (ref 4.8–5.6)

## 2022-10-14 ENCOUNTER — Encounter: Payer: Self-pay | Admitting: Oncology

## 2022-10-18 ENCOUNTER — Telehealth: Payer: Self-pay | Admitting: Family Medicine

## 2022-10-18 NOTE — Telephone Encounter (Signed)
Patient states that she is returning a phone call that she missed from last week regarding her test results. Please call.

## 2022-10-19 ENCOUNTER — Ambulatory Visit: Payer: 59

## 2022-10-20 ENCOUNTER — Encounter: Payer: 59 | Admitting: Obstetrics and Gynecology

## 2022-10-20 DIAGNOSIS — D219 Benign neoplasm of connective and other soft tissue, unspecified: Secondary | ICD-10-CM

## 2022-11-09 ENCOUNTER — Ambulatory Visit: Payer: 59 | Admitting: Licensed Clinical Social Worker

## 2022-11-22 ENCOUNTER — Encounter: Payer: Medicaid Other | Admitting: Obstetrics and Gynecology

## 2022-11-22 DIAGNOSIS — D219 Benign neoplasm of connective and other soft tissue, unspecified: Secondary | ICD-10-CM

## 2022-11-22 DIAGNOSIS — Z7689 Persons encountering health services in other specified circumstances: Secondary | ICD-10-CM

## 2022-11-30 ENCOUNTER — Ambulatory Visit: Payer: 59 | Admitting: Licensed Clinical Social Worker

## 2023-02-01 IMAGING — CT CT RENAL STONE PROTOCOL
3 of 4 series · 8 of 46 positions shown, 15 images · non-contrast
Comparison: 01/02/2022

CLINICAL DATA: Left flank pain



[Series 4: lung bases · axial · 0.66mm/px · z∈[-205,-160]mm · 4 of 16 slices shown, 9 images]
[im 4/16  soft-tissue]
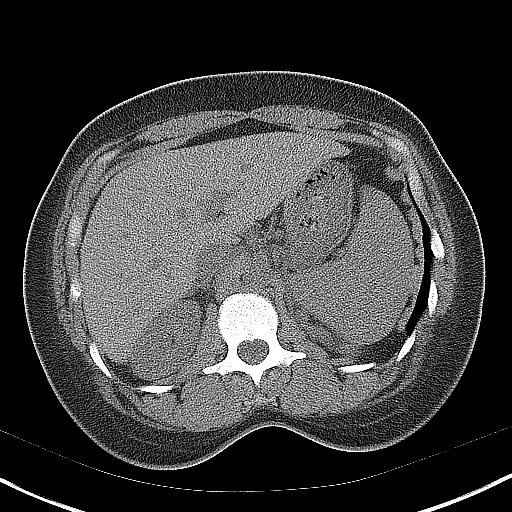
[im 4/16  lung]
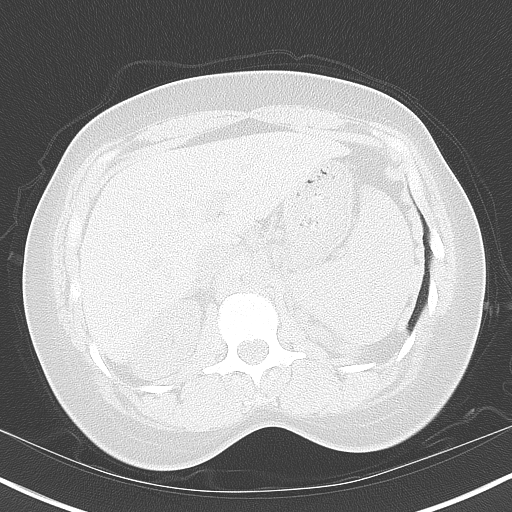
[im 4/16  bone]
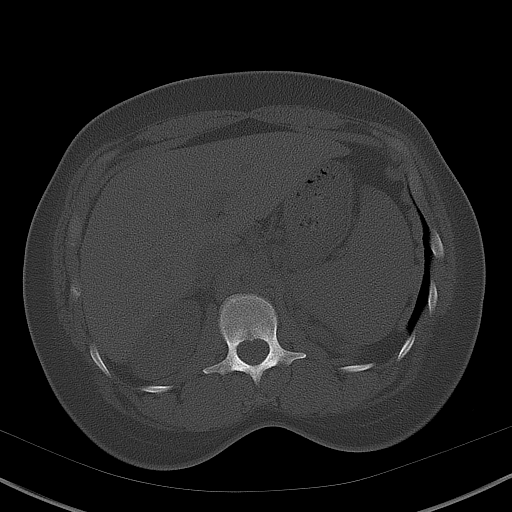
[im 7/16  soft-tissue]
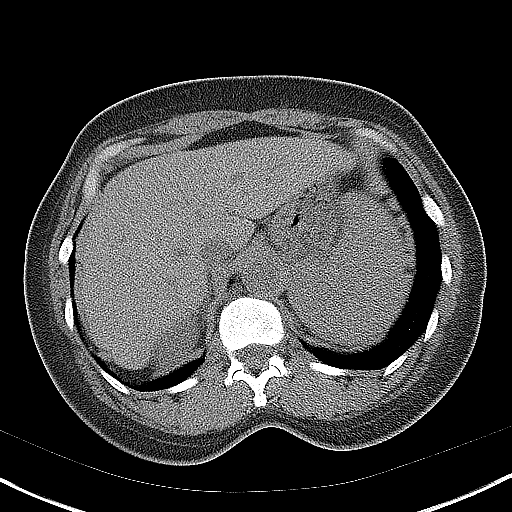
[im 7/16  lung]
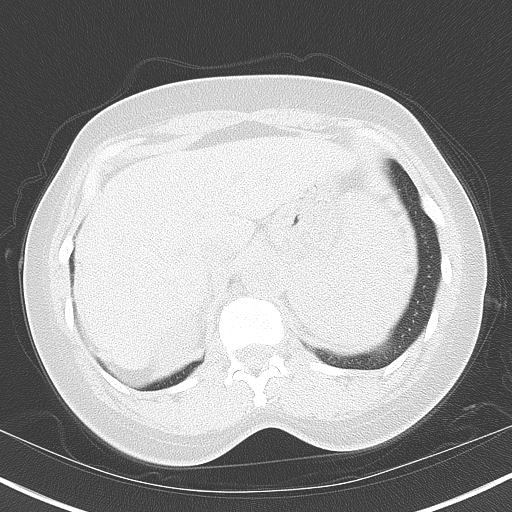
[im 10/16  soft-tissue]
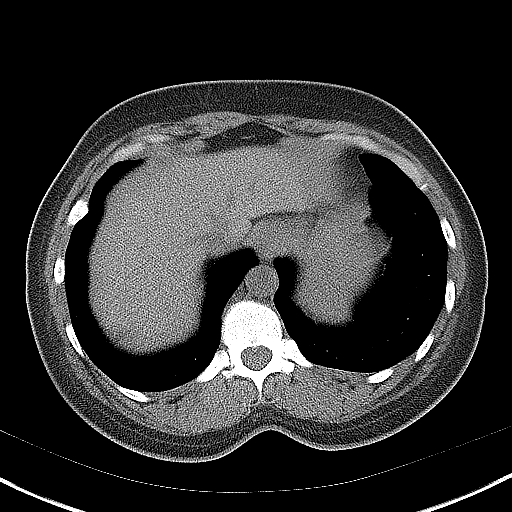
[im 10/16  lung]
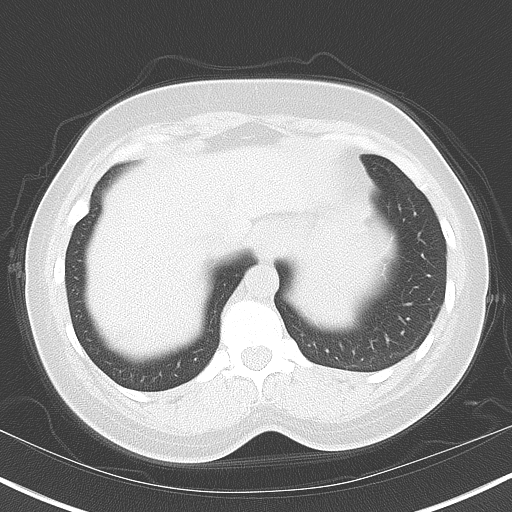
[im 13/16  soft-tissue]
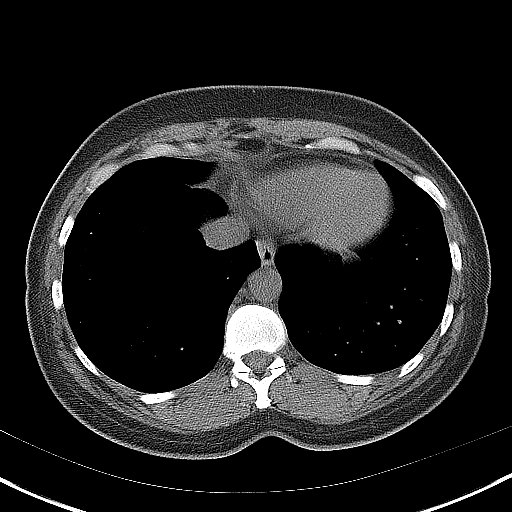
[im 13/16  lung]
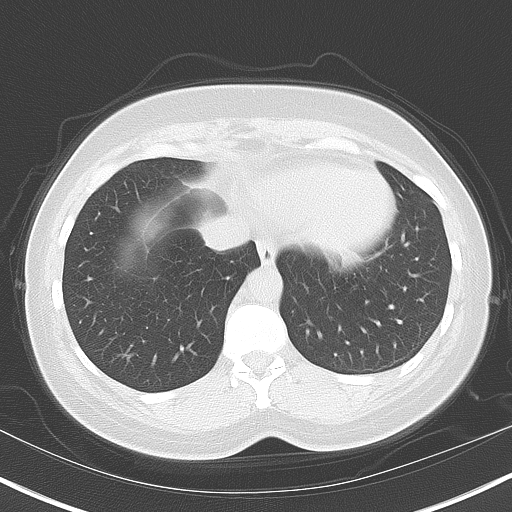

[Series 5: coronal · coronal · 0.65mm/px · 3 of 127 slices shown, 4 images]
[im 43/127  soft-tissue]
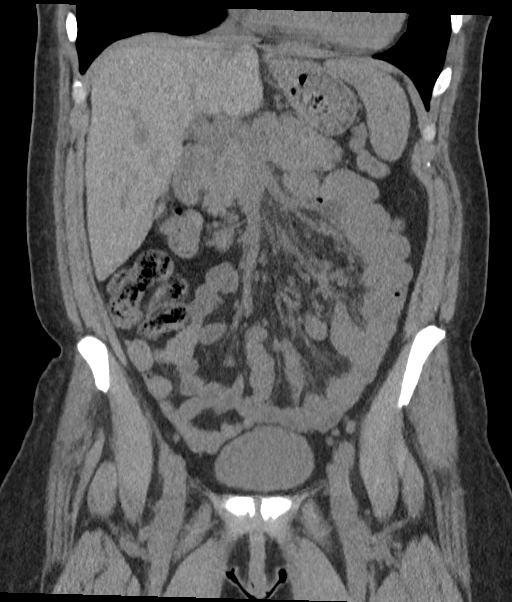
[im 57/127  soft-tissue]
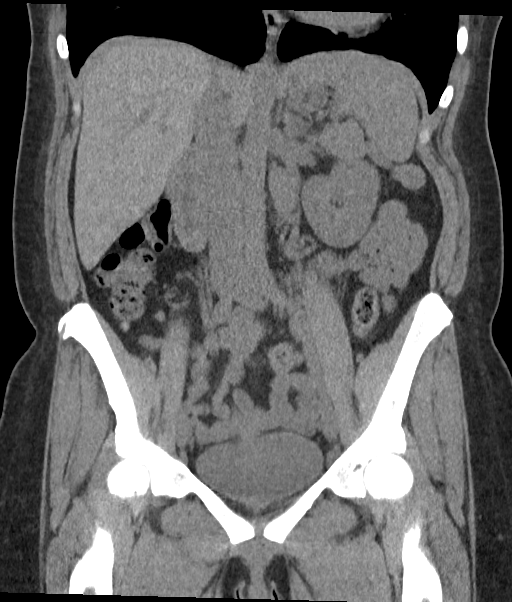
[im 57/127  bone]
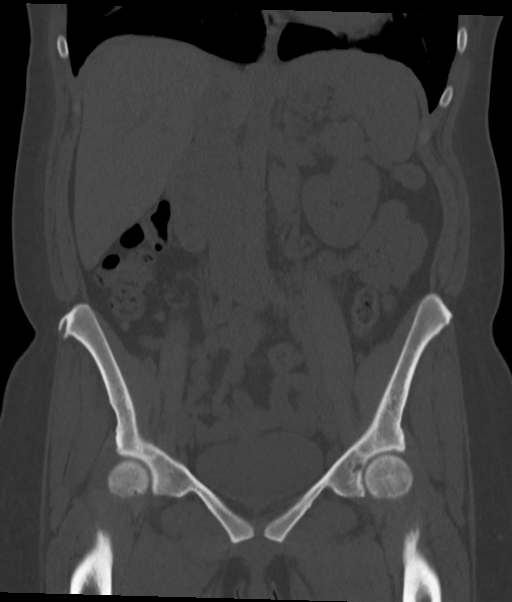
[im 71/127  soft-tissue]
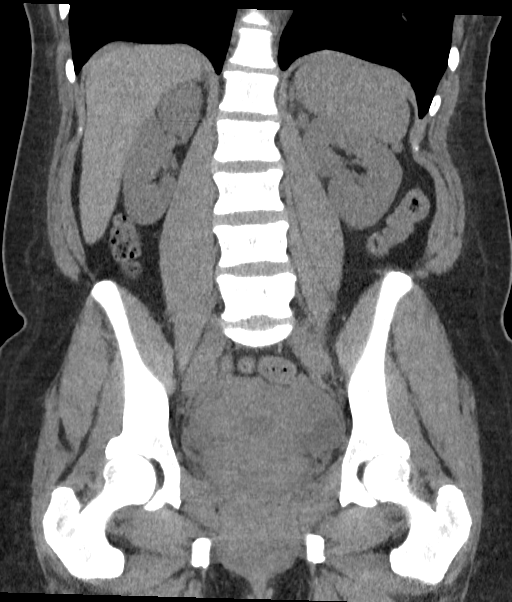

[Series 6: sagittal · sagittal · 0.56mm/px · 1 of 169 slices shown, 2 images]
[im 57/169  soft-tissue]
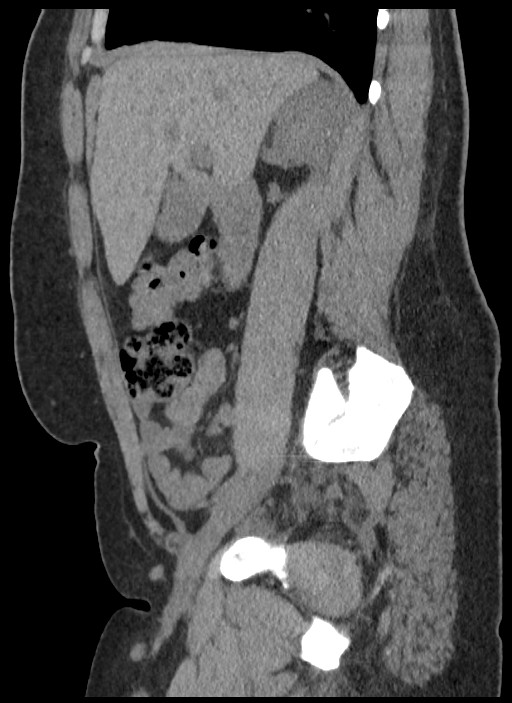
[im 57/169  bone]
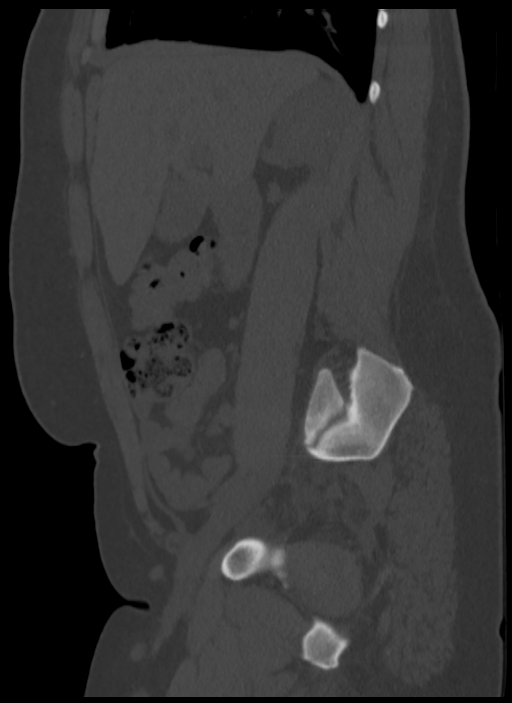

[8 of 46 positions shown; findings below may reference images not displayed]

FINDINGS: Lower chest: No focal pulmonary opacity. No pleural effusion. No
pericardial effusion.

Hepatobiliary: No focal liver abnormality is seen. Cholelithiasis.
No pericholecystic fluid, gallbladder wall thickening, or biliary
dilatation.

Pancreas: Unremarkable. No pancreatic ductal dilatation or
surrounding inflammatory changes.

Spleen: Normal in size without focal abnormality.

Adrenals/Urinary Tract: The adrenal glands are unremarkable. The
kidneys are symmetric in size. No nephrolithiasis. The visualized
course of the ureters is unremarkable. The bladder is normal for
degree of distension. No stone is seen within the bladder or
visualized ureters.

Stomach/Bowel: Stomach is within normal limits. Appendix appears
normal. No evidence of bowel wall thickening, distention, or
inflammatory changes.

Vascular/Lymphatic: No significant vascular findings are present. No
enlarged abdominal or pelvic lymph nodes.

Reproductive: Uterus and bilateral adnexa are unremarkable.

Other: Small fat containing umbilical hernia. No free fluid or free
air.

Musculoskeletal: Dextrocurvature of the thoracolumbar junction. No
acute osseous abnormality.
IMPRESSION: 1. No renal or ureteral stones. No etiology is seen for the
patient's flank pain.
2. Cholelithiasis without evidence of cholecystitis.

## 2023-03-07 ENCOUNTER — Encounter: Payer: Self-pay | Admitting: Oncology

## 2023-04-04 ENCOUNTER — Ambulatory Visit: Payer: 59

## 2023-04-16 DIAGNOSIS — S0191XA Laceration without foreign body of unspecified part of head, initial encounter: Secondary | ICD-10-CM | POA: Diagnosis not present

## 2023-04-16 DIAGNOSIS — F17210 Nicotine dependence, cigarettes, uncomplicated: Secondary | ICD-10-CM | POA: Diagnosis not present

## 2023-04-16 DIAGNOSIS — S0990XA Unspecified injury of head, initial encounter: Secondary | ICD-10-CM | POA: Diagnosis not present

## 2023-07-19 ENCOUNTER — Encounter: Payer: Self-pay | Admitting: Oncology

## 2023-07-19 ENCOUNTER — Emergency Department: Payer: 59

## 2023-07-19 ENCOUNTER — Other Ambulatory Visit: Payer: Self-pay

## 2023-07-19 ENCOUNTER — Emergency Department
Admission: EM | Admit: 2023-07-19 | Discharge: 2023-07-19 | Disposition: A | Discharge status: Home / Self Care | Payer: 59 | Attending: Emergency Medicine | Admitting: Emergency Medicine

## 2023-07-19 DIAGNOSIS — R1030 Lower abdominal pain, unspecified: Secondary | ICD-10-CM | POA: Diagnosis not present

## 2023-07-19 DIAGNOSIS — R9431 Abnormal electrocardiogram [ECG] [EKG]: Secondary | ICD-10-CM | POA: Diagnosis not present

## 2023-07-19 DIAGNOSIS — D649 Anemia, unspecified: Secondary | ICD-10-CM

## 2023-07-19 DIAGNOSIS — N920 Excessive and frequent menstruation with regular cycle: Secondary | ICD-10-CM

## 2023-07-19 DIAGNOSIS — K2970 Gastritis, unspecified, without bleeding: Secondary | ICD-10-CM | POA: Diagnosis not present

## 2023-07-19 LAB — COMPREHENSIVE METABOLIC PANEL
ALT: 10 U/L (ref 0–44)
AST: 13 U/L — ABNORMAL LOW (ref 15–41)
Albumin: 3.9 g/dL (ref 3.5–5.0)
Alkaline Phosphatase: 44 U/L (ref 38–126)
Anion gap: 6 (ref 5–15)
BUN: 14 mg/dL (ref 6–20)
CO2: 23 mmol/L (ref 22–32)
Calcium: 8.7 mg/dL — ABNORMAL LOW (ref 8.9–10.3)
Chloride: 108 mmol/L (ref 98–111)
Creatinine, Ser: 0.65 mg/dL (ref 0.44–1.00)
GFR, Estimated: >60 mL/min (ref >60)
Glucose, Bld: 108 mg/dL — ABNORMAL HIGH (ref 70–99)
Potassium: 3.6 mmol/L (ref 3.5–5.1)
Sodium: 137 mmol/L (ref 135–145)
Total Bilirubin: 0.2 mg/dL — ABNORMAL LOW (ref 0.3–1.2)
Total Protein: 7.7 g/dL (ref 6.5–8.1)

## 2023-07-19 LAB — CBC WITH DIFFERENTIAL/PLATELET
Abs Immature Granulocytes: 0.04 10*3/uL (ref 0.00–0.07)
Basophils Absolute: 0 10*3/uL (ref 0.0–0.1)
Basophils Relative: 0 %
Eosinophils Absolute: 0 10*3/uL (ref 0.0–0.5)
Eosinophils Relative: 1 %
HCT: 25.4 % — ABNORMAL LOW (ref 36.0–46.0)
Hemoglobin: 6.8 g/dL — ABNORMAL LOW (ref 12.0–15.0)
Immature Granulocytes: 1 %
Lymphocytes Relative: 29 %
Lymphs Abs: 2.5 10*3/uL (ref 0.7–4.0)
MCH: 17.7 pg — ABNORMAL LOW (ref 26.0–34.0)
MCHC: 26.8 g/dL — ABNORMAL LOW (ref 30.0–36.0)
MCV: 66 fL — ABNORMAL LOW (ref 80.0–100.0)
Monocytes Absolute: 0.5 10*3/uL (ref 0.1–1.0)
Monocytes Relative: 5 %
Neutro Abs: 5.5 10*3/uL (ref 1.7–7.7)
Neutrophils Relative %: 64 %
Platelets: 269 10*3/uL (ref 150–400)
RBC: 3.85 MIL/uL — ABNORMAL LOW (ref 3.87–5.11)
RDW: 22.9 % — ABNORMAL HIGH (ref 11.5–15.5)
Smear Review: NORMAL
WBC: 8.6 10*3/uL (ref 4.0–10.5)
nRBC: 0 % (ref 0.0–0.2)

## 2023-07-19 LAB — URINALYSIS, ROUTINE W REFLEX MICROSCOPIC
Bilirubin Urine: NEGATIVE
Glucose, UA: NEGATIVE mg/dL
Hgb urine dipstick: NEGATIVE
Ketones, ur: NEGATIVE mg/dL
Nitrite: NEGATIVE
Protein, ur: NEGATIVE mg/dL
Specific Gravity, Urine: 1.024 (ref 1.005–1.030)
pH: 6 (ref 5.0–8.0)

## 2023-07-19 LAB — TROPONIN I (HIGH SENSITIVITY): Troponin I (High Sensitivity): <2 ng/L (ref <18)

## 2023-07-19 LAB — LIPASE, BLOOD: Lipase: 44 U/L (ref 11–51)

## 2023-07-19 LAB — PREPARE RBC (CROSSMATCH)

## 2023-07-19 LAB — POC URINE PREG, ED: Preg Test, Ur: NEGATIVE

## 2023-07-19 MED ORDER — MORPHINE SULFATE (PF) 4 MG/ML IV SOLN
4.0000 mg | Freq: Once | INTRAVENOUS | Status: AC
  Administered 2023-07-19: 4 mg via INTRAVENOUS
  Filled 2023-07-19: qty 1

## 2023-07-19 MED ORDER — PANTOPRAZOLE SODIUM 40 MG PO TBEC: 40.0000 mg | DELAYED_RELEASE_TABLET | Freq: Every day | ORAL | 1 refills | Status: DC

## 2023-07-19 MED ORDER — IOHEXOL 300 MG/ML  SOLN
100.0000 mL | Freq: Once | INTRAMUSCULAR | Status: AC | PRN
  Administered 2023-07-19: 100 mL via INTRAVENOUS

## 2023-07-19 MED ORDER — FERROUS SULFATE 325 (65 FE) MG PO TBEC
325.0000 mg | DELAYED_RELEASE_TABLET | Freq: Two times a day (BID) | ORAL | 3 refills | Status: DC
Start: 2023-07-19 — End: 2023-11-24

## 2023-07-19 MED ORDER — SUCRALFATE 1 G PO TABS: 1.0000 g | ORAL_TABLET | Freq: Four times a day (QID) | ORAL | 0 refills | Status: DC

## 2023-07-19 MED ORDER — SODIUM CHLORIDE 0.9 % IV SOLN
10.0000 mL/h | Freq: Once | INTRAVENOUS | Status: AC
  Administered 2023-07-19: 10 mL/h via INTRAVENOUS

## 2023-07-19 MED ORDER — ONDANSETRON HCL 4 MG/2ML IJ SOLN
4.0000 mg | Freq: Once | INTRAMUSCULAR | Status: AC
  Administered 2023-07-19: 4 mg via INTRAVENOUS
  Filled 2023-07-19: qty 2

## 2023-07-19 NOTE — ED Notes (Signed)
Consult with Cyril Loosen, MD after reviewing transfusion order with a 1 hr duration time and per MD ok to transfuse is 1hr.

## 2023-07-19 NOTE — ED Provider Notes (Signed)
[facility] Provider Note    Event Date/Time   First MD Initiated Contact with Patient 07/19/23 1302     (approximate)  History   Chief Complaint: Abdominal Pain  HPI  Carolyn [name] is a 42 y.o. female with a past medical history of fibroids, heavy menstrual cycles, anemia, iron deficiency, presents to the emergency department for abdominal pain.  According to the patient she has a history of gastritis has had multiple endoscopies in the past for the same.  She states over the past 3 weeks she has been experiencing a burning sensation in the upper abdomen into the lower chest.  She is also been experiencing small pain to the lower abdomen as well.  States nausea but denies any vomiting.  Does state constipation.  No urinary symptoms.  No fever.  Physical Exam   Triage Vital Signs: ED Triage Vitals  Encounter Vitals Group     BP 07/19/23 1231 117/73     Systolic BP Percentile --      Diastolic BP Percentile --      Pulse Rate 07/19/23 1229 82     Resp 07/19/23 1229 20     Temp 07/19/23 1229 98.5 F (36.9 C)     Temp Source 07/19/23 1229 Oral     SpO2 07/19/23 1229 100 %     Weight 07/19/23 1229 180 lb (81.6 kg)     Height 07/19/23 1229 5\' 8"  (1.727 m)     Head Circumference --      Peak Flow --      Pain Score 07/19/23 1229 10     Pain Loc --      Pain Education --      Exclude from Growth Chart --     Most recent vital signs: Vitals:   07/19/23 1229 07/19/23 1231  BP:  117/73  Pulse: 82   Resp: 20   Temp: 98.5 F (36.9 C)   SpO2: 100%     General: Awake, no distress.  CV:  Good peripheral perfusion.  Regular rate and rhythm  Resp:  Normal effort.  Equal breath sounds bilaterally.  Abd:  No distention.  Soft, mild epigastric tenderness to palpation as well as mild suprapubic tenderness.  No rebound or guarding.  ED Results / Procedures / Treatments   EKG  EKG viewed and interpreted by myself shows a normal sinus rhythm at 85 bpm  with a narrow QRS, normal axis, normal intervals, no concerning ST changes.  RADIOLOGY  CT scan pending   MEDICATIONS ORDERED IN ED: Medications  0.9 %  sodium chloride infusion (has no administration in time range)  morphine (PF) 4 MG/ML injection 4 mg (has no administration in time range)  ondansetron (ZOFRAN) injection 4 mg (has no administration in time range)     IMPRESSION / MDM / ASSESSMENT AND PLAN / ED COURSE  I reviewed the triage vital signs and the nursing notes.  Patient's presentation is most consistent with acute presentation with potential threat to life or bodily function.  Patient presents to the emergency department for lower chest/upper abdominal discomfort/burning.  Patient has a history of gastritis to which this feels identical.  Patient states she is not taking any of her medications currently.  Patient's lab work has resulted showing a reassuring chemistry including normal LFTs, reassuring lipase.  Patient CBC however does show anemia with a hemoglobin of 6.8.  Patient has baseline hemoglobin appears to be closer to 8-9.  Patient states  a history of menorrhagia and has required blood transfusions in the past.  Patient admits she is supposed to be taking iron supplements but is not currently taking any.  Denies any black or bloody stool.  Does state constipation.  Given the patient's complaint of abdominal pain we will obtain CT imaging the abdomen and pelvis to further evaluate.  Given the patient's anemia below 7 due to menorrhagia (no current bleeding) we will transfuse 1 unit of packed red blood cells I have consented the patient for this.  We will have the patient follow-up with hematology for further evaluation and likely gynecology as well given her menorrhagia leading to anemia.  We will start the patient on iron supplements, Protonix and sucralfate for likely gastritis if her CT shows no other concerning findings.  We will refer to hematology and gynecology.   Patient agreeable to plan of care and workup.  We will treat the patient's pain and nausea while in the emergency department.  FINAL CLINICAL IMPRESSION(S) / ED DIAGNOSES   Abdominal pain Gastritis Anemia Menorrhagia  Rx / DC Orders   Protonix Sucralfate Ferrous sulfate  Note:  This document was prepared using Dragon voice recognition software and may include unintentional dictation errors.   Minna Antis, MD 07/20/23 2253

## 2023-07-19 NOTE — ED Triage Notes (Signed)
Patient states upper and lower abdominal pain for 2 weeks, today having burning in chest as well; denies N/V/D.

## 2023-07-19 NOTE — Discharge Instructions (Signed)
As we discussed please call the number provided for gynecology to arrange a follow-up appointment to discuss her heavy menstrual cycles and anemia.  Please call the number provided for hematology to arrange a follow-up appointment regarding your low blood counts.  Please take your iron supplements as prescribed.  Please begin taking Protonix each morning for the next 2 months, please take sucralfate before breakfast, lunch, dinner and before going to bed for the next 2 weeks.  Avoid any spicy foods alcohol or ibuprofen/aspirin products for the next 2 months.

## 2023-07-20 LAB — TYPE AND SCREEN
ABO/RH(D): A POS
Antibody Screen: NEGATIVE
Unit division: 0

## 2023-07-20 LAB — BPAM RBC
Blood Product Expiration Date: 202411192359
ISSUE DATE / TIME: 202410161510
Unit Type and Rh: 6200

## 2023-07-26 ENCOUNTER — Encounter: Payer: Self-pay | Admitting: Oncology

## 2023-07-28 ENCOUNTER — Inpatient Hospital Stay: Payer: 59 | Attending: Oncology | Admitting: Oncology

## 2023-07-28 ENCOUNTER — Encounter: Payer: Self-pay | Admitting: Oncology

## 2023-07-28 ENCOUNTER — Inpatient Hospital Stay: Payer: 59

## 2023-07-28 VITALS — BP 129/83 | HR 74 | Temp 96.9°F | Resp 20 | Wt 176.5 lb

## 2023-07-28 VITALS — BP 109/77 | HR 82 | Resp 18

## 2023-07-28 DIAGNOSIS — Z87891 Personal history of nicotine dependence: Secondary | ICD-10-CM | POA: Diagnosis not present

## 2023-07-28 DIAGNOSIS — N920 Excessive and frequent menstruation with regular cycle: Secondary | ICD-10-CM | POA: Diagnosis not present

## 2023-07-28 DIAGNOSIS — D500 Iron deficiency anemia secondary to blood loss (chronic): Secondary | ICD-10-CM

## 2023-07-28 DIAGNOSIS — D509 Iron deficiency anemia, unspecified: Secondary | ICD-10-CM | POA: Diagnosis not present

## 2023-07-28 LAB — CBC
HCT: 28.1 % — ABNORMAL LOW (ref 36.0–46.0)
Hemoglobin: 8 g/dL — ABNORMAL LOW (ref 12.0–15.0)
MCH: 20.7 pg — ABNORMAL LOW (ref 26.0–34.0)
MCHC: 28.5 g/dL — ABNORMAL LOW (ref 30.0–36.0)
MCV: 72.6 fL — ABNORMAL LOW (ref 80.0–100.0)
Platelets: 275 10*3/uL (ref 150–400)
RBC: 3.87 MIL/uL (ref 3.87–5.11)
RDW: 27.9 % — ABNORMAL HIGH (ref 11.5–15.5)
WBC: 8.2 10*3/uL (ref 4.0–10.5)
nRBC: 0 % (ref 0.0–0.2)

## 2023-07-28 LAB — IRON AND TIBC
Iron: 29 ug/dL (ref 28–170)
Saturation Ratios: 6 % — ABNORMAL LOW (ref 10.4–31.8)
TIBC: 462 ug/dL — ABNORMAL HIGH (ref 250–450)
UIBC: 433 ug/dL

## 2023-07-28 LAB — FERRITIN: Ferritin: 13 ng/mL (ref 11–307)

## 2023-07-28 LAB — FOLATE: Folate: 19.2 ng/mL (ref >5.9)

## 2023-07-28 LAB — VITAMIN B12: Vitamin B-12: 352 pg/mL (ref 180–914)

## 2023-07-28 MED ORDER — IRON SUCROSE 20 MG/ML IV SOLN
200.0000 mg | INTRAVENOUS | Status: DC
Start: 2023-07-28 — End: 2023-07-28
  Administered 2023-07-28: 200 mg via INTRAVENOUS

## 2023-07-28 NOTE — Patient Instructions (Signed)
Iron Sucrose Injection What is this medication? IRON SUCROSE (EYE ern SOO krose) treats low levels of iron (iron deficiency anemia) in people with kidney disease. Iron is a mineral that plays an important role in making red blood cells, which carry oxygen from your lungs to the rest of your body. This medicine may be used for other purposes; ask your health care provider or pharmacist if you have questions. COMMON BRAND NAME(S): Venofer What should I tell my care team before I take this medication? They need to know if you have any of these conditions: Anemia not caused by low iron levels Heart disease High levels of iron in the blood Kidney disease Liver disease An unusual or allergic reaction to iron, other medications, foods, dyes, or preservatives Pregnant or trying to get pregnant Breastfeeding How should I use this medication? This medication is for infusion into a vein. It is given in a hospital or clinic setting. Talk to your care team about the use of this medication in children. While this medication may be prescribed for children as young as 2 years for selected conditions, precautions do apply. Overdosage: If you think you have taken too much of this medicine contact a poison control center or emergency room at once. NOTE: This medicine is only for you. Do not share this medicine with others. What if I miss a dose? Keep appointments for follow-up doses. It is important not to miss your dose. Call your care team if you are unable to keep an appointment. What may interact with this medication? Do not take this medication with any of the following: Deferoxamine Dimercaprol Other iron products This medication may also interact with the following: Chloramphenicol Deferasirox This list may not describe all possible interactions. Give your health care provider a list of all the medicines, herbs, non-prescription drugs, or dietary supplements you use. Also tell them if you smoke,  drink alcohol, or use illegal drugs. Some items may interact with your medicine. What should I watch for while using this medication? Visit your care team regularly. Tell your care team if your symptoms do not start to get better or if they get worse. You may need blood work done while you are taking this medication. You may need to follow a special diet. Talk to your care team. Foods that contain iron include: whole grains/cereals, dried fruits, beans, or peas, leafy green vegetables, and organ meats (liver, kidney). What side effects may I notice from receiving this medication? Side effects that you should report to your care team as soon as possible: Allergic reactions--skin rash, itching, hives, swelling of the face, lips, tongue, or throat Low blood pressure--dizziness, feeling faint or lightheaded, blurry vision Shortness of breath Side effects that usually do not require medical attention (report to your care team if they continue or are bothersome): Flushing Headache Joint pain Muscle pain Nausea Pain, redness, or irritation at injection site This list may not describe all possible side effects. Call your doctor for medical advice about side effects. You may report side effects to FDA at 1-800-FDA-1088. Where should I keep my medication? This medication is given in a hospital or clinic. It will not be stored at home. NOTE: This sheet is a summary. It may not cover all possible information. If you have questions about this medicine, talk to your doctor, pharmacist, or health care provider.  2024 Elsevier/Gold Standard (2023-02-24 00:00:00)

## 2023-07-30 ENCOUNTER — Encounter: Payer: Self-pay | Admitting: Oncology

## 2023-07-30 NOTE — Progress Notes (Signed)
Hematology/Oncology Consult note [facility]  [phone] [phone]  Patient Care Team: Patient, No Pcp Per as PCP - General (General Practice)   Name of the patient: Jeniyah Cauthorn  [id number]  01/11/1981   Date of visit: 07/30/23  Diagnosis-iron deficiency anemia  Chief complaint/ Reason for visit-he establish follow-up for iron deficiency anemia  Heme/Onc history: patient is a 42 year old African-American femaleWho was seen in the ER for symptoms of cramping abdominal pain.  She also reported chronic fatigue and had her blood work checked which showed an H&H of 7.8/28.6 with an MCV of 67 and a platelet count of 333.  White count normal at 10.  Looking back at her prior CBCs her hemoglobin has been chronically low between 6-7 dating back to 2017 with chronic microcytosis.  No recent iron studies checked.  Her iron levels back in May 2022 showed evidence of iron deficiency.   Family history of colon cancer in her father.  She has had a prior upper endoscopy by Dr. Arlyce Dice showed a hiatal hernia and nonbleeding gastric ulcers.  She has not had any colonoscopy so far.  Patient reports that her menstrual cycles are heavy in the last for about 7 days and the first 3 to 4 days are particularly heavy.  She did undergo a pelvic ultrasound in November 2022 as well which showed multiple uterine hemorrhoids and probably benign but a complex cyst in the right ovary.  She denies any consistent use of NSAIDs Goody powder or BC powder.  Presently denies any blood in her stool or urine.  She reports ongoing fatigue  Interval history-patient was seen by me last year for iron deficiency anemia and subsequent she was lost to follow-up.  She again presented to the ER in October 2024 with significant menorrhagia and iron deficiency anemia requiring blood transfusion.  She had CT abdomen and pelvis which showed multiple fibroids for which she will be seeing GYN  soon.   ECOG PS- 1 Pain scale- 0   Review of systems- Review of Systems  Constitutional:  Positive for malaise/fatigue.  Genitourinary:        Menorrhagia      No Known Allergies   Past Medical History:  Diagnosis Date   Anemia    Fibroids    GERD (gastroesophageal reflux disease)    Sickle cell trait (HCC)    Ulcer of abdomen wall (HCC)      Past Surgical History:  Procedure Laterality Date   ESOPHAGOGASTRODUODENOSCOPY (EGD) WITH PROPOFOL N/A 02/26/2016   Procedure: ESOPHAGOGASTRODUODENOSCOPY (EGD) WITH PROPOFOL;  Surgeon: [name], MD;  Location: [facility] ENDOSCOPY;  Service: Endoscopy;  Laterality: N/A;   ESOPHAGOGASTRODUODENOSCOPY (EGD) WITH PROPOFOL N/A 08/22/2022   Procedure: ESOPHAGOGASTRODUODENOSCOPY (EGD) WITH PROPOFOL;  Surgeon: Toledo, Boykin Nearing, MD;  Location: [facility] ENDOSCOPY;  Service: Gastroenterology;  Laterality: N/A;   WISDOM TOOTH EXTRACTION Bilateral     Social History   Socioeconomic History   Marital status: Married    Spouse name: Not on file   Number of children: Not on file   Years of education: Not on file   Highest education level: Not on file  Occupational History   Not on file  Tobacco Use   Smoking status: Former    Current packs/day: 0.00    Average packs/day: 2.0 packs/day for 26.0 years (52.0 ttl pk-yrs)    Types: Cigarettes    Start date: 08/24/1996    Quit date: 08/24/2022    Years since quitting:  0.9   Smokeless tobacco: Never  Vaping Use   Vaping status: Never Used  Substance and Sexual Activity   Alcohol use: Not Currently   Drug use: Not Currently    Types: Marijuana   Sexual activity: Yes    Partners: Male    Comment: Desires pregnancy  Other Topics Concern   Not on file  Social History Narrative   Not on file   Social Determinants of Health   Financial Resource Strain: Not on file  Food Insecurity: Food Insecurity Present (07/28/2023)   Hunger Vital Sign    Worried About Running Out of Food in the Last  Year: Sometimes true    Ran Out of Food in the Last Year: Sometimes true  Transportation Needs: Unmet Transportation Needs (07/28/2023)   PRAPARE - Administrator, Civil Service (Medical): Yes    Lack of Transportation (Non-Medical): Yes  Physical Activity: Not on file  Stress: Not on file  Social Connections: Not on file  Intimate Partner Violence: Not At Risk (07/28/2023)   Humiliation, Afraid, Rape, and Kick questionnaire    Fear of Current or Ex-Partner: No    Emotionally Abused: No    Physically Abused: No    Sexually Abused: No    Family History  Problem Relation Age of Onset   Diabetes Mother    Hypertension Mother    Lung cancer Father    Leukemia Father    Lung cancer Maternal Grandmother    Colon cancer Maternal Grandfather    Cancer Paternal Grandmother    Cancer Paternal Grandfather      Current Outpatient Medications:    ferrous sulfate 325 (65 FE) MG EC tablet, Take 1 tablet (325 mg total) by mouth 2 (two) times daily., Disp: 60 tablet, Rfl: 3   nicotine (NICODERM CQ - DOSED IN MG/24 HOURS) 21 mg/24hr patch, One patch chest wall daily (okay to substitute generic), Disp: 28 patch, Rfl: 0   pantoprazole (PROTONIX) 40 MG tablet, Take 1 tablet (40 mg total) by mouth daily., Disp: 30 tablet, Rfl: 0   sucralfate (CARAFATE) 1 g tablet, Take 1 tablet (1 g total) by mouth 4 (four) times daily for 15 days., Disp: 60 tablet, Rfl: 0  Physical exam:  Vitals:   07/28/23 1341  BP: 129/83  Pulse: 74  Resp: 20  Temp: (!) 96.9 F (36.1 C)  SpO2: 100%  Weight: 176 lb 8 oz (80.1 kg)   Physical Exam Cardiovascular:     Rate and Rhythm: Normal rate and regular rhythm.     Heart sounds: Normal heart sounds.  Pulmonary:     Effort: Pulmonary effort is normal.     Breath sounds: Normal breath sounds.  Abdominal:     General: Bowel sounds are normal.     Palpations: Abdomen is soft.  Skin:    General: Skin is warm and dry.  Neurological:     Mental Status:  She is alert and oriented to person, place, and time.         Latest Ref Rng & Units 07/19/2023   12:35 PM  CMP  Glucose 70 - 99 mg/dL 540   BUN 6 - 20 mg/dL 14   Creatinine 9.81 - 1.00 mg/dL 1.91   Sodium 478 - 295 mmol/L 137   Potassium 3.5 - 5.1 mmol/L 3.6   Chloride 98 - 111 mmol/L 108   CO2 22 - 32 mmol/L 23   Calcium 8.9 - 10.3 mg/dL 8.7   Total Protein  6.5 - 8.1 g/dL 7.7   Total Bilirubin 0.3 - 1.2 mg/dL 0.2   Alkaline Phos 38 - 126 U/L 44   AST 15 - 41 U/L 13   ALT 0 - 44 U/L 10       Latest Ref Rng & Units 07/28/2023    2:25 PM  CBC  WBC 4.0 - 10.5 K/uL 8.2   Hemoglobin 12.0 - 15.0 g/dL 8.0   Hematocrit 62.9 - 46.0 % 28.1   Platelets 150 - 400 K/uL 275     No images are attached to the encounter.  CT ABDOMEN PELVIS W CONTRAST  Result Date: 07/19/2023 CLINICAL DATA:  Upper and lower abdominal pain for 2 weeks. EXAM: CT ABDOMEN AND PELVIS WITH CONTRAST TECHNIQUE: Multidetector CT imaging of the abdomen and pelvis was performed using the standard protocol following bolus administration of intravenous contrast. RADIATION DOSE REDUCTION: This exam was performed according to the departmental dose-optimization program which includes automated exposure control, adjustment of the mA and/or kV according to patient size and/or use of iterative reconstruction technique. CONTRAST:  OMNIPAQUE IOHEXOL 300 MG/ML  SOLN COMPARISON:  Noncontrast CT on 01/19/2022 FINDINGS: Lower Chest: No acute findings. Hepatobiliary: No suspicious liver lesions identified. Tiny calcified gallstones again noted. No signs of cholecystitis or biliary ductal dilatation. Pancreas:  No mass or inflammatory changes. Spleen: Within normal limits in size and appearance. Adrenals/Urinary Tract: No suspicious masses identified. Focal ill-defined area of decreased parenchymal enhancement is seen in the lower pole of the right kidney. This may be due to pyelonephritis or renal infarct. No evidence of ureteral  calculi or hydronephrosis. Unremarkable unopacified urinary bladder. Stomach/Bowel: No evidence of obstruction, inflammatory process or abnormal fluid collections. Normal appendix visualized. Vascular/Lymphatic: No pathologically enlarged lymph nodes. No acute vascular findings. Reproductive: Increased uterine enlargement is seen with multiple fibroids. Largest in the uterine fundus measures 4.8 cm. Adnexal regions are unremarkable. Other:  None. Musculoskeletal:  No suspicious bone lesions identified. IMPRESSION: Focal ill-defined area of decreased parenchymal enhancement in lower pole of right kidney, which may be due to pyelonephritis or renal infarct. Recommend correlation with urinalysis. Multiple uterine fibroids, largest measuring 4.8 cm, increased since prior exam. Cholelithiasis. No radiographic evidence of cholecystitis. Electronically Signed   By: Danae Orleans M.D.   On: 07/19/2023 16:48     Assessment and plan- Patient is a 42 y.o. female routine follow-up of iron deficiency anemia  Patient was lost to follow-up for iron deficiency anemia after she will saw me last year.  Presently the patient is significantly anemic with a hemoglobin of 8.  Iron studies are indicative of iron deficiency.  We will proceed with IV iron at this time.  She will get 5 doses of Venofer starting today.  I will repeat CBC in 1 month and again CBC ferritin and iron studies in 3 months and see her thereafter.  Etiology of her iron deficiency anemia secondary to menorrhagia for which she will be seeing GYN soon   Visit Diagnosis 1. Iron deficiency anemia, unspecified iron deficiency anemia type      Dr. Owens Shark, MD, MPH [facility] [phone] 07/30/2023 11:10 AM

## 2023-08-01 ENCOUNTER — Encounter: Payer: Medicaid Other | Admitting: Obstetrics & Gynecology

## 2023-08-04 ENCOUNTER — Inpatient Hospital Stay: Payer: 59 | Attending: Oncology

## 2023-08-04 DIAGNOSIS — N920 Excessive and frequent menstruation with regular cycle: Secondary | ICD-10-CM | POA: Insufficient documentation

## 2023-08-04 DIAGNOSIS — D500 Iron deficiency anemia secondary to blood loss (chronic): Secondary | ICD-10-CM | POA: Insufficient documentation

## 2023-08-08 ENCOUNTER — Encounter: Payer: Medicaid Other | Admitting: Obstetrics & Gynecology

## 2023-08-11 ENCOUNTER — Inpatient Hospital Stay: Payer: 59

## 2023-08-18 ENCOUNTER — Inpatient Hospital Stay: Payer: 59

## 2023-08-25 ENCOUNTER — Other Ambulatory Visit: Payer: Self-pay | Admitting: Oncology

## 2023-08-25 ENCOUNTER — Inpatient Hospital Stay: Payer: 59

## 2023-08-25 VITALS — BP 106/68 | HR 82 | Temp 96.9°F | Resp 17

## 2023-08-25 DIAGNOSIS — D500 Iron deficiency anemia secondary to blood loss (chronic): Secondary | ICD-10-CM | POA: Diagnosis not present

## 2023-08-25 DIAGNOSIS — D509 Iron deficiency anemia, unspecified: Secondary | ICD-10-CM

## 2023-08-25 DIAGNOSIS — N920 Excessive and frequent menstruation with regular cycle: Secondary | ICD-10-CM | POA: Diagnosis not present

## 2023-08-25 LAB — CBC
HCT: 29.9 % — ABNORMAL LOW (ref 36.0–46.0)
Hemoglobin: 8.9 g/dL — ABNORMAL LOW (ref 12.0–15.0)
MCH: 23.9 pg — ABNORMAL LOW (ref 26.0–34.0)
MCHC: 29.8 g/dL — ABNORMAL LOW (ref 30.0–36.0)
MCV: 80.4 fL (ref 80.0–100.0)
Platelets: 277 10*3/uL (ref 150–400)
RBC: 3.72 MIL/uL — ABNORMAL LOW (ref 3.87–5.11)
RDW: 27 % — ABNORMAL HIGH (ref 11.5–15.5)
WBC: 10.1 10*3/uL (ref 4.0–10.5)
nRBC: 0 % (ref 0.0–0.2)

## 2023-08-25 MED ORDER — IRON SUCROSE 20 MG/ML IV SOLN
200.0000 mg | INTRAVENOUS | Status: DC
Start: 2023-08-25 — End: 2023-08-25
  Administered 2023-08-25: 200 mg via INTRAVENOUS
  Filled 2023-08-25: qty 10

## 2023-08-25 MED ORDER — METHYLPREDNISOLONE SODIUM SUCC 125 MG IJ SOLR
40.0000 mg | Freq: Every day | INTRAMUSCULAR | Status: DC
Start: 2023-08-25 — End: 2023-08-25
  Administered 2023-08-25: 40 mg via INTRAVENOUS
  Filled 2023-08-25: qty 2

## 2023-08-25 MED ORDER — SODIUM CHLORIDE 0.9 % IV SOLN
Freq: Once | INTRAVENOUS | Status: DC
  Filled 2023-08-25: qty 250

## 2023-08-25 NOTE — Progress Notes (Signed)
Pt states with previous dose of venofer on 10/25 she experienced extreme fatigue, headache, nausea, inability to eat anything x 2 days.  Dr Smith Robert notified.  Solumedrol added to treatment as premedication.  Pt in agreement with plan.

## 2023-08-25 NOTE — Patient Instructions (Signed)
Iron Sucrose Injection What is this medication? IRON SUCROSE (EYE ern SOO krose) treats low levels of iron (iron deficiency anemia) in people with kidney disease. Iron is a mineral that plays an important role in making red blood cells, which carry oxygen from your lungs to the rest of your body. This medicine may be used for other purposes; ask your health care provider or pharmacist if you have questions. COMMON BRAND NAME(S): Venofer What should I tell my care team before I take this medication? They need to know if you have any of these conditions: Anemia not caused by low iron levels Heart disease High levels of iron in the blood Kidney disease Liver disease An unusual or allergic reaction to iron, other medications, foods, dyes, or preservatives Pregnant or trying to get pregnant Breastfeeding How should I use this medication? This medication is for infusion into a vein. It is given in a hospital or clinic setting. Talk to your care team about the use of this medication in children. While this medication may be prescribed for children as young as 2 years for selected conditions, precautions do apply. Overdosage: If you think you have taken too much of this medicine contact a poison control center or emergency room at once. NOTE: This medicine is only for you. Do not share this medicine with others. What if I miss a dose? Keep appointments for follow-up doses. It is important not to miss your dose. Call your care team if you are unable to keep an appointment. What may interact with this medication? Do not take this medication with any of the following: Deferoxamine Dimercaprol Other iron products This medication may also interact with the following: Chloramphenicol Deferasirox This list may not describe all possible interactions. Give your health care provider a list of all the medicines, herbs, non-prescription drugs, or dietary supplements you use. Also tell them if you smoke,  drink alcohol, or use illegal drugs. Some items may interact with your medicine. What should I watch for while using this medication? Visit your care team regularly. Tell your care team if your symptoms do not start to get better or if they get worse. You may need blood work done while you are taking this medication. You may need to follow a special diet. Talk to your care team. Foods that contain iron include: whole grains/cereals, dried fruits, beans, or peas, leafy green vegetables, and organ meats (liver, kidney). What side effects may I notice from receiving this medication? Side effects that you should report to your care team as soon as possible: Allergic reactions--skin rash, itching, hives, swelling of the face, lips, tongue, or throat Low blood pressure--dizziness, feeling faint or lightheaded, blurry vision Shortness of breath Side effects that usually do not require medical attention (report to your care team if they continue or are bothersome): Flushing Headache Joint pain Muscle pain Nausea Pain, redness, or irritation at injection site This list may not describe all possible side effects. Call your doctor for medical advice about side effects. You may report side effects to FDA at 1-800-FDA-1088. Where should I keep my medication? This medication is given in a hospital or clinic. It will not be stored at home. NOTE: This sheet is a summary. It may not cover all possible information. If you have questions about this medicine, talk to your doctor, pharmacist, or health care provider.  2024 Elsevier/Gold Standard (2023-02-24 00:00:00)

## 2023-08-28 ENCOUNTER — Telehealth: Payer: Self-pay | Admitting: *Deleted

## 2023-08-28 NOTE — Telephone Encounter (Signed)
Patient called and left message that she had Iron infusion last Friday and since then she is having abdominal pain, "really really hurting bad, I can't sleep at all" I attempted to call her back for more information, but it went to voice mail and I had to leave a message for her to call me back

## 2023-08-29 ENCOUNTER — Telehealth: Payer: Self-pay | Admitting: *Deleted

## 2023-08-29 ENCOUNTER — Other Ambulatory Visit: Payer: Self-pay | Admitting: *Deleted

## 2023-08-29 ENCOUNTER — Encounter: Payer: Self-pay | Admitting: Oncology

## 2023-08-29 MED ORDER — DICYCLOMINE HCL 20 MG PO TABS: 20.0000 mg | ORAL_TABLET | Freq: Four times a day (QID) | ORAL | 0 refills | Status: DC | PRN

## 2023-08-29 NOTE — Telephone Encounter (Signed)
Can you see how she is doing? We can give her a short course of steroids for 5 days and see if it helps her pain. We will need to switch her to a different iv iron next time

## 2023-08-29 NOTE — Telephone Encounter (Signed)
I called to see if her abd. Pain is the same, not got better all. Dr. Smith Robert states that she can have protonix and Bentyl - it is a medicine for abd. Pain. She can take it 4 times a day as needed. The pt is already on protonix 40 mg and I told her to continue that and take Bentyl. She is good with this plan and she should call 3-5 days if it does not get some better. She is agreeable.

## 2023-09-08 ENCOUNTER — Inpatient Hospital Stay: Payer: 59 | Attending: Oncology

## 2023-09-15 ENCOUNTER — Inpatient Hospital Stay: Payer: 59

## 2023-09-22 ENCOUNTER — Inpatient Hospital Stay: Payer: 59

## 2023-10-25 ENCOUNTER — Other Ambulatory Visit: Payer: Self-pay

## 2023-10-25 DIAGNOSIS — D500 Iron deficiency anemia secondary to blood loss (chronic): Secondary | ICD-10-CM

## 2023-10-26 ENCOUNTER — Ambulatory Visit: Payer: 59

## 2023-10-27 ENCOUNTER — Inpatient Hospital Stay: Payer: 59 | Admitting: Oncology

## 2023-10-27 ENCOUNTER — Encounter: Payer: Self-pay | Admitting: Oncology

## 2023-10-27 ENCOUNTER — Inpatient Hospital Stay: Payer: 59 | Attending: Oncology

## 2023-11-21 ENCOUNTER — Emergency Department: Payer: 59

## 2023-11-21 ENCOUNTER — Other Ambulatory Visit: Payer: Self-pay

## 2023-11-21 ENCOUNTER — Encounter: Payer: Self-pay | Admitting: Obstetrics and Gynecology

## 2023-11-21 ENCOUNTER — Inpatient Hospital Stay
Admission: EM | Admit: 2023-11-21 | Discharge: 2023-11-24 | DRG: 917 | Disposition: A | Discharge status: Home / Self Care | Payer: 59 | Attending: Internal Medicine | Admitting: Internal Medicine

## 2023-11-21 DIAGNOSIS — Z79899 Other long term (current) drug therapy: Secondary | ICD-10-CM

## 2023-11-21 DIAGNOSIS — Z800 Family history of malignant neoplasm of digestive organs: Secondary | ICD-10-CM

## 2023-11-21 DIAGNOSIS — D62 Acute posthemorrhagic anemia: Secondary | ICD-10-CM | POA: Diagnosis not present

## 2023-11-21 DIAGNOSIS — Z23 Encounter for immunization: Secondary | ICD-10-CM

## 2023-11-21 DIAGNOSIS — K219 Gastro-esophageal reflux disease without esophagitis: Secondary | ICD-10-CM | POA: Diagnosis present

## 2023-11-21 DIAGNOSIS — F17210 Nicotine dependence, cigarettes, uncomplicated: Secondary | ICD-10-CM | POA: Diagnosis present

## 2023-11-21 DIAGNOSIS — D573 Sickle-cell trait: Secondary | ICD-10-CM | POA: Diagnosis present

## 2023-11-21 DIAGNOSIS — Z716 Tobacco abuse counseling: Secondary | ICD-10-CM | POA: Diagnosis not present

## 2023-11-21 DIAGNOSIS — Z801 Family history of malignant neoplasm of trachea, bronchus and lung: Secondary | ICD-10-CM | POA: Diagnosis not present

## 2023-11-21 DIAGNOSIS — Z8711 Personal history of peptic ulcer disease: Secondary | ICD-10-CM | POA: Diagnosis not present

## 2023-11-21 DIAGNOSIS — R109 Unspecified abdominal pain: Secondary | ICD-10-CM

## 2023-11-21 DIAGNOSIS — Z833 Family history of diabetes mellitus: Secondary | ICD-10-CM | POA: Diagnosis not present

## 2023-11-21 DIAGNOSIS — Z8249 Family history of ischemic heart disease and other diseases of the circulatory system: Secondary | ICD-10-CM

## 2023-11-21 DIAGNOSIS — E876 Hypokalemia: Secondary | ICD-10-CM | POA: Diagnosis not present

## 2023-11-21 DIAGNOSIS — K921 Melena: Secondary | ICD-10-CM

## 2023-11-21 DIAGNOSIS — R0789 Other chest pain: Secondary | ICD-10-CM | POA: Diagnosis present

## 2023-11-21 DIAGNOSIS — D649 Anemia, unspecified: Secondary | ICD-10-CM

## 2023-11-21 DIAGNOSIS — Z806 Family history of leukemia: Secondary | ICD-10-CM

## 2023-11-21 DIAGNOSIS — T39311A Poisoning by propionic acid derivatives, accidental (unintentional), initial encounter: Principal | ICD-10-CM | POA: Diagnosis present

## 2023-11-21 DIAGNOSIS — F17200 Nicotine dependence, unspecified, uncomplicated: Secondary | ICD-10-CM | POA: Diagnosis present

## 2023-11-21 DIAGNOSIS — K3189 Other diseases of stomach and duodenum: Secondary | ICD-10-CM | POA: Diagnosis not present

## 2023-11-21 DIAGNOSIS — M25511 Pain in right shoulder: Secondary | ICD-10-CM | POA: Diagnosis present

## 2023-11-21 DIAGNOSIS — N920 Excessive and frequent menstruation with regular cycle: Secondary | ICD-10-CM | POA: Diagnosis present

## 2023-11-21 DIAGNOSIS — D500 Iron deficiency anemia secondary to blood loss (chronic): Secondary | ICD-10-CM | POA: Diagnosis present

## 2023-11-21 DIAGNOSIS — R1030 Lower abdominal pain, unspecified: Principal | ICD-10-CM

## 2023-11-21 DIAGNOSIS — K2211 Ulcer of esophagus with bleeding: Secondary | ICD-10-CM | POA: Diagnosis present

## 2023-11-21 DIAGNOSIS — R079 Chest pain, unspecified: Secondary | ICD-10-CM | POA: Insufficient documentation

## 2023-11-21 LAB — LIPASE, BLOOD: Lipase: 44 U/L (ref 11–51)

## 2023-11-21 LAB — CBC
HCT: 25.8 % — ABNORMAL LOW (ref 36.0–46.0)
Hemoglobin: 7.6 g/dL — ABNORMAL LOW (ref 12.0–15.0)
MCH: 21.6 pg — ABNORMAL LOW (ref 26.0–34.0)
MCHC: 29.5 g/dL — ABNORMAL LOW (ref 30.0–36.0)
MCV: 73.3 fL — ABNORMAL LOW (ref 80.0–100.0)
Platelets: 232 10*3/uL (ref 150–400)
RBC: 3.52 MIL/uL — ABNORMAL LOW (ref 3.87–5.11)
RDW: 21.3 % — ABNORMAL HIGH (ref 11.5–15.5)
WBC: 6.9 10*3/uL (ref 4.0–10.5)
nRBC: 0 % (ref 0.0–0.2)

## 2023-11-21 LAB — COMPREHENSIVE METABOLIC PANEL
ALT: 11 U/L (ref 0–44)
AST: 16 U/L (ref 15–41)
Albumin: 3.6 g/dL (ref 3.5–5.0)
Alkaline Phosphatase: 41 U/L (ref 38–126)
Anion gap: 9 (ref 5–15)
BUN: 13 mg/dL (ref 6–20)
CO2: 23 mmol/L (ref 22–32)
Calcium: 8.4 mg/dL — ABNORMAL LOW (ref 8.9–10.3)
Chloride: 106 mmol/L (ref 98–111)
Creatinine, Ser: 0.68 mg/dL (ref 0.44–1.00)
GFR, Estimated: >60 mL/min (ref >60)
Glucose, Bld: 117 mg/dL — ABNORMAL HIGH (ref 70–99)
Potassium: 3.7 mmol/L (ref 3.5–5.1)
Sodium: 138 mmol/L (ref 135–145)
Total Bilirubin: 0.4 mg/dL (ref 0.0–1.2)
Total Protein: 6.9 g/dL (ref 6.5–8.1)

## 2023-11-21 LAB — URINALYSIS, ROUTINE W REFLEX MICROSCOPIC
Bacteria, UA: NONE SEEN
Bilirubin Urine: NEGATIVE
Glucose, UA: NEGATIVE mg/dL
Ketones, ur: NEGATIVE mg/dL
Leukocytes,Ua: NEGATIVE
Nitrite: NEGATIVE
Protein, ur: NEGATIVE mg/dL
Specific Gravity, Urine: 1.023 (ref 1.005–1.030)
pH: 5 (ref 5.0–8.0)

## 2023-11-21 LAB — PREGNANCY, URINE: Preg Test, Ur: NEGATIVE

## 2023-11-21 LAB — TROPONIN I (HIGH SENSITIVITY): Troponin I (High Sensitivity): <2 ng/L (ref <18)

## 2023-11-21 MED ORDER — TRAMADOL HCL 50 MG PO TABS
50.0000 mg | ORAL_TABLET | Freq: Once | ORAL | Status: AC
  Administered 2023-11-21: 50 mg via ORAL
  Filled 2023-11-21: qty 1

## 2023-11-21 MED ORDER — IOHEXOL 300 MG/ML  SOLN
100.0000 mL | Freq: Once | INTRAMUSCULAR | Status: AC | PRN
  Administered 2023-11-21: 100 mL via INTRAVENOUS

## 2023-11-21 MED ORDER — PANTOPRAZOLE SODIUM 40 MG IV SOLR
80.0000 mg | Freq: Once | INTRAVENOUS | Status: AC
  Administered 2023-11-21: 80 mg via INTRAVENOUS
  Filled 2023-11-21: qty 20

## 2023-11-21 MED ORDER — PANTOPRAZOLE SODIUM 40 MG IV SOLR
40.0000 mg | Freq: Two times a day (BID) | INTRAVENOUS | Status: DC
  Administered 2023-11-21 – 2023-11-24 (×6): 40 mg via INTRAVENOUS
  Filled 2023-11-21 (×6): qty 10

## 2023-11-21 MED ORDER — INFLUENZA VIRUS VACC SPLIT PF (FLUZONE) 0.5 ML IM SUSY
0.5000 mL | PREFILLED_SYRINGE | INTRAMUSCULAR | Status: AC
  Administered 2023-11-24: 0.5 mL via INTRAMUSCULAR
  Filled 2023-11-21: qty 0.5

## 2023-11-21 MED ORDER — SODIUM CHLORIDE 0.9% FLUSH
3.0000 mL | Freq: Two times a day (BID) | INTRAVENOUS | Status: DC
  Administered 2023-11-21 – 2023-11-24 (×6): 3 mL via INTRAVENOUS

## 2023-11-21 MED ORDER — MORPHINE SULFATE (PF) 2 MG/ML IV SOLN
2.0000 mg | Freq: Once | INTRAVENOUS | Status: AC
  Administered 2023-11-21: 2 mg via INTRAVENOUS
  Filled 2023-11-21: qty 1

## 2023-11-21 MED ORDER — BOOST / RESOURCE BREEZE PO LIQD CUSTOM
1.0000 | Freq: Three times a day (TID) | ORAL | Status: DC
  Administered 2023-11-21 – 2023-11-23 (×4): 1 via ORAL

## 2023-11-21 MED ORDER — SODIUM CHLORIDE 0.9 % IV SOLN
250.0000 mL | INTRAVENOUS | Status: AC | PRN
  Administered 2023-11-22: 100 mL via INTRAVENOUS

## 2023-11-21 MED ORDER — LACTATED RINGERS IV BOLUS
1000.0000 mL | Freq: Once | INTRAVENOUS | Status: AC
  Administered 2023-11-21: 1000 mL via INTRAVENOUS

## 2023-11-21 MED ORDER — SODIUM CHLORIDE 0.9% FLUSH: 3.0000 mL | INTRAVENOUS | Status: DC | PRN

## 2023-11-21 MED ORDER — NICOTINE 21 MG/24HR TD PT24
21.0000 mg | MEDICATED_PATCH | Freq: Every day | TRANSDERMAL | Status: DC
  Administered 2023-11-21 – 2023-11-23 (×3): 21 mg via TRANSDERMAL
  Filled 2023-11-21 (×3): qty 1

## 2023-11-21 NOTE — Plan of Care (Signed)

## 2023-11-21 NOTE — ED Provider Notes (Addendum)
 [facility] Provider Note    Event Date/Time   First MD Initiated Contact with Patient 11/21/23 1614     (approximate)   History   Abdominal Pain   HPI  Carolyn [name] is a 43 y.o. female with history of iron deficiency anemia, heavy periods, history of prior blood transfusions, history of hiatal hernia and nonbleeding gastric ulcers presenting with melanotic stools for the last 3 to 4 days.  Does have suprapubic abdominal pain but also noted some right sided flank pain as well as chest pain.  States that the right-sided pain is worse when she is laying on it or twisting.  No pain to her side right now.  No difficulty breathing or cough, no hematuria or vaginal bleeding.  No hematemesis.  Does not take any blood thinners.  No fevers at home.  States that the suprapubic pain is chronic in nature and not new.  Has not taken any Pepto-Bismol.  States that black stools are similar to the one she has had in the past where she would be anemic and is associated with the gastric ulcers.  On independent chart review, she was seen by oncology in October of last year, had been seen by Dr. Servando Snare in the past for an upper endoscopy that showed hiatal hernia with nonbleeding gastric ulcers, note states that she has not had a colonoscopy so far and patient confirms that.  Has had history of heavy menstrual bleeding last about 7 days.     Physical Exam   Triage Vital Signs: ED Triage Vitals  Encounter Vitals Group     BP 11/21/23 1143 118/84     Systolic BP Percentile --      Diastolic BP Percentile --      Pulse Rate 11/21/23 1142 78     Resp 11/21/23 1142 16     Temp 11/21/23 1142 98.4 F (36.9 C)     Temp src --      SpO2 11/21/23 1142 98 %     Weight 11/21/23 1142 176 lb 9.4 oz (80.1 kg)     Height 11/21/23 1142 5\' 8"  (1.727 m)     Head Circumference --      Peak Flow --      Pain Score 11/21/23 1142 10     Pain Loc --      Pain Education --      Exclude from  Growth Chart --     Most recent vital signs: Vitals:   11/21/23 1143 11/21/23 1703  BP: 118/84 127/79  Pulse:  80  Resp:  17  Temp:  98.2 F (36.8 C)  SpO2:  100%     General: Awake, no distress.  CV:  Good peripheral perfusion.  Resp:  Normal effort.  No tachypnea or increased work of breathing Abd:  No distention.  No tenderness to her flank, upper abdomen, she does have some suprapubic tenderness. Other:  No thoracic cage tenderness.  Rectal exam was done with chaperone, no stool in rectal vault, was able to get some light brown mucus that tested Hemoccult positive, no bright red blood on rectal exam.  No rash to her flank.   ED Results / Procedures / Treatments   Labs (all labs ordered are listed, but only abnormal results are displayed) Labs Reviewed  COMPREHENSIVE METABOLIC PANEL - Abnormal; Notable for the following components:      Result Value   Glucose, Bld 117 (*)    Calcium  8.4 (*)    All other components within normal limits  CBC - Abnormal; Notable for the following components:   RBC 3.52 (*)    Hemoglobin 7.6 (*)    HCT 25.8 (*)    MCV 73.3 (*)    MCH 21.6 (*)    MCHC 29.5 (*)    RDW 21.3 (*)    All other components within normal limits  URINALYSIS, ROUTINE W REFLEX MICROSCOPIC - Abnormal; Notable for the following components:   Color, Urine YELLOW (*)    APPearance HAZY (*)    Hgb urine dipstick SMALL (*)    All other components within normal limits  LIPASE, BLOOD  PREGNANCY, URINE  TYPE AND SCREEN  TROPONIN I (HIGH SENSITIVITY)   EKG EKG shows sinus rhythm, rate 76, normal QRS, normal QTc, T wave flattening in V2, no ischemic ST elevation, not sig changed compared to prior.   RADIOLOGY Chest x-ray on my interpretation without obvious consolidation.   PROCEDURES:  Critical Care performed: No  Procedures   MEDICATIONS ORDERED IN ED: Medications  iohexol (OMNIPAQUE) 300 MG/ML solution 100 mL (100 mLs Intravenous Contrast Given 11/21/23  1430)  pantoprazole (PROTONIX) injection 80 mg (80 mg Intravenous Given 11/21/23 1636)  morphine (PF) 2 MG/ML injection 2 mg (2 mg Intravenous Given 11/21/23 1708)  lactated ringers bolus 1,000 mL (1,000 mLs Intravenous New Bag/Given 11/21/23 1710)     IMPRESSION / MDM / ASSESSMENT AND PLAN / ED COURSE  I reviewed the triage vital signs and the nursing notes.                              Differential diagnosis includes, but is not limited to, upper GI bleed, lower GI bleed, symptomatic anemia, bleeding gastric ulcers, colitis, diverticulitis, UTI, ACS, musculoskeletal pain, also considered kidney stone given the flank pain, no rashes this zoster.  Labs, UA, urine pregnancy, abdomen pelvis was done out of triage.  Added on an EKG, troponin, chest x-ray.  Will give her IV Protonix.  She will likely need to be admitted for further management.  Patient's presentation is most consistent with acute presentation with potential threat to life or bodily function.  CT imaging did not show acute process, did show enlarged fibroid uterus that was seen previously.  Cholelithiasis noted but patient has no right upper quadrant abdominal pain.  Also showed diverticulosis without diverticulitis.  Given her melena as well as hemoglobin drop, she will need to be admitted for further management and GI evaluation. Consulted hospitalist who will evaluate pt and admit her. She is admitted.   Clinical Course as of 11/21/23 1718  Tue Nov 21, 2023  1628 Independent review of labs, UA not consistent with UTI, pregnancy test is negative, lipase is not elevated, LFTs are not elevated, electrolytes not severely deranged, creatinine is normal.  No leukocytosis, her hemoglobin is today which is down from 8.9 on previous check. [TT]  1653 CT ABDOMEN PELVIS W CONTRAST IMPRESSION:  1. No acute process within the abdomen or pelvis.  2. Cholelithiasis without CT evidence for acute cholecystitis.  3. Sigmoid colonic diverticulosis  without CT evidence for acute  diverticulitis.  4. Redemonstrated enlarged fibroid uterus.   [TT]    Clinical Course User Index [TT] Jodie Echevaria Franchot Erichsen, MD     FINAL CLINICAL IMPRESSION(S) / ED DIAGNOSES   Final diagnoses:  Lower abdominal pain  Right-sided chest pain  Flank pain  Melena  Anemia,  unspecified type     Rx / DC Orders   ED Discharge Orders     None        Note:  This document was prepared using Dragon voice recognition software and may include unintentional dictation errors.    Claybon Jabs, MD 11/21/23 1707    Claybon Jabs, MD 11/21/23 [phone]

## 2023-11-21 NOTE — ED Triage Notes (Signed)
 Pt here with ongoing abd pain. Pt states pain is more on her right side and now is radiating to her back. Pt denies NVD. Pt states she has had black stools for 3-4 days. Pt states pain is sharp and is constant. Pt ambulatory to triage.

## 2023-11-21 NOTE — ED Provider Triage Note (Signed)
 Emergency Medicine Provider Triage Evaluation Note  VIHA KRIEGEL , a 43 y.o. female  was evaluated in triage.  Pt complains of abdominal pain, black stools, history of blood transfusions, use of Maalox without any relief.  Review of Systems  Positive:  Negative:   Physical Exam  BP 118/84   Pulse 78   Temp 98.4 F (36.9 C)   Resp 16   Ht 5\' 8"  (1.727 m)   Wt 80.1 kg   LMP 10/22/2023   SpO2 98%   BMI 26.85 kg/m  Gen:   Awake, no distress   Resp:  Normal effort  MSK:   Moves extremities without difficulty  Other:    Medical Decision Making  Medically screening exam initiated at 11:47 AM.  Appropriate orders placed.  Tiney D Iwai was informed that the remainder of the evaluation will be completed by another provider, this initial triage assessment does not replace that evaluation, and the importance of remaining in the ED until their evaluation is complete.     Faythe Ghee, PA-C 11/21/23 1147

## 2023-11-21 NOTE — H&P (Addendum)
 History and Physical    Carolyn [name]:[id number] DOB: 1981/01/28 DOA: 11/21/2023  PCP: Patient, No Pcp Per  Patient coming from: home   Chief Complaint: melena  HPI: Carolyn [name] is a 43 y.o. female with medical history significant for history gastric ulcer, menorrhagia, iron deficiency anemia, presenting with the above complaint.  Has been bothered by right shoulder and right sided chest pain for several weeks. Began taking both ibuprofen and naproxen together several times a day for the past several weeks. Previously prescribed a ppi but hasn't been taking it. Didn't go for outpt colonoscopy advised after November 2023 admission for GI bleed. Has heavy periods, last period was last month, no current vaginal bleeding. Began noticing black stool about a week ago, every time she stools. Intermittent epigastric abd pain, none current. No emesis. No lightheadedness.   Review of Systems: As per HPI otherwise 10 point review of systems negative.    Past Medical History:  Diagnosis Date   Anemia    Fibroids    GERD (gastroesophageal reflux disease)    Sickle cell trait (HCC)    Ulcer of abdomen wall (HCC)     Past Surgical History:  Procedure Laterality Date   ESOPHAGOGASTRODUODENOSCOPY (EGD) WITH PROPOFOL N/A 02/26/2016   Procedure: ESOPHAGOGASTRODUODENOSCOPY (EGD) WITH PROPOFOL;  Surgeon: [name], MD;  Location: [facility] END[facility]COPY;  Service: Endoscopy;  Laterality: N/A;   ESOPHAGOGASTRODUODENOSCOPY (EGD) WITH PROPOFOL N/A 08/22/2022   Procedure: ESOPHAGOGASTRODUODENOSCOPY (EGD) WITH PROPOFOL;  Surgeon: Toledo, Boykin Nearing, MD;  Location: [facility] ENDOSCOPY;  Service: Gastroenterology;  Laterality: N/A;   WISDOM TOOTH EXTRACTION Bilateral      reports that she quit smoking about 14 months ago. Her smoking use included cigarettes. She started smoking about 27 years ago. She has a 52 pack-year smoking history. She has never used smokeless tobacco. She reports that she does not  currently use alcohol. She reports that she does not currently use drugs after having used the following drugs: Marijuana.  No Known Allergies  Family History  Problem Relation Age of Onset   Diabetes Mother    Hypertension Mother    Lung cancer Father    Leukemia Father    Lung cancer Maternal Grandmother    Colon cancer Maternal Grandfather    Cancer Paternal Grandmother    Cancer Paternal Grandfather     Prior to Admission medications   Medication Sig Start Date End Date Taking? Authorizing Provider  nicotine (NICODERM CQ - DOSED IN MG/24 HOURS) 21 mg/24hr patch One patch chest wall daily (okay to substitute generic) 08/23/22  Yes Wieting, Richard, MD  dicyclomine (BENTYL) 20 MG tablet Take 1 tablet (20 mg total) by mouth every 6 (six) hours as needed for spasms. Patient not taking: Reported on 11/21/2023 08/29/23   Creig Hines, MD  ferrous sulfate 325 (65 FE) MG EC tablet Take 1 tablet (325 mg total) by mouth 2 (two) times daily. Patient not taking: Reported on 11/21/2023 07/19/23 07/18/24  Minna Antis, MD  pantoprazole (PROTONIX) 40 MG tablet Take 1 tablet (40 mg total) by mouth daily. Patient not taking: Reported on 11/21/2023 08/23/22   Alford Highland, MD  sucralfate (CARAFATE) 1 g tablet Take 1 tablet (1 g total) by mouth 4 (four) times daily for 15 days. 07/19/23 08/03/23  Minna Antis, MD    Physical Exam: Vitals:   11/21/23 1142 11/21/23 1143 11/21/23 1703  BP:  118/84 127/79  Pulse: 78  80  Resp: 16  17  Temp: 98.4 F (  36.9 C)  98.2 F (36.8 C)  TempSrc:   Oral  SpO2: 98%  100%  Weight: 80.1 kg    Height: 5\' 8"  (1.727 m)      Constitutional: No acute distress Head: Atraumatic Eyes: Conjunctiva clear ENM: Moist mucous membranes.  Neck: Supple Respiratory: Clear to auscultation bilaterally, no wheezing/rales/rhonchi.  . Cardiovascular: Regular rate and rhythm. No murmurs/rubs/gallops. Abdomen: Non-tender, non-distended.   Musculoskeletal: No  joint deformity upper and lower extremities.   Skin: No rashes, lesions, or ulcers.  Extremities: No peripheral edema. Palpable peripheral pulses. Neurologic: Alert, moving all 4 extremities. Psychiatric: Normal insight and judgement.   Labs on Admission: I have personally reviewed following labs and imaging studies  CBC: Recent Labs  Lab 11/21/23 1148  WBC 6.9  HGB 7.6*  HCT 25.8*  MCV 73.3*  PLT 232   Basic Metabolic Panel: Recent Labs  Lab 11/21/23 1148  NA 138  K 3.7  CL 106  CO2 23  GLUCOSE 117*  BUN 13  CREATININE 0.68  CALCIUM 8.4*   GFR: Estimated Creatinine Clearance: 103.9 mL/min (by C-G formula based on SCr of 0.68 mg/dL). Liver Function Tests: Recent Labs  Lab 11/21/23 1148  AST 16  ALT 11  ALKPHOS 41  BILITOT 0.4  PROT 6.9  ALBUMIN 3.6   Recent Labs  Lab 11/21/23 1148  LIPASE 44   No results for input(s): "AMMONIA" in the last 168 hours. Coagulation Profile: No results for input(s): "INR", "PROTIME" in the last 168 hours. Cardiac Enzymes: No results for input(s): "CKTOTAL", "CKMB", "CKMBINDEX", "TROPONINI" in the last 168 hours. BNP (last 3 results) No results for input(s): "PROBNP" in the last 8760 hours. HbA1C: No results for input(s): "HGBA1C" in the last 72 hours. CBG: No results for input(s): "GLUCAP" in the last 168 hours. Lipid Profile: No results for input(s): "CHOL", "HDL", "LDLCALC", "TRIG", "CHOLHDL", "LDLDIRECT" in the last 72 hours. Thyroid Function Tests: No results for input(s): "TSH", "T4TOTAL", "FREET4", "T3FREE", "THYROIDAB" in the last 72 hours. Anemia Panel: No results for input(s): "VITAMINB12", "FOLATE", "FERRITIN", "TIBC", "IRON", "RETICCTPCT" in the last 72 hours. Urine analysis:    Component Value Date/Time   COLORURINE YELLOW (A) 11/21/2023 1148   APPEARANCEUR HAZY (A) 11/21/2023 1148   LABSPEC 1.023 11/21/2023 1148   PHURINE 5.0 11/21/2023 1148   GLUCOSEU NEGATIVE 11/21/2023 1148   HGBUR SMALL (A)  11/21/2023 1148   BILIRUBINUR NEGATIVE 11/21/2023 1148   KETONESUR NEGATIVE 11/21/2023 1148   PROTEINUR NEGATIVE 11/21/2023 1148   NITRITE NEGATIVE 11/21/2023 1148   LEUKOCYTESUR NEGATIVE 11/21/2023 1148    Radiological Exams on Admission: CT ABDOMEN PELVIS W CONTRAST Result Date: 11/21/2023 CLINICAL DATA:  Right lower quadrant abdominal pain EXAM: CT ABDOMEN AND PELVIS WITH CONTRAST TECHNIQUE: Multidetector CT imaging of the abdomen and pelvis was performed using the standard protocol following bolus administration of intravenous contrast. RADIATION DOSE REDUCTION: This exam was performed according to the departmental dose-optimization program which includes automated exposure control, adjustment of the mA and/or kV according to patient size and/or use of iterative reconstruction technique. CONTRAST:  OMNIPAQUE IOHEXOL 300 MG/ML  SOLN COMPARISON:  CT abdomen pelvis 07/19/2023 FINDINGS: Lower chest: Normal heart size. Lung bases are clear. No pleural effusion. Hepatobiliary: The liver is normal in size and contour. No focal lesion. Cholelithiasis. No gallbladder wall thickening or pericholecystic fluid. Pancreas: Unremarkable Spleen: Unremarkable Adrenals/Urinary Tract: Normal adrenal glands. Symmetric renal enhancement. No renal mass. No hydronephrosis. Urinary bladder is decompressed. Stomach/Bowel: Sigmoid colonic diverticulosis. No CT evidence  for acute diverticulitis. Normal appendix. No evidence for bowel obstruction. No free fluid or free intraperitoneal air. Small hiatal hernia. Normal morphology of the stomach. Vascular/Lymphatic: Normal caliber abdominal aorta. No retroperitoneal lymphadenopathy. 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 Reproductive: Redemonstrated enlarged fibroid uterus. Other: None. Musculoskeletal: Osseous structures unremarkable. IMPRESSION: 1. No acute process within the abdomen or pelvis. 2. Cholelithiasis without CT evidence for acute cholecystitis. 3. Sigmoid colonic  diverticulosis without CT evidence for acute diverticulitis. 4. Redemonstrated enlarged fibroid uterus. Electronically Signed   By: Annia Belt M.D.   On: 11/21/2023 16:48    EKG: Independently reviewed. nsr  Assessment/Plan Principal Problem:   Melena Active Problems:   Iron deficiency anemia due to chronic blood loss   History of gastric ulcer   Right-sided chest pain   # Melena History gastric ulcer though only gastritis seen on most recent egd from 2023. Now with a week of melena in the setting of excessive nsaid use. Hgb in the 7s from baseline in the 8s. Hemodynamically stable, not particularly symptomatic. Not pregnant. CTA of abdomen/pelvis unremarkable - typed and screened - clears for now, npo at midnight - continue IV PPI - I have sent a message to dr. Mia Creek of GI to inquire whether GI would like to plan for endoscopy tomorrow  # Iron deficiency anemia 2/2 menorrhagia, above GI bleed also contributing. Most recent hgbs in the 8s, here 7s. Follows with hematology. Asymptomatic - trend for now - consider iron infusion prior to discharge if she doesn't receive blood - will need outpt OB and hematology f/u  # Right chest pain Pain along right side of chest/flank. Nothing acute seen on ct of abdomen. EKG unremarkable, troponin neg and has been present for a week so not acs. Probably msk - f/u cxr  # Tobacco abuse - patch  DVT prophylaxis: SCDs Code Status: full  Family Communication: sig-o updated @ bedside  Consults called: GI   Level of care: Med-Surg Status is: Inpatient Remains inpatient appropriate because: severity of illness    Silvano Bilis MD [facility] Pager [phone]  If 7PM-7AM, please contact night-coverage [email or web address] Password [email or web address]  11/21/2023, 5:20 PM

## 2023-11-22 ENCOUNTER — Inpatient Hospital Stay: Payer: 59 | Admitting: Anesthesiology

## 2023-11-22 ENCOUNTER — Encounter
Admission: EM | Disposition: A | Discharge status: Home / Self Care | Payer: Self-pay | Source: Home / Self Care | Attending: Internal Medicine

## 2023-11-22 ENCOUNTER — Encounter: Payer: Self-pay | Admitting: Obstetrics and Gynecology

## 2023-11-22 DIAGNOSIS — K3189 Other diseases of stomach and duodenum: Secondary | ICD-10-CM | POA: Diagnosis not present

## 2023-11-22 DIAGNOSIS — Z8711 Personal history of peptic ulcer disease: Secondary | ICD-10-CM | POA: Diagnosis not present

## 2023-11-22 DIAGNOSIS — K2211 Ulcer of esophagus with bleeding: Secondary | ICD-10-CM

## 2023-11-22 DIAGNOSIS — K921 Melena: Secondary | ICD-10-CM | POA: Diagnosis not present

## 2023-11-22 DIAGNOSIS — D500 Iron deficiency anemia secondary to blood loss (chronic): Secondary | ICD-10-CM

## 2023-11-22 HISTORY — PX: BIOPSY: SHX5522

## 2023-11-22 HISTORY — PX: HEMOSTASIS CLIP PLACEMENT: SHX6857

## 2023-11-22 HISTORY — PX: ESOPHAGOGASTRODUODENOSCOPY: SHX5428

## 2023-11-22 LAB — PROTIME-INR
INR: 1.1 (ref 0.8–1.2)
Prothrombin Time: 14 s (ref 11.4–15.2)

## 2023-11-22 LAB — CBC
HCT: 23.9 % — ABNORMAL LOW (ref 36.0–46.0)
Hemoglobin: 7.2 g/dL — ABNORMAL LOW (ref 12.0–15.0)
MCH: 21.6 pg — ABNORMAL LOW (ref 26.0–34.0)
MCHC: 30.1 g/dL (ref 30.0–36.0)
MCV: 71.6 fL — ABNORMAL LOW (ref 80.0–100.0)
Platelets: 230 10*3/uL (ref 150–400)
RBC: 3.34 MIL/uL — ABNORMAL LOW (ref 3.87–5.11)
RDW: 21.2 % — ABNORMAL HIGH (ref 11.5–15.5)
WBC: 5.7 10*3/uL (ref 4.0–10.5)
nRBC: 0 % (ref 0.0–0.2)

## 2023-11-22 LAB — BASIC METABOLIC PANEL
Anion gap: 6 (ref 5–15)
BUN: 8 mg/dL (ref 6–20)
CO2: 25 mmol/L (ref 22–32)
Calcium: 8.4 mg/dL — ABNORMAL LOW (ref 8.9–10.3)
Chloride: 108 mmol/L (ref 98–111)
Creatinine, Ser: 0.65 mg/dL (ref 0.44–1.00)
GFR, Estimated: >60 mL/min (ref >60)
Glucose, Bld: 95 mg/dL (ref 70–99)
Potassium: 3.4 mmol/L — ABNORMAL LOW (ref 3.5–5.1)
Sodium: 139 mmol/L (ref 135–145)

## 2023-11-22 LAB — HIV ANTIBODY (ROUTINE TESTING W REFLEX): HIV Screen 4th Generation wRfx: NONREACTIVE

## 2023-11-22 SURGERY — EGD (ESOPHAGOGASTRODUODENOSCOPY)
Anesthesia: General

## 2023-11-22 MED ORDER — MORPHINE SULFATE (PF) 2 MG/ML IV SOLN: 1.0000 mg | INTRAVENOUS | Status: DC | PRN

## 2023-11-22 MED ORDER — PROPOFOL 500 MG/50ML IV EMUL
INTRAVENOUS | Status: DC | PRN
  Administered 2023-11-22: 200 ug/kg/min via INTRAVENOUS

## 2023-11-22 MED ORDER — LIDOCAINE HCL (CARDIAC) PF 100 MG/5ML IV SOSY
PREFILLED_SYRINGE | INTRAVENOUS | Status: DC | PRN
  Administered 2023-11-22: 40 mg via INTRAVENOUS

## 2023-11-22 MED ORDER — PROPOFOL 10 MG/ML IV BOLUS
INTRAVENOUS | Status: DC | PRN
  Administered 2023-11-22: 100 mg via INTRAVENOUS

## 2023-11-22 MED ORDER — OXYCODONE-ACETAMINOPHEN 5-325 MG PO TABS
1.0000 | ORAL_TABLET | Freq: Four times a day (QID) | ORAL | Status: DC | PRN
  Administered 2023-11-22 – 2023-11-24 (×3): 1 via ORAL
  Filled 2023-11-22 (×4): qty 1

## 2023-11-22 MED ORDER — POTASSIUM CHLORIDE CRYS ER 20 MEQ PO TBCR
20.0000 meq | EXTENDED_RELEASE_TABLET | Freq: Once | ORAL | Status: AC
  Administered 2023-11-22: 20 meq via ORAL
  Filled 2023-11-22 (×2): qty 1

## 2023-11-22 NOTE — Consult Note (Signed)
 [name], MD [facility]  [address], [address] [place], [place] [address] Phone: [phone] Fax : [phone]  Consultation  Referring Provider:     Dr. Ashok Pall Primary Care Physician:  Patient, No Pcp Per Primary Gastroenterologist:  Dr. Norma Fredrickson         Reason for Consultation:     Blood loss anemia.  Date of Admission:  11/21/2023 Date of Consultation:  11/22/2023         HPI:   Carolyn [name] is a 43 y.o. female who has a history of taking NSAIDs in the past and had an upper endoscopy by me back in 2017 while taking meloxicam and having black stools.  Patient then had a repeat upper endoscopy in 2023 by Dr. Norma Fredrickson.  At that time the patient was being evaluated for iron deficient anemia and was recommended to have a colonoscopy as an outpatient.  The patient never followed up with a colonoscopy as an outpatient.  The patient now comes in with a history of heavy menses with iron deficiency anemia and a history of gastric ulcers.  The patient has been having some right sided chest and shoulder pain and began taking ibuprofen and Naprosyn several times a day for the past few weeks.  The patient had previously been treated with a PPI.  The last procedure by Dr. Norma Fredrickson was in 2023.  The patient does endorse some epigastric pain. The patient had been found to have a hemoglobin of 7.6 on admission with her trends of hemoglobin showing:  Component     Latest Ref Rng 07/19/2023 07/28/2023 08/25/2023 11/21/2023 11/22/2023  Hemoglobin     12.0 - 15.0 g/dL 6.8 (L)  8.0 (L)  8.9 (L)  7.6 (L)  7.2 (L)   HCT     36.0 - 46.0 % 25.4 (L)  28.1 (L)  29.9 (L)  25.8 (L)  23.9 (L)    There is no report of any previous colonoscopies.  Past Medical History:  Diagnosis Date   Anemia    Fibroids    GERD (gastroesophageal reflux disease)    Sickle cell trait (HCC)    Ulcer of abdomen wall (HCC)     Past Surgical History:  Procedure Laterality Date   ESOPHAGOGASTRODUODENOSCOPY (EGD) WITH PROPOFOL  N/A 02/26/2016   Procedure: ESOPHAGOGASTRODUODENOSCOPY (EGD) WITH PROPOFOL;  Surgeon: [name], MD;  Location: [facility] END[facility]COPY;  Service: Endoscopy;  Laterality: N/A;   ESOPHAGOGASTRODUODENOSCOPY (EGD) WITH PROPOFOL N/A 08/22/2022   Procedure: ESOPHAGOGASTRODUODENOSCOPY (EGD) WITH PROPOFOL;  Surgeon: Toledo, Boykin Nearing, MD;  Location: [facility] ENDOSCOPY;  Service: Gastroenterology;  Laterality: N/A;   WISDOM TOOTH EXTRACTION Bilateral     Prior to Admission medications   Medication Sig Start Date End Date Taking? Authorizing Provider  nicotine (NICODERM CQ - DOSED IN MG/24 HOURS) 21 mg/24hr patch One patch chest wall daily (okay to substitute generic) 08/23/22  Yes Wieting, Richard, MD  dicyclomine (BENTYL) 20 MG tablet Take 1 tablet (20 mg total) by mouth every 6 (six) hours as needed for spasms. Patient not taking: Reported on 11/21/2023 08/29/23   Creig Hines, MD  ferrous sulfate 325 (65 FE) MG EC tablet Take 1 tablet (325 mg total) by mouth 2 (two) times daily. Patient not taking: Reported on 11/21/2023 07/19/23 07/18/24  Minna Antis, MD  pantoprazole (PROTONIX) 40 MG tablet Take 1 tablet (40 mg total) by mouth daily. Patient not taking: Reported on 11/21/2023 08/23/22   Alford Highland, MD  sucralfate (CARAFATE)  1 g tablet Take 1 tablet (1 g total) by mouth 4 (four) times daily for 15 days. 07/19/23 08/03/23  Minna Antis, MD    Family History  Problem Relation Age of Onset   Diabetes Mother    Hypertension Mother    Lung cancer Father    Leukemia Father    Lung cancer Maternal Grandmother    Colon cancer Maternal Grandfather    Cancer Paternal Grandmother    Cancer Paternal Grandfather      Social History   Tobacco Use   Smoking status: Former    Current packs/day: 0.00    Average packs/day: 2.0 packs/day for 26.0 years (52.0 ttl pk-yrs)    Types: Cigarettes    Start date: 08/24/1996    Quit date: 08/24/2022    Years since quitting: 1.2   Smokeless tobacco:  Never  Vaping Use   Vaping status: Never Used  Substance Use Topics   Alcohol use: Yes    Comment: half pint once a week   Drug use: Not Currently    Types: Marijuana    Allergies as of 11/21/2023   (No Known Allergies)    Review of Systems:    All systems reviewed and negative except where noted in HPI.   Physical Exam:  Vital signs in last 24 hours: Temp:  [98 F (36.7 C)-98.4 F (36.9 C)] 98.4 F (36.9 C) (02/19 0847) Pulse Rate:  [71-80] 71 (02/19 0847) Resp:  [11-17] 12 (02/19 0847) BP: (115-133)/(73-86) 133/86 (02/19 0847) SpO2:  [98 %-100 %] 100 % (02/19 0847) Weight:  [80.1 kg] 80.1 kg (02/18 1142) Last BM Date : 11/19/23 General:   Pleasant, cooperative in NAD Head:  Normocephalic and atraumatic. Eyes:   No icterus.   Conjunctiva pink. PERRLA. Ears:  Normal auditory acuity. Neck:  Supple; no masses or thyroidomegaly Lungs: Respirations even and unlabored. Lungs clear to auscultation bilaterally.   No wheezes, crackles, or rhonchi.  Heart:  Regular rate and rhythm;  Without murmur, clicks, rubs or gallops Abdomen:  Soft, nondistended, nontender. Normal bowel sounds. No appreciable masses or hepatomegaly.  No rebound or guarding.  Rectal:  Not performed. Msk:  Symmetrical without gross deformities.    Extremities:  Without edema, cyanosis or clubbing. Neurologic:  Alert and oriented x3;  grossly normal neurologically. Skin:  Intact without significant lesions or rashes. Cervical Nodes:  No significant cervical adenopathy. Psych:  Alert and cooperative. Normal affect.  LAB RESULTS: Recent Labs    11/21/23 1148 11/22/23 0428  WBC 6.9 5.7  HGB 7.6* 7.2*  HCT 25.8* 23.9*  PLT 232 230   BMET Recent Labs    11/21/23 1148 11/22/23 0428  NA 138 139  K 3.7 3.4*  CL 106 108  CO2 23 25  GLUCOSE 117* 95  BUN 13 8  CREATININE 0.68 0.65  CALCIUM 8.4* 8.4*   LFT Recent Labs    11/21/23 1148  PROT 6.9  ALBUMIN 3.6  AST 16  ALT 11  ALKPHOS 41   BILITOT 0.4   PT/INR Recent Labs    11/22/23 0428  LABPROT 14.0  INR 1.1    STUDIES: DG Chest 2 View Result Date: 11/21/2023 CLINICAL DATA:  Chest pain. EXAM: CHEST - 2 VIEW COMPARISON:  August 21, 2022. FINDINGS: The heart size and mediastinal contours are within normal limits. Both lungs are clear. The visualized skeletal structures are unremarkable. IMPRESSION: No active cardiopulmonary disease. Electronically Signed   By: Lupita Raider M.D.   On: 11/21/2023  18:22   CT ABDOMEN PELVIS W CONTRAST Result Date: 11/21/2023 CLINICAL DATA:  Right lower quadrant abdominal pain EXAM: CT ABDOMEN AND PELVIS WITH CONTRAST TECHNIQUE: Multidetector CT imaging of the abdomen and pelvis was performed using the standard protocol following bolus administration of intravenous contrast. RADIATION DOSE REDUCTION: This exam was performed according to the departmental dose-optimization program which includes automated exposure control, adjustment of the mA and/or kV according to patient size and/or use of iterative reconstruction technique. CONTRAST:  OMNIPAQUE IOHEXOL 300 MG/ML  SOLN COMPARISON:  CT abdomen pelvis 07/19/2023 FINDINGS: Lower chest: Normal heart size. Lung bases are clear. No pleural effusion. Hepatobiliary: The liver is normal in size and contour. No focal lesion. Cholelithiasis. No gallbladder wall thickening or pericholecystic fluid. Pancreas: Unremarkable Spleen: Unremarkable Adrenals/Urinary Tract: Normal adrenal glands. Symmetric renal enhancement. No renal mass. No hydronephrosis. Urinary bladder is decompressed. Stomach/Bowel: Sigmoid colonic diverticulosis. No CT evidence for acute diverticulitis. Normal appendix. No evidence for bowel obstruction. No free fluid or free intraperitoneal air. Small hiatal hernia. Normal morphology of the stomach. Vascular/Lymphatic: Normal caliber abdominal aorta. No retroperitoneal lymphadenopathy. 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 Reproductive:  Redemonstrated enlarged fibroid uterus. Other: None. Musculoskeletal: Osseous structures unremarkable. IMPRESSION: 1. No acute process within the abdomen or pelvis. 2. Cholelithiasis without CT evidence for acute cholecystitis. 3. Sigmoid colonic diverticulosis without CT evidence for acute diverticulitis. 4. Redemonstrated enlarged fibroid uterus. Electronically Signed   By: Annia Belt M.D.   On: 11/21/2023 16:48      Impression / Plan:   Assessment: Principal Problem:   Melena Active Problems:   Iron deficiency anemia due to chronic blood loss   History of gastric ulcer   Right-sided chest pain   Indica D Boss is a 43 y.o. y/o female with a history of peptic ulcer disease and recently taking NSAIDs.  The patient has been having some dark stools and has a history of heavy periods during her menstrual cycles.  The patient has had 2 upper endoscopies for similar issues with the use of NSAIDs.  Plan:  The patient has been kept n.p.o. and will be set up for an EGD for today.  The patient has been told that she needs to stay away from anti-inflammatory medications.  The patient has been explained the plan and agrees with it.  PPI IV twice daily  Continue serial CBCs and transfuse PRN Avoid NSAIDs Maintain 2 large-bore IV lines Please page GI with any acute hemodynamic changes, or signs of active GI bleeding   Thank you for involving me in the care of this patient.      LOS: 1 day   [name], MD, MD. Clementeen Graham 11/22/2023, 9:15 AM,  Pager [phone] 7am-5pm  Check AMION for 5pm -7am coverage and on weekends   Note: This dictation was prepared with Dragon dictation along with smaller phrase technology. Any transcriptional errors that result from this process are unintentional.

## 2023-11-22 NOTE — Discharge Instructions (Signed)
 Transportation Resources  Agency Name: [facility] Agency Address: [address] [place], [place] [address] Phone: [phone] Email: [email or web address] Website: [email or web address] Service(s) Offered: Housing services, self-sufficiency, congregate meal program, weatherization program, Field seismologist program, emergency food assistance,  housing counseling, home ownership program, wheels-towork program.  Agency Name: [facility] [employer] [phone]) Address: [address], [place], [place] [address] Phone: [phone] Website: [email or web address] Service(s) Offered: Transportation for [employer], subscription and demand response; Dial-a-Ride for citizens 41 years of age or older.  Agency Name: Department of Social Services Address: [address] [place], [place] [address] Phone: [phone] Service(s) Offered: Child support services; child welfare services; food stamps; Medicaid; work first family assistance; and aid with fuel,  rent, food and medicine, transportation assistance.  Agency Name: Disabled [employer] (DAV) Transportation  Network Phone: [phone] Service(s) Offered: Transports veterans to the [facility] center. Call  forty-eight hours in advance and leave the name, telephone  number, date, and time of appointment. Veteran will be  contacted by the driver the day before the appointment to  arrange a pick up point   Transportation Resources  Agency Name: [facility] Agency Address: [address] [place], [place] [address] Phone: [phone] Email: [email or web address] Website: [email or web address] Service(s) Offered: Housing services, self-sufficiency, congregate meal program, weatherization program, Field seismologist program, emergency food assistance,  housing counseling, home ownership program, wheels-towork  program.  Agency Name: [facility] [employer] [phone]) Address: [address], [place], [place] [address] Phone: [phone] Website: [email or web address] Service(s) Offered: Transportation for [employer], subscription and demand response; Dial-a-Ride for citizens 41 years of age or older.  Agency Name: Department of Social Services Address: [address] [place], [place] [address] Phone: [phone] Service(s) Offered: Child support services; child welfare services; food stamps; Medicaid; work first family assistance; and aid with fuel,  rent, food and medicine, transportation assistance.  Agency Name: Disabled [employer] (DAV) Transportation  Network Phone: [phone] Service(s) Offered: Transports veterans to the [facility] medical center. Call  forty-eight hours in advance and leave the name, telephone  number, date, and time of appointment. Veteran will be  contacted by the driver the day before the appointment to  arrange a pick up point    [employer] ACTA currently provides door to door services. ACTA connects with PART daily for services to [facility]. ACTA also performs contract services to [employer] operates 27 vehicles, all but 3 mini-vans are equipped with lifts for special needs as well as the general public. ACTA drivers are each CDL certified and trained in First Aid and CPR. ACTA was established in 2002 by [employer]. An independent Industrial/product designer. ACTA operates via Cytogeneticist with required Research scientist (physical sciences) from [place]. ACTA provides over 80,000 passenger trips each year, including [facility] and [employer] sites.  Call at least by 11 AM one business day prior to needing transportation  [employer].                      [place], [place] [address]     Office  Hours: Monday-Friday  8 AM - 5 PM  Rent/Utility/Housing  Agency Name: [facility] Agency Address: [address] [place], [place] [address] Phone: [phone] Email: [email or web address] Website: [email or web address] Service(s) Offered: Housing services, self-sufficiency, congregate meal program, weatherization program, Field seismologist program, emergency food assistance,  housing counseling, home ownership program, wheels -towork program.  Agency Name: [facility] Address: [phone] [address], [place], [place] [address] Phone: [phone] (8a-4p) [phone] (8p- 10p) Email: [email or web address] Website: [email or web address] Service(s) Offered: A program for homeless and/or needy men that includes one-on-one counseling, life skills training and job rehabilitation.  Agency Name: [employer] [facility] [place] Address: [address], [place], [place] [address] Phone: [phone] Website: www.alliedchurches.org Service(s) Offered: Assistance to needy in emergency with utility bills, heating fuel, and prescriptions. Shelter for homeless 7pm-7am. January 26, 2017 15  Agency Name: [facility] [place] (Developmentally Disabled) Address: [address], [place], [place] [address] Phone: [phone] Contact Person: Cathleen Corti Email: [email or web address] Website: [email or web address] Service(s) Offered: Helps individuals with developmental disabilities move from housing that is more restrictive to homes where they  can achieve greater independence and have more  opportunities.  Agency Name: [facility] Address: [address], [place], [place] [address] Phone: [phone] Email: [email or web address] Website: www.[email or web address].org Service(s) Offered: Provides affordable housing for low-income families, elderly, and disabled individuals. Offer a wide range of  programs and  services, from financial planning to afterschool and summer programs.  Agency Name: Department of Social Services Address: [address] [place], [place] [address] Phone: [phone] Service(s) Offered: Child support services; child welfare services; food stamps; Medicaid; work first family assistance; and aid with fuel,  rent, food and medicine.  Agency Name: [facility] [place] [employer]. Address: [facility] [address] [place], [place]  [address] Phone: [phone] Website: www.familyabuseservices.org Service(s) Offered: 24 hour Crisis Line: [phone]; 24 hour Emergency Shelter; Transitional Housing; Support Groups; Scientist, physiological; [employer]; Hispanic Outreach: [phone];  Visitation Center: [phone].  Agency Name: [facility] [place]. Address: [address], [place], [place] [address] Phone: [phone] Service(s) Offered: CAP Services; Home and [employer]; Individual or Group Supports; Respite Care Non-Institutional Nursing;  Residential Supports; Respite Care and Personal Care Services; Transportation; Family and Friends Night; Recreational Activities; Three Nutritious Meals/Snacks; Consultation with Registered Dietician; Twenty-four hour Registered Nurse Access; Daily and [employer] and [employer]s; Camp Green Leaves; [place] for the [employer] (During Summer Months) Bingo Night (Every  Wednesday Night); Special Populations Dance Night  (Every Tuesday Night); Professional Hair Care Services.  Agency Name: [facility] Address: [address], [place], [place] [address] Phone: [phone] Contact Person: Jabier Mutton Website: [email or web address].Physicist, medical) Offered: Residential treatment facility for women; food and  clothing, educational & employment development and  transportation to work; Counsellor of financial skills;  parenting and family reunification;  emotional and spiritual  support; transitional housing for program graduates.  Agency Name: [facility] Address: [address] [place], [place] [address] Phone: [phone] Email: [email or web address] Website: TaskTown.es Service(s) Offered: Public housing units for elderly, disabled, and low income people; housing choice vouchers for income eligible  applicants; shelter plus care vouchers; and Psychologist, clinical.  Agency Name: [facility] [employer] Address: [address], [place], [place] [address] Phone: [phone] Email: [email or web address] Website: www.habitatalamance.org Service(s) Offered: Build houses for families in need of decent housing. Each adult in the family must invest 200 hours of labor on  someone else's house, work with volunteers to build their own house, attend classes on budgeting, home maintenance, yard care, and attend homeowner association meetings.  Agency Name: [facility] Address: [address] [place], [place] [address] Phone: [phone] Website: www.rsli.org Service(s) Offered: Intermediate care facilities for intellectually delayed, Supervised Living in  group homes for adults with developmental disabilities, Supervised Living for people who have dual diagnoses (MRMI), Independent Living, Supported Living, respite and a variety of CAP services, pre-vocational services, day supports, and [employer].  Agency Name: [place] [facility] Phone: [phone] Website: www.NCForeclosurePrevention.gov Service(s) Offered: Zero-interest, deferred loans to homeowners struggling to pay their mortgage. Call for more information. Food Resources  Agency Name: [facility] Agency Address: [address] [place], [place] [address] Phone: [phone] Website: www.alamanceservices.org Service(s) Offered: Housing services, self-sufficiency, congregate meal  program, weatherization program, Event organiser program, emergency food assistance,  housing counseling, home ownership program, wheels - to work program.  Dole Food free for 60 and older at various locations from [employer], Monday-Friday:  [employer], [address] [place], [phone] -[facility], [address] [place] [phone]  -[facility], [address] [place] [phone]  -[address] [place], [phone]  Agency Name: [facility] Address: [phone] [address], [place], [place] [address] Phone: [phone] Website: www.alamancemow.org Service(s) Offered: Home delivered hot, frozen, and emergency  meals. Grocery assistance program which matches  volunteers one-on-one with seniors unable to grocery shop  for themselves. Must be 60 years and older; less than 20  hours of in-home aide service, limited or no driving ability;  live alone or with someone with a disability; live in  [place].  Agency Name: [facility]) Address: [address] [place], [place] [address] Phone: [phone] Service(s) Offered: Food is served to shut-ins, homeless, elderly, and low income people in the community every Saturday (11:30 am-12:30 pm) and Sunday (12:30 pm-1:30pm). Volunteers also offer help and encouragement in seeking employment,  and spiritual guidance.  Agency Name: Department of Social Services Address: [address] [place], [place] [address] Phone: [phone] Service(s) Offered: Child support services; child welfare services; food stamps; Medicaid; work first family assistance; and aid with fuel,  rent, food and medicine.  Agency Name: [facility] Address: [address] [place], [place] Phone: [phone] Website: www.dreamalign.com Services Offered: Monday 10:00am-12:00, 8:00pm-9:00pm, and Friday  10:00am-12:00.  Agency Name: [facility] [place] Address: [address] [place], [place] [address] Phone: [phone] Website: www.alliedchurches.org Service(s) Offered: Serves weekday meals, open from 11:30 am- 1:00 pm., and 6:30-7:30pm, Monday-Wednesday-Friday distributes food 3:30-6pm, Monday-Wednesday-Friday.  Agency Name: [facility] Address: [address], [place], [place] Phone: [phone] Website: [email or web address].[email or web address].[email or web address] Services Offered: Distributes food the 4th Saturday of the month, starting at 8:00 am  Agency Name: [facility] Address: [phone] [address], [place], [place] [address] Phone: [phone] Website: [email or web address] Service(s) Offered: Bread of life, weekly food pantry. Open Wednesdays from 10:00am-noon.  Agency Name: [facility] [employer] [facility] Address: [address] [place], [place], [place] Phone: [phone] Services Offered: Distributes food 9am-1pm, Monday-Thursday. Call for details.  Agency Name: [facility] Address: [address], [place], [place] [address] Phone: [phone] Website: firstbaptistburlington.[email or web address] Service(s) Offered: Games developer. Call for assistance.  Agency Name: [facility] Address: [address], [place], [place] [address] Phone: [phone] Service Offered: Emergency Food Pantry. Call for appointment.  Agency Name: [facility] Address: [address], [place], [place] [address] Phone: [phone] Website: [email or web address] Services Offered: Games developer. Call for details  Agency Name: [facility] Address: [address] [place], [place] Phone: [phone] Website: [email or web address] Service(s) Offered: Emergency Food Pantry. Call for details.  Agency Name:  [facility] Address: [address] [place], [place] [address] Phone: [phone] or [phone] Website:  [email or web address] Service(s) Offered: Distribute food 9am-11:30 am, Tuesday-Friday, and 1-3:30pm, Monday-Friday. Food pantry Monday-Friday 1pm-3pm, fresh items, Mon.-Wed.-Fri.  Agency Name: [facility] Address: [address] [place], [place] [address] Phone: [phone] Website: www.safealamance.org Services Offered: Distribute food Tues and Sats from 9:00am-noon. Closed 1st Saturday of each month. Call for details  Agency Name: [facility] Address: [address]do [place] [facility] [address] [place] [address] Phone: [phone]  Services Offered: Delivers meals every Thursday Agency Name: [facility] Agency Address: [address], [place], [place] [address] Phone: [phone] Website: www.alamanceservices.org Service(s) Offered: Housing services, self-sufficiency, congregate meal program, and individual development account program.  Agency Name: [facility] [employer] [facility] [place] Address: [address] [place], [place] [address] Phone: [phone] Email: [email or web address] Website: www.alliedchurches.org Service(s) Offered: Housing the homeless, feeding the hungry, Company secretary, job and education related services.  Agency Name: [facility] Address: [address] [place], [place] [address] Phone: [phone] Email: [email or web address] Service(s) Offered: Counseling, problem pregnancy, advocacy for Hispanics, limited emergency financial assistance.  Agency Name: Department of Social Services Address: [address] [place], [place] [address] Phone: [phone] Website: [email or web address] Service(s) Offered: Child support services; child welfare services; SNAP; Medicaid; work first family assistance; and aid with fuel,  rent, food and medicine.  Agency Name: Holiday representative Address: [address], [place], [place] [address] Phone: [phone] or  [phone] Email: [email or web address] Service(s) Offered: Family services and transient assistance; emergency food, fuel, clothing, limited furniture, utilities; budget counseling, general counseling; give a kid a coat; thrift store; Christmas food and toys. Utility assistance, food pantry, rental  assistance, life sustaining medicine

## 2023-11-22 NOTE — Progress Notes (Signed)
   11/22/23 1500  Spiritual Encounters  Type of Visit Initial  Care provided to: Pt and family  Referral source Patient request  Reason for visit Advance directives  OnCall Visit No   Chaplain met with patient and discussed AD.  Documents left with patient per request.  Patient aware to notify staff when ready to complete and file.

## 2023-11-22 NOTE — Progress Notes (Signed)
 Attempted visit with pt-pt was off to a procedure when I arrived. Chaplain services will follow up with HCPOA when she returns to floor

## 2023-11-22 NOTE — Anesthesia Procedure Notes (Signed)
 Date/Time: 11/22/2023 11:41 AM  Performed by: Stormy Fabian, CRNAPre-anesthesia Checklist: Patient identified, Emergency Drugs available, Suction available and Patient being monitored Patient Re-evaluated:Patient Re-evaluated prior to induction Oxygen Delivery Method: Nasal cannula Induction Type: IV induction Dental Injury: Teeth and Oropharynx as per pre-operative assessment  Comments: Nasal cannula with etCO2 monitoring

## 2023-11-22 NOTE — Progress Notes (Signed)
 PROGRESS NOTE    MAHKAYLA PREECE  ZOX:[id number] DOB: January 04, 1981 DOA: 11/21/2023 PCP: Patient, No Pcp Per    Assessment & Plan:   Principal Problem:   Melena Active Problems:   Iron deficiency anemia due to chronic blood loss   History of gastric ulcer   Right-sided chest pain  Assessment and Plan: Melena: w/ hx of gastric ulcer. Was taking NSAIDs at home. EGD today as per GI. Continue on PPI. No NSAIDs.   IDA: w/ hx of menorrhagia. Will need to f/u outpatient w/ gyn    Right chest pain: likely MSK in etiology. EKG unremarkable. Neg troponin. CXR is WNL    Tobacco abuse: nicotine patch to prevent w/drawl. Smoking cessation counseling x 5 mins   Hypokalemia: potassium given     DVT prophylaxis: SCDs Code Status: full  Family Communication: Disposition Plan: d/c home  Level of care: Med-Surg  Status is: Inpatient Remains inpatient appropriate because: egd today     Consultants:  GI   Procedures:   Antimicrobials:    Subjective: Pt c/o malaise   Objective: Vitals:   11/21/23 1703 11/21/23 2000 11/21/23 2053 11/22/23 0437  BP: 127/79 115/80 118/80 130/73  Pulse: 80 77 78 80  Resp: 17 11 16 16   Temp: 98.2 F (36.8 C)  98.2 F (36.8 C) 98 F (36.7 C)  TempSrc: Oral   Oral  SpO2: 100% 100% 100% 100%  Weight:      Height:       No intake or output data in the 24 hours ending 11/22/23 0817 Filed Weights   11/21/23 1142  Weight: 80.1 kg    Examination:  General exam: Appears calm and comfortable  Respiratory system: Clear to auscultation. Respiratory effort normal. Cardiovascular system: S1 & S2+. No rubs, gallops or clicks.  Gastrointestinal system: Abdomen is nondistended, soft and nontender. Normal bowel sounds heard. Central nervous system: Alert and oriented. Moves all extremities  Psychiatry: Judgement and insight appear normal. Mood & affect appropriate.     Data Reviewed: I have personally reviewed following labs and imaging  studies  CBC: Recent Labs  Lab 11/21/23 1148 11/22/23 0428  WBC 6.9 5.7  HGB 7.6* 7.2*  HCT 25.8* 23.9*  MCV 73.3* 71.6*  PLT 232 230   Basic Metabolic Panel: Recent Labs  Lab 11/21/23 1148 11/22/23 0428  NA 138 139  K 3.7 3.4*  CL 106 108  CO2 23 25  GLUCOSE 117* 95  BUN 13 8  CREATININE 0.68 0.65  CALCIUM 8.4* 8.4*   GFR: Estimated Creatinine Clearance: 103.9 mL/min (by C-G formula based on SCr of 0.65 mg/dL). Liver Function Tests: Recent Labs  Lab 11/21/23 1148  AST 16  ALT 11  ALKPHOS 41  BILITOT 0.4  PROT 6.9  ALBUMIN 3.6   Recent Labs  Lab 11/21/23 1148  LIPASE 44   No results for input(s): "AMMONIA" in the last 168 hours. Coagulation Profile: Recent Labs  Lab 11/22/23 0428  INR 1.1   Cardiac Enzymes: No results for input(s): "CKTOTAL", "CKMB", "CKMBINDEX", "TROPONINI" in the last 168 hours. BNP (last 3 results) No results for input(s): "PROBNP" in the last 8760 hours. HbA1C: No results for input(s): "HGBA1C" in the last 72 hours. CBG: No results for input(s): "GLUCAP" in the last 168 hours. Lipid Profile: No results for input(s): "CHOL", "HDL", "LDLCALC", "TRIG", "CHOLHDL", "LDLDIRECT" in the last 72 hours. Thyroid Function Tests: No results for input(s): "TSH", "T4TOTAL", "FREET4", "T3FREE", "THYROIDAB" in the last 72 hours. Anemia Panel:  No results for input(s): "VITAMINB12", "FOLATE", "FERRITIN", "TIBC", "IRON", "RETICCTPCT" in the last 72 hours. Sepsis Labs: No results for input(s): "PROCALCITON", "LATICACIDVEN" in the last 168 hours.  No results found for this or any previous visit (from the past 240 hours).       Radiology Studies: DG Chest 2 View Result Date: 11/21/2023 CLINICAL DATA:  Chest pain. EXAM: CHEST - 2 VIEW COMPARISON:  August 21, 2022. FINDINGS: The heart size and mediastinal contours are within normal limits. Both lungs are clear. The visualized skeletal structures are unremarkable. IMPRESSION: No active  cardiopulmonary disease. Electronically Signed   By: Lupita Raider M.D.   On: 11/21/2023 18:22   CT ABDOMEN PELVIS W CONTRAST Result Date: 11/21/2023 CLINICAL DATA:  Right lower quadrant abdominal pain EXAM: CT ABDOMEN AND PELVIS WITH CONTRAST TECHNIQUE: Multidetector CT imaging of the abdomen and pelvis was performed using the standard protocol following bolus administration of intravenous contrast. RADIATION DOSE REDUCTION: This exam was performed according to the departmental dose-optimization program which includes automated exposure control, adjustment of the mA and/or kV according to patient size and/or use of iterative reconstruction technique. CONTRAST:  OMNIPAQUE IOHEXOL 300 MG/ML  SOLN COMPARISON:  CT abdomen pelvis 07/19/2023 FINDINGS: Lower chest: Normal heart size. Lung bases are clear. No pleural effusion. Hepatobiliary: The liver is normal in size and contour. No focal lesion. Cholelithiasis. No gallbladder wall thickening or pericholecystic fluid. Pancreas: Unremarkable Spleen: Unremarkable Adrenals/Urinary Tract: Normal adrenal glands. Symmetric renal enhancement. No renal mass. No hydronephrosis. Urinary bladder is decompressed. Stomach/Bowel: Sigmoid colonic diverticulosis. No CT evidence for acute diverticulitis. Normal appendix. No evidence for bowel obstruction. No free fluid or free intraperitoneal air. Small hiatal hernia. Normal morphology of the stomach. Vascular/Lymphatic: Normal caliber abdominal aorta. No retroperitoneal lymphadenopathy. 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 Reproductive: Redemonstrated enlarged fibroid uterus. Other: None. Musculoskeletal: Osseous structures unremarkable. IMPRESSION: 1. No acute process within the abdomen or pelvis. 2. Cholelithiasis without CT evidence for acute cholecystitis. 3. Sigmoid colonic diverticulosis without CT evidence for acute diverticulitis. 4. Redemonstrated enlarged fibroid uterus. Electronically Signed   By: Annia Belt M.D.   On:  11/21/2023 16:48        Scheduled Meds:  feeding supplement  1 Container Oral TID BM   influenza vac split trivalent PF  0.5 mL Intramuscular Tomorrow-1000   nicotine  21 mg Transdermal Daily   pantoprazole (PROTONIX) IV  40 mg Intravenous Q12H   sodium chloride flush  3 mL Intravenous Q12H   Continuous Infusions:  sodium chloride       LOS: 1 day      Charise Killian, MD [facility] Pager [phone]  If 7PM-7AM, please contact night-coverage [email or web address] 11/22/2023, 8:17 AM

## 2023-11-22 NOTE — TOC CM/SW Note (Signed)
 Transition of Care [facility]) - Inpatient Brief Assessment   Patient Details  Name: Carolyn [name] MRN: [id number] Date of Birth: June 13, 1981  Transition of Care [facility]) CM/SW Contact:    Chapman Fitch, RN Phone Number: 11/22/2023, 10:06 AM   Clinical Narrative:  No PCP on file List of local PCP added to AVS Utility, housing, food and transportation resources added to AVS Patient notified to have medicaid rx coverage benefits, patient would not be eligible for medication assistance with medicaid rx coverage   Transition of Care Asessment: Insurance and Status: Insurance coverage has been reviewed       Prior/Current Home Services: No current home services Social Drivers of Health Review: SDOH reviewed interventions complete Readmission risk has been reviewed: Yes Transition of care needs: no transition of care needs at this time

## 2023-11-22 NOTE — Anesthesia Postprocedure Evaluation (Signed)
 Anesthesia Post Note  Patient: Carolyn [name]  Procedure(s) Performed: ESOPHAGOGASTRODUODENOSCOPY (EGD) BIOPSY  Patient location during evaluation: PACU Anesthesia Type: General Level of consciousness: awake and awake and alert Pain management: satisfactory to patient Respiratory status: spontaneous breathing Cardiovascular status: blood pressure returned to baseline Anesthetic complications: no   No notable events documented.   Last Vitals:  Vitals:   11/22/23 1210 11/22/23 1241  BP:  (!) 131/93  Pulse: 76 81  Resp:  16  Temp:  36.7 C  SpO2:  100%    Last Pain:  Vitals:   11/22/23 1241  TempSrc: Oral  PainSc:                  [name],[name]

## 2023-11-22 NOTE — Anesthesia Preprocedure Evaluation (Signed)
 Anesthesia Evaluation  Patient identified by MRN, date of birth, ID band Patient awake    Reviewed: Allergy & Precautions, NPO status , Patient's Chart, lab work & pertinent test results  Airway Mallampati: II  TM Distance: >3 FB     Dental  (+) Teeth Intact   Pulmonary COPD, Patient abstained from smoking., former smoker   breath sounds clear to auscultation       Cardiovascular Exercise Tolerance: Good  Rhythm:Regular Rate:Normal     Neuro/Psych  negative psych ROS   GI/Hepatic Neg liver ROS,GERD  Controlled,,  Endo/Other  negative endocrine ROS    Renal/GU negative Renal ROS  negative genitourinary   Musculoskeletal   Abdominal Normal abdominal exam  (+)   Peds negative pediatric ROS (+)  Hematology  (+) Blood dyscrasia, anemia   Anesthesia Other Findings   Reproductive/Obstetrics                             Anesthesia Physical Anesthesia Plan  ASA: 2  Anesthesia Plan: General   Post-op Pain Management:    Induction: Intravenous  PONV Risk Score and Plan:   Airway Management Planned: Natural Airway and Nasal Cannula  Additional Equipment:   Intra-op Plan:   Post-operative Plan:   Informed Consent: I have reviewed the patients History and Physical, chart, labs and discussed the procedure including the risks, benefits and alternatives for the proposed anesthesia with the patient or authorized representative who has indicated his/her understanding and acceptance.       Plan Discussed with: CRNA  Anesthesia Plan Comments:        Anesthesia Quick Evaluation

## 2023-11-22 NOTE — Plan of Care (Signed)

## 2023-11-22 NOTE — Transfer of Care (Signed)
 Immediate Anesthesia Transfer of Care Note  Patient: Carolyn [name]  Procedure(s) Performed: Procedure(s): ESOPHAGOGASTRODUODENOSCOPY (EGD) (N/A)  Patient Location: PACU and Endoscopy Unit  Anesthesia Type:General  Level of Consciousness: sedated  Airway & Oxygen Therapy: Patient Spontanous Breathing and Patient connected to nasal cannula oxygen  Post-op Assessment: Report given to RN and Post -op Vital signs reviewed and stable  Post vital signs: Reviewed and stable  Last Vitals:  Vitals:   11/22/23 1036 11/22/23 1151  BP: 131/84 113/79  Pulse: 70   Resp: 16 17  Temp: (!) 36.3 C   SpO2: 100% 100%    Complications: No apparent anesthesia complications

## 2023-11-22 NOTE — Op Note (Signed)
 [facility] Gastroenterology Patient Name: Carolyn [name] Procedure Date: 11/22/2023 11:28 AM MRN: [id number] Account #: [id number] Date of Birth: 09-Aug-1981 Admit Type: Inpatient Age: 43 Room: [facility] ENDO ROOM 3 Gender: Female Note Status: Finalized Instrument Name: Upper Endoscope [id number] Procedure:             Upper GI endoscopy Indications:           Melena Providers:             [name] MD, MD Medicines:             Propofol per Anesthesia Complications:         No immediate complications. Procedure:             Pre-Anesthesia Assessment:                        - Prior to the procedure, a History and Physical was                         performed, and patient medications and allergies were                         reviewed. The patient's tolerance of previous                         anesthesia was also reviewed. The risks and benefits                         of the procedure and the sedation options and risks                         were discussed with the patient. All questions were                         answered, and informed consent was obtained. Prior                         Anticoagulants: The patient has taken no anticoagulant                         or antiplatelet agents. ASA Grade Assessment: II - A                         patient with mild systemic disease. After reviewing                         the risks and benefits, the patient was deemed in                         satisfactory condition to undergo the procedure.                        After obtaining informed consent, the endoscope was                         passed under direct vision. Throughout the procedure,                         the patient's blood pressure,  pulse, and oxygen                         saturations were monitored continuously. The Endoscope                         was introduced through the mouth, and advanced to the                         second part of duodenum. The  upper GI endoscopy was                         accomplished without difficulty. The patient tolerated                         the procedure well. Findings:      One superficial esophageal ulcer oozing blood was found at the       gastroesophageal junction. For hemostasis, one hemostatic clip was       successfully placed (MR conditional). Clip manufacturer: [employer]. There was no bleeding at the end of the procedure.      There was a thickened fold seen. This was biopsied with a cold forceps       for histology.      The examined duodenum was normal. Impression:            - Esophageal ulcer oozing blood. Clip (MR conditional)                         was placed. Clip manufacturer: [employer].                        - Normal examined duodenum. Recommendation:        - Return patient to hospital ward for ongoing care.                        - Resume previous diet.                        - Continue present medications.                        - Use a proton pump inhibitor PO daily.                        - No aspirin, ibuprofen, naproxen, or other                         non-steroidal anti-inflammatory drugs. Procedure Code(s):     --- Professional ---                        43255, 59, Esophagogastroduodenoscopy, flexible,                         transoral; with control of bleeding, any method                        43239, Esophagogastroduodenoscopy, flexible,  transoral; with biopsy, single or multiple Diagnosis Code(s):     --- Professional ---                        K92.1, Melena (includes Hematochezia)                        K22.11, Ulcer of esophagus with bleeding CPT copyright 2022 American Medical Association. All rights reserved. The codes documented in this report are preliminary and upon coder review may  be revised to meet current compliance requirements. [name] MD, MD 11/22/2023 11:46:15 AM This report has been signed  electronically. Number of Addenda: 0 Note Initiated On: 11/22/2023 11:28 AM Estimated Blood Loss:  Estimated blood loss: none.      [facility]

## 2023-11-23 ENCOUNTER — Encounter: Payer: Self-pay | Admitting: Gastroenterology

## 2023-11-23 DIAGNOSIS — D500 Iron deficiency anemia secondary to blood loss (chronic): Secondary | ICD-10-CM | POA: Diagnosis not present

## 2023-11-23 DIAGNOSIS — K921 Melena: Secondary | ICD-10-CM | POA: Diagnosis not present

## 2023-11-23 LAB — CBC
HCT: 22.6 % — ABNORMAL LOW (ref 36.0–46.0)
HCT: 26.4 % — ABNORMAL LOW (ref 36.0–46.0)
Hemoglobin: 6.8 g/dL — ABNORMAL LOW (ref 12.0–15.0)
Hemoglobin: 7.9 g/dL — ABNORMAL LOW (ref 12.0–15.0)
MCH: 21.4 pg — ABNORMAL LOW (ref 26.0–34.0)
MCH: 22.3 pg — ABNORMAL LOW (ref 26.0–34.0)
MCHC: 30.1 g/dL (ref 30.0–36.0)
MCHC: 29.9 g/dL — ABNORMAL LOW (ref 30.0–36.0)
MCV: 71.1 fL — ABNORMAL LOW (ref 80.0–100.0)
MCV: 74.6 fL — ABNORMAL LOW (ref 80.0–100.0)
Platelets: 226 10*3/uL (ref 150–400)
Platelets: 217 10*3/uL (ref 150–400)
RBC: 3.18 MIL/uL — ABNORMAL LOW (ref 3.87–5.11)
RBC: 3.54 MIL/uL — ABNORMAL LOW (ref 3.87–5.11)
RDW: 21.1 % — ABNORMAL HIGH (ref 11.5–15.5)
RDW: 21.3 % — ABNORMAL HIGH (ref 11.5–15.5)
WBC: 5.6 10*3/uL (ref 4.0–10.5)
WBC: 6.1 10*3/uL (ref 4.0–10.5)
nRBC: 0 % (ref 0.0–0.2)
nRBC: 0 % (ref 0.0–0.2)

## 2023-11-23 LAB — BASIC METABOLIC PANEL
Anion gap: 7 (ref 5–15)
BUN: 14 mg/dL (ref 6–20)
CO2: 26 mmol/L (ref 22–32)
Calcium: 8.5 mg/dL — ABNORMAL LOW (ref 8.9–10.3)
Chloride: 106 mmol/L (ref 98–111)
Creatinine, Ser: 0.66 mg/dL (ref 0.44–1.00)
GFR, Estimated: >60 mL/min (ref >60)
Glucose, Bld: 94 mg/dL (ref 70–99)
Potassium: 3.8 mmol/L (ref 3.5–5.1)
Sodium: 139 mmol/L (ref 135–145)

## 2023-11-23 LAB — SURGICAL PATHOLOGY

## 2023-11-23 LAB — IRON AND TIBC
Iron: 19 ug/dL — ABNORMAL LOW (ref 28–170)
Saturation Ratios: 4 % — ABNORMAL LOW (ref 10.4–31.8)
TIBC: 444 ug/dL (ref 250–450)
UIBC: 425 ug/dL

## 2023-11-23 LAB — FERRITIN: Ferritin: 3 ng/mL — ABNORMAL LOW (ref 11–307)

## 2023-11-23 LAB — PREPARE RBC (CROSSMATCH)

## 2023-11-23 LAB — FOLATE: Folate: 10.9 ng/mL (ref >5.9)

## 2023-11-23 LAB — VITAMIN B12: Vitamin B-12: 271 pg/mL (ref 180–914)

## 2023-11-23 MED ORDER — FERROUS GLUCONATE 324 (38 FE) MG PO TABS
324.0000 mg | ORAL_TABLET | Freq: Two times a day (BID) | ORAL | Status: DC
  Administered 2023-11-23 – 2023-11-24 (×2): 324 mg via ORAL
  Filled 2023-11-23 (×3): qty 1

## 2023-11-23 MED ORDER — GUAIFENESIN ER 600 MG PO TB12
600.0000 mg | ORAL_TABLET | Freq: Two times a day (BID) | ORAL | Status: DC
  Administered 2023-11-23 – 2023-11-24 (×3): 600 mg via ORAL
  Filled 2023-11-23 (×3): qty 1

## 2023-11-23 MED ORDER — ENSURE MAX PROTEIN PO LIQD
11.0000 [oz_av] | Freq: Two times a day (BID) | ORAL | Status: DC
  Administered 2023-11-23 – 2023-11-24 (×2): 11 [oz_av] via ORAL
  Filled 2023-11-23: qty 330

## 2023-11-23 MED ORDER — SODIUM CHLORIDE 0.9% IV SOLUTION: Freq: Once | INTRAVENOUS | Status: AC

## 2023-11-23 MED ORDER — ADULT MULTIVITAMIN W/MINERALS CH
1.0000 | ORAL_TABLET | Freq: Every day | ORAL | Status: DC
Start: 2023-11-24 — End: 2023-11-24
  Administered 2023-11-24: 1 via ORAL
  Filled 2023-11-23: qty 1

## 2023-11-23 NOTE — Progress Notes (Addendum)
 PROGRESS NOTE    Carolyn [name]  WUJ:[id number] DOB: 1981-08-22 DOA: 11/21/2023 PCP: Patient, No Pcp Per    Assessment & Plan:   Principal Problem:   Melena Active Problems:   Iron deficiency anemia due to chronic blood loss   History of gastric ulcer   Right-sided chest pain   Ulcer of esophagus with bleeding  Assessment and Plan: Melena: w/ hx of gastric ulcer. Was taking NSAIDs at home. S/p EGD which showed esophageal ulcer oozing blood & clip was placed as per GI. Continue on PPI. No NSAIDs.   IDA: w/ component of acute blood loss anemia. W/ hx of menorrhagia. Will start po iron supplement. Will need to f/u outpatient w/ gyn and pt verbalized her understanding. Will transfuse 1 unit of pRBCs secondary to Hb 6.8. Repeat H&H 4 hours post transfusion    Right chest pain: likely MSK in etiology. EKG unremarkable. Neg troponin. CXR is WNL. Resolved    Tobacco abuse: nicotine patch to prevent w/drawl. Smoking cessation counseling x 5 mins   Hypokalemia: WNL today     DVT prophylaxis: SCDs Code Status: full  Family Communication: Disposition Plan: d/c home  Level of care: Med-Surg  Status is: Inpatient Remains inpatient appropriate because: needs a 1 unit of pRBCs and H&H are trending down     Consultants:  GI   Procedures:   Antimicrobials:    Subjective: Pt c/o fatigue   Objective: Vitals:   11/22/23 1937 11/23/23 0347 11/23/23 1100 11/23/23 1144  BP: 118/76 126/82 115/68 118/72  Pulse: 76 76 82 83  Resp: 16 20 18 18   Temp: 98.4 F (36.9 C) 98.1 F (36.7 C) 98.3 F (36.8 C) 98.4 F (36.9 C)  TempSrc:  Oral Oral Oral  SpO2: 100% 100% 100% 100%  Weight:      Height:        Intake/Output Summary (Last 24 hours) at 11/23/2023 1325 Last data filed at 11/23/2023 0900 Gross per 24 hour  Intake 0 ml  Output --  Net 0 ml   Filed Weights   11/21/23 1142 11/22/23 1036  Weight: 80.1 kg 80.1 kg    Examination:  General exam: Appears comfortable   Respiratory system: clear breath sounds b/l  Cardiovascular system: S1/S2+. No rubs or clicks  Gastrointestinal system: abd is soft, NT, ND & normal bowel sounds  Central nervous system: alert & oriented. Moves all extremities  Psychiatry: Judgement and insight appears normal. Appropriate mood and affect    Data Reviewed: I have personally reviewed following labs and imaging studies  CBC: Recent Labs  Lab 11/21/23 1148 11/22/23 0428 11/23/23 0458  WBC 6.9 5.7 5.6  HGB 7.6* 7.2* 6.8*  HCT 25.8* 23.9* 22.6*  MCV 73.3* 71.6* 71.1*  PLT 232 230 226   Basic Metabolic Panel: Recent Labs  Lab 11/21/23 1148 11/22/23 0428 11/23/23 0458  NA 138 139 139  K 3.7 3.4* 3.8  CL 106 108 106  CO2 23 25 26   GLUCOSE 117* 95 94  BUN 13 8 14   CREATININE 0.68 0.65 0.66  CALCIUM 8.4* 8.4* 8.5*   GFR: Estimated Creatinine Clearance: 103.9 mL/min (by C-G formula based on SCr of 0.66 mg/dL). Liver Function Tests: Recent Labs  Lab 11/21/23 1148  AST 16  ALT 11  ALKPHOS 41  BILITOT 0.4  PROT 6.9  ALBUMIN 3.6   Recent Labs  Lab 11/21/23 1148  LIPASE 44   No results for input(s): "AMMONIA" in the last 168 hours. Coagulation Profile: Recent  Labs  Lab 11/22/23 0428  INR 1.1   Cardiac Enzymes: No results for input(s): "CKTOTAL", "CKMB", "CKMBINDEX", "TROPONINI" in the last 168 hours. BNP (last 3 results) No results for input(s): "PROBNP" in the last 8760 hours. HbA1C: No results for input(s): "HGBA1C" in the last 72 hours. CBG: No results for input(s): "GLUCAP" in the last 168 hours. Lipid Profile: No results for input(s): "CHOL", "HDL", "LDLCALC", "TRIG", "CHOLHDL", "LDLDIRECT" in the last 72 hours. Thyroid Function Tests: No results for input(s): "TSH", "T4TOTAL", "FREET4", "T3FREE", "THYROIDAB" in the last 72 hours. Anemia Panel: Recent Labs    11/23/23 1106  FOLATE 10.9  FERRITIN 3*  TIBC 444  IRON 19*   Sepsis Labs: No results for input(s): "PROCALCITON",  "LATICACIDVEN" in the last 168 hours.  No results found for this or any previous visit (from the past 240 hours).       Radiology Studies: DG Chest 2 View Result Date: 11/21/2023 CLINICAL DATA:  Chest pain. EXAM: CHEST - 2 VIEW COMPARISON:  August 21, 2022. FINDINGS: The heart size and mediastinal contours are within normal limits. Both lungs are clear. The visualized skeletal structures are unremarkable. IMPRESSION: No active cardiopulmonary disease. Electronically Signed   By: Lupita Raider M.D.   On: 11/21/2023 18:22   CT ABDOMEN PELVIS W CONTRAST Result Date: 11/21/2023 CLINICAL DATA:  Right lower quadrant abdominal pain EXAM: CT ABDOMEN AND PELVIS WITH CONTRAST TECHNIQUE: Multidetector CT imaging of the abdomen and pelvis was performed using the standard protocol following bolus administration of intravenous contrast. RADIATION DOSE REDUCTION: This exam was performed according to the departmental dose-optimization program which includes automated exposure control, adjustment of the mA and/or kV according to patient size and/or use of iterative reconstruction technique. CONTRAST:  OMNIPAQUE IOHEXOL 300 MG/ML  SOLN COMPARISON:  CT abdomen pelvis 07/19/2023 FINDINGS: Lower chest: Normal heart size. Lung bases are clear. No pleural effusion. Hepatobiliary: The liver is normal in size and contour. No focal lesion. Cholelithiasis. No gallbladder wall thickening or pericholecystic fluid. Pancreas: Unremarkable Spleen: Unremarkable Adrenals/Urinary Tract: Normal adrenal glands. Symmetric renal enhancement. No renal mass. No hydronephrosis. Urinary bladder is decompressed. Stomach/Bowel: Sigmoid colonic diverticulosis. No CT evidence for acute diverticulitis. Normal appendix. No evidence for bowel obstruction. No free fluid or free intraperitoneal air. Small hiatal hernia. Normal morphology of the stomach. Vascular/Lymphatic: Normal caliber abdominal aorta. No retroperitoneal lymphadenopathy.  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 Reproductive: Redemonstrated enlarged fibroid uterus. Other: None. Musculoskeletal: Osseous structures unremarkable. IMPRESSION: 1. No acute process within the abdomen or pelvis. 2. Cholelithiasis without CT evidence for acute cholecystitis. 3. Sigmoid colonic diverticulosis without CT evidence for acute diverticulitis. 4. Redemonstrated enlarged fibroid uterus. Electronically Signed   By: Annia Belt M.D.   On: 11/21/2023 16:48        Scheduled Meds:  ferrous gluconate  324 mg Oral BID WC   guaiFENesin  600 mg Oral BID   influenza vac split trivalent PF  0.5 mL Intramuscular Tomorrow-1000   [START ON 11/24/2023] multivitamin with minerals  1 tablet Oral Daily   nicotine  21 mg Transdermal Daily   pantoprazole (PROTONIX) IV  40 mg Intravenous Q12H   Ensure Max Protein  11 oz Oral BID   sodium chloride flush  3 mL Intravenous Q12H   Continuous Infusions:     LOS: 2 days      Charise Killian, MD [facility] Pager [phone]  If 7PM-7AM, please contact night-coverage [email or web address] 11/23/2023, 1:25 PM

## 2023-11-23 NOTE — Progress Notes (Signed)
 Carolyn Minium, MD [facility]   [address], [address] [place], [place] [address] Phone: [phone] Fax : [phone]   Subjective: The patient has no complaint today.  She denies any black stools or bloody stools.  The patient has dropped her hemoglobin but her BUN is remained stable indicating this is unlikely a GI bleed.  The patient does report that she is having her period at this time and has heavy periods.   Objective: Vital signs in last 24 hours: Vitals:   11/22/23 1241 11/22/23 1613 11/22/23 1937 11/23/23 0347  BP: (!) 131/93 116/74 118/76 126/82  Pulse: 81 79 76 76  Resp: 16 15 16 20   Temp: 98 F (36.7 C) 98.4 F (36.9 C) 98.4 F (36.9 C) 98.1 F (36.7 C)  TempSrc: Oral Oral  Oral  SpO2: 100% 100% 100% 100%  Weight:      Height:       Weight change: 0 kg No intake or output data in the 24 hours ending 11/23/23 0902   Exam: General: Sitting up in bed in no apparent distress.   Lab Results: @LABTEST2 @ Micro Results: No results found for this or any previous visit (from the past 240 hours). Studies/Results: DG Chest 2 View Result Date: 11/21/2023 CLINICAL DATA:  Chest pain. EXAM: CHEST - 2 VIEW COMPARISON:  August 21, 2022. FINDINGS: The heart size and mediastinal contours are within normal limits. Both lungs are clear. The visualized skeletal structures are unremarkable. IMPRESSION: No active cardiopulmonary disease. Electronically Signed   By: Lupita Raider M.D.   On: 11/21/2023 18:22   CT ABDOMEN PELVIS W CONTRAST Result Date: 11/21/2023 CLINICAL DATA:  Right lower quadrant abdominal pain EXAM: CT ABDOMEN AND PELVIS WITH CONTRAST TECHNIQUE: Multidetector CT imaging of the abdomen and pelvis was performed using the standard protocol following bolus administration of intravenous contrast. RADIATION DOSE REDUCTION: This exam was performed according to the departmental dose-optimization program which includes automated exposure control, adjustment of the mA  and/or kV according to patient size and/or use of iterative reconstruction technique. CONTRAST:  OMNIPAQUE IOHEXOL 300 MG/ML  SOLN COMPARISON:  CT abdomen pelvis 07/19/2023 FINDINGS: Lower chest: Normal heart size. Lung bases are clear. No pleural effusion. Hepatobiliary: The liver is normal in size and contour. No focal lesion. Cholelithiasis. No gallbladder wall thickening or pericholecystic fluid. Pancreas: Unremarkable Spleen: Unremarkable Adrenals/Urinary Tract: Normal adrenal glands. Symmetric renal enhancement. No renal mass. No hydronephrosis. Urinary bladder is decompressed. Stomach/Bowel: Sigmoid colonic diverticulosis. No CT evidence for acute diverticulitis. Normal appendix. No evidence for bowel obstruction. No free fluid or free intraperitoneal air. Small hiatal hernia. Normal morphology of the stomach. Vascular/Lymphatic: Normal caliber abdominal aorta. No retroperitoneal lymphadenopathy. 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 Reproductive: Redemonstrated enlarged fibroid uterus. Other: None. Musculoskeletal: Osseous structures unremarkable. IMPRESSION: 1. No acute process within the abdomen or pelvis. 2. Cholelithiasis without CT evidence for acute cholecystitis. 3. Sigmoid colonic diverticulosis without CT evidence for acute diverticulitis. 4. Redemonstrated enlarged fibroid uterus. Electronically Signed   By: Annia Belt M.D.   On: 11/21/2023 16:48   Medications: I have reviewed the patient's current medications. Scheduled Meds:  sodium chloride   Intravenous Once   feeding supplement  1 Container Oral TID BM   influenza vac split trivalent PF  0.5 mL Intramuscular Tomorrow-1000   nicotine  21 mg Transdermal Daily   pantoprazole (PROTONIX) IV  40 mg Intravenous Q12H   sodium chloride flush  3 mL Intravenous Q12H   Continuous Infusions: PRN Meds:.morphine injection,  oxyCODONE-acetaminophen, sodium chloride flush   Assessment: Principal Problem:   Melena Active Problems:   Iron  deficiency anemia due to chronic blood loss   History of gastric ulcer   Right-sided chest pain   Ulcer of esophagus with bleeding    Plan: The patient has no sign of any GI bleeding although she did drop her hemoglobin somewhat.  The patient's EGD showed a very small oozing lesion in the GE junction and the biopsies of the thickened folds in the stomach were benign.  The patient will need a colonoscopy in the future and this can be done as an outpatient.   LOS: 2 days   Carolyn Minium, MD.FACG 11/23/2023, 9:02 AM Pager [phone] 7am-5pm  Check AMION for 5pm -7am coverage and on weekends

## 2023-11-23 NOTE — Progress Notes (Signed)
 Initial Nutrition Assessment  DOCUMENTATION CODES:   Not applicable  INTERVENTION:   Ensure Max protein supplement po BID, each supplement provides 150kcal and 30g of protein.   MVI po daily   Check iron/anemia labs  NUTRITION DIAGNOSIS:   Increased nutrient needs related to acute illness as evidenced by estimated needs.  GOAL:   Patient will meet greater than or equal to 90% of their needs  MONITOR:   PO intake, Supplement acceptance, Labs, Weight trends, Skin, I & O's  REASON FOR ASSESSMENT:   Malnutrition Screening Tool    ASSESSMENT:   43 y/o female with h/o IDA, PUD, HTN, GERD, fibroids, reported marijuna and etoh use who is admitted with GIB.  RD working remotely.  -Pt s/p EGD 2/19; pt noted to have esophageal ulcer noted at the GE junction  Pt initiated on a soft diet. RD will change Boost Breeze over to Ensure Max. RD will add daily MVI. Pt with h/o iron deficiency anemia; last infusion appears to have been in November. Will check iron/anemia labs. Pt reports weight loss pta that she relates to her walking everywhere because of no vehicle access. Per chart, pt appears weight stable at baseline if her admission weights are correct. RD will obtain history and exam at follow up.   Medications reviewed and include: nicotine, protonix   Labs reviewed: K 3.8 wnl Hgb 6.8(L), Hct 22.6(L), MCV 71.1(L), MCH 21.4(L)  NUTRITION - FOCUSED PHYSICAL EXAM: Unable to perform at this time   Diet Order:   Diet Order             DIET SOFT Fluid consistency: Thin  Diet effective now                  EDUCATION NEEDS:   No education needs have been identified at this time  Skin:  Skin Assessment: Reviewed RN Assessment  Last BM:  2/19  Height:   Ht Readings from Last 1 Encounters:  11/22/23 5\' 8"  (1.727 m)    Weight:   Wt Readings from Last 1 Encounters:  11/22/23 80.1 kg    Ideal Body Weight:  63.6 kg  BMI:  Body mass index is 26.85  kg/m.  Estimated Nutritional Needs:   Kcal:  1900-2200kcal/day  Protein:  95-110g/day  Fluid:  1.9-2.2L/day  Betsey Holiday MS, RD, LDN If unable to be reached, please send secure chat to "RD inpatient" available from 8:00a-4:00p daily

## 2023-11-23 NOTE — Plan of Care (Signed)

## 2023-11-23 NOTE — Plan of Care (Signed)

## 2023-11-24 DIAGNOSIS — D500 Iron deficiency anemia secondary to blood loss (chronic): Secondary | ICD-10-CM | POA: Diagnosis not present

## 2023-11-24 DIAGNOSIS — K921 Melena: Secondary | ICD-10-CM | POA: Diagnosis not present

## 2023-11-24 LAB — BASIC METABOLIC PANEL
Anion gap: 6 (ref 5–15)
BUN: 14 mg/dL (ref 6–20)
CO2: 26 mmol/L (ref 22–32)
Calcium: 8.4 mg/dL — ABNORMAL LOW (ref 8.9–10.3)
Chloride: 106 mmol/L (ref 98–111)
Creatinine, Ser: 0.68 mg/dL (ref 0.44–1.00)
GFR, Estimated: >60 mL/min (ref >60)
Glucose, Bld: 103 mg/dL — ABNORMAL HIGH (ref 70–99)
Potassium: 3.6 mmol/L (ref 3.5–5.1)
Sodium: 138 mmol/L (ref 135–145)

## 2023-11-24 LAB — CBC
HCT: 25.4 % — ABNORMAL LOW (ref 36.0–46.0)
Hemoglobin: 7.7 g/dL — ABNORMAL LOW (ref 12.0–15.0)
MCH: 22 pg — ABNORMAL LOW (ref 26.0–34.0)
MCHC: 30.3 g/dL (ref 30.0–36.0)
MCV: 72.6 fL — ABNORMAL LOW (ref 80.0–100.0)
Platelets: 228 10*3/uL (ref 150–400)
RBC: 3.5 MIL/uL — ABNORMAL LOW (ref 3.87–5.11)
RDW: 21.6 % — ABNORMAL HIGH (ref 11.5–15.5)
WBC: 5.4 10*3/uL (ref 4.0–10.5)
nRBC: 0 % (ref 0.0–0.2)

## 2023-11-24 LAB — BPAM RBC
Blood Product Expiration Date: 202503242359
ISSUE DATE / TIME: 202502201130
Unit Type and Rh: 6200

## 2023-11-24 LAB — TYPE AND SCREEN
ABO/RH(D): A POS
Antibody Screen: NEGATIVE
Unit division: 0

## 2023-11-24 MED ORDER — FERROUS SULFATE 325 (65 FE) MG PO TBEC: 325.0000 mg | DELAYED_RELEASE_TABLET | Freq: Two times a day (BID) | ORAL | 0 refills | Status: DC

## 2023-11-24 MED ORDER — PANTOPRAZOLE SODIUM 40 MG PO TBEC: 40.0000 mg | DELAYED_RELEASE_TABLET | Freq: Two times a day (BID) | ORAL | 0 refills | Status: AC

## 2023-11-24 NOTE — Plan of Care (Signed)

## 2023-11-24 NOTE — Discharge Summary (Signed)
 Physician Discharge Summary  Carolyn [name] ZOX:[id number] DOB: June 30, 1981 DOA: 11/21/2023  PCP: Patient, No Pcp Per  Admit date: 11/21/2023 Discharge date: 11/24/2023  Admitted From: home  Disposition:  home   Recommendations for Outpatient Follow-up:  Follow up with PCP in 1-2 weeks F/u w/ gyn in 1 week  Get CBC to check H&H   Home Health: no  Equipment/Devices:  Discharge Condition: stable  CODE STATUS: full  Diet recommendation: Regular   Brief/Interim Summary: HPI was taken from Dr. Ashok Pall: Carolyn [name] is a 43 y.o. female with medical history significant for history gastric ulcer, menorrhagia, iron deficiency anemia, presenting with the above complaint.   Has been bothered by right shoulder and right sided chest pain for several weeks. Began taking both ibuprofen and naproxen together several times a day for the past several weeks. Previously prescribed a ppi but hasn't been taking it. Didn't go for outpt colonoscopy advised after November 2023 admission for GI bleed. Has heavy periods, last period was last month, no current vaginal bleeding. Began noticing black stool about a week ago, every time she stools. Intermittent epigastric abd pain, none current. No emesis. No lightheadedness.  Discharge Diagnoses:  Principal Problem:   Melena Active Problems:   Iron deficiency anemia due to chronic blood loss   History of gastric ulcer   Right-sided chest pain   Ulcer of esophagus with bleeding  Melena: w/ hx of gastric ulcer. Was taking NSAIDs at home. S/p EGD which showed esophageal ulcer oozing blood & clip was placed as per GI. Continue on PPI. No NSAIDs.   IDA: w/ component of acute blood loss anemia. W/ hx of menorrhagia. Will start po iron supplement. Will need to f/u outpatient w/ gyn and pt verbalized her understanding. H&H are labile.    Right chest pain: likely MSK in etiology. EKG unremarkable. Neg troponin. CXR is WNL. Resolved    Tobacco abuse: nicotine patch  to prevent w/drawl. Smoking cessation counseling x 5 mins    Hypokalemia: WNL today   Discharge Instructions  Discharge Instructions     Diet general   Complete by: As directed    Discharge instructions   Complete by: As directed    F/u w/ PCP in 1 week. Get CBC to check Hb & Hct. F/u w/ gyn w/in 1 week.   Increase activity slowly   Complete by: As directed       Allergies as of 11/24/2023   No Known Allergies      Medication List     STOP taking these medications    dicyclomine 20 MG tablet Commonly known as: BENTYL   sucralfate 1 g tablet Commonly known as: Carafate       TAKE these medications    ferrous sulfate 325 (65 FE) MG EC tablet Take 1 tablet (325 mg total) by mouth 2 (two) times daily.   nicotine 21 mg/24hr patch Commonly known as: NICODERM CQ - dosed in mg/24 hours One patch chest wall daily (okay to substitute generic)   pantoprazole 40 MG tablet Commonly known as: PROTONIX Take 1 tablet (40 mg total) by mouth 2 (two) times daily. What changed: when to take this        No Known Allergies  Consultations: GI    Procedures/Studies: DG Chest 2 View Result Date: 11/21/2023 CLINICAL DATA:  Chest pain. EXAM: CHEST - 2 VIEW COMPARISON:  August 21, 2022. FINDINGS: The heart size and mediastinal contours are within normal limits. Both lungs are clear.  The visualized skeletal structures are unremarkable. IMPRESSION: No active cardiopulmonary disease. Electronically Signed   By: Lupita Raider M.D.   On: 11/21/2023 18:22   CT ABDOMEN PELVIS W CONTRAST Result Date: 11/21/2023 CLINICAL DATA:  Right lower quadrant abdominal pain EXAM: CT ABDOMEN AND PELVIS WITH CONTRAST TECHNIQUE: Multidetector CT imaging of the abdomen and pelvis was performed using the standard protocol following bolus administration of intravenous contrast. RADIATION DOSE REDUCTION: This exam was performed according to the departmental dose-optimization program which includes  automated exposure control, adjustment of the mA and/or kV according to patient size and/or use of iterative reconstruction technique. CONTRAST:  OMNIPAQUE IOHEXOL 300 MG/ML  SOLN COMPARISON:  CT abdomen pelvis 07/19/2023 FINDINGS: Lower chest: Normal heart size. Lung bases are clear. No pleural effusion. Hepatobiliary: The liver is normal in size and contour. No focal lesion. Cholelithiasis. No gallbladder wall thickening or pericholecystic fluid. Pancreas: Unremarkable Spleen: Unremarkable Adrenals/Urinary Tract: Normal adrenal glands. Symmetric renal enhancement. No renal mass. No hydronephrosis. Urinary bladder is decompressed. Stomach/Bowel: Sigmoid colonic diverticulosis. No CT evidence for acute diverticulitis. Normal appendix. No evidence for bowel obstruction. No free fluid or free intraperitoneal air. Small hiatal hernia. Normal morphology of the stomach. Vascular/Lymphatic: Normal caliber abdominal aorta. No retroperitoneal lymphadenopathy. 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 Reproductive: Redemonstrated enlarged fibroid uterus. Other: None. Musculoskeletal: Osseous structures unremarkable. IMPRESSION: 1. No acute process within the abdomen or pelvis. 2. Cholelithiasis without CT evidence for acute cholecystitis. 3. Sigmoid colonic diverticulosis without CT evidence for acute diverticulitis. 4. Redemonstrated enlarged fibroid uterus. Electronically Signed   By: Annia Belt M.D.   On: 11/21/2023 16:48   (Echo, Carotid, EGD, Colonoscopy, ERCP)    Subjective: Pt c/o fatigue    Discharge Exam: Vitals:   11/24/23 0419 11/24/23 0838  BP: 116/76 102/64  Pulse: 72 82  Resp: 16 18  Temp: 98.1 F (36.7 C) 97.9 F (36.6 C)  SpO2: 100% 100%   Vitals:   11/23/23 2004 11/24/23 0419 11/24/23 0500 11/24/23 0838  BP: 119/89 116/76  102/64  Pulse: 73 72  82  Resp: 16 16  18   Temp: 98 F (36.7 C) 98.1 F (36.7 C)  97.9 F (36.6 C)  TempSrc: Oral Oral  Oral  SpO2: 100% 100%  100%  Weight:   83.4  kg   Height:        General: Pt is alert, awake, not in acute distress Cardiovascular:  S1/S2 +, no rubs, no gallops Respiratory: CTA bilaterally, no wheezing, no rhonchi Abdominal: Soft, NT, ND, bowel sounds + Extremities: no edema, no cyanosis    The results of significant diagnostics from this hospitalization (including imaging, microbiology, ancillary and laboratory) are listed below for reference.     Microbiology: No results found for this or any previous visit (from the past 240 hours).   Labs: BNP (last 3 results) No results for input(s): "BNP" in the last 8760 hours. Basic Metabolic Panel: Recent Labs  Lab 11/21/23 1148 11/22/23 0428 11/23/23 0458 11/24/23 0504  NA 138 139 139 138  K 3.7 3.4* 3.8 3.6  CL 106 108 106 106  CO2 23 25 26 26   GLUCOSE 117* 95 94 103*  BUN 13 8 14 14   CREATININE 0.68 0.65 0.66 0.68  CALCIUM 8.4* 8.4* 8.5* 8.4*   Liver Function Tests: Recent Labs  Lab 11/21/23 1148  AST 16  ALT 11  ALKPHOS 41  BILITOT 0.4  PROT 6.9  ALBUMIN 3.6   Recent Labs  Lab 11/21/23 1148  LIPASE  44   No results for input(s): "AMMONIA" in the last 168 hours. CBC: Recent Labs  Lab 11/21/23 1148 11/22/23 0428 11/23/23 0458 11/23/23 1609 11/24/23 0504  WBC 6.9 5.7 5.6 6.1 5.4  HGB 7.6* 7.2* 6.8* 7.9* 7.7*  HCT 25.8* 23.9* 22.6* 26.4* 25.4*  MCV 73.3* 71.6* 71.1* 74.6* 72.6*  PLT 232 230 226 217 228   Cardiac Enzymes: No results for input(s): "CKTOTAL", "CKMB", "CKMBINDEX", "TROPONINI" in the last 168 hours. BNP: Invalid input(s): "POCBNP" CBG: No results for input(s): "GLUCAP" in the last 168 hours. D-Dimer No results for input(s): "DDIMER" in the last 72 hours. Hgb A1c No results for input(s): "HGBA1C" in the last 72 hours. Lipid Profile No results for input(s): "CHOL", "HDL", "LDLCALC", "TRIG", "CHOLHDL", "LDLDIRECT" in the last 72 hours. Thyroid function studies No results for input(s): "TSH", "T4TOTAL", "T3FREE", "THYROIDAB" in  the last 72 hours.  Invalid input(s): "FREET3" Anemia work up Recent Labs    11/23/23 1106  VITAMINB12 271  FOLATE 10.9  FERRITIN 3*  TIBC 444  IRON 19*   Urinalysis    Component Value Date/Time   COLORURINE YELLOW (A) 11/21/2023 1148   APPEARANCEUR HAZY (A) 11/21/2023 1148   LABSPEC 1.023 11/21/2023 1148   PHURINE 5.0 11/21/2023 1148   GLUCOSEU NEGATIVE 11/21/2023 1148   HGBUR SMALL (A) 11/21/2023 1148   BILIRUBINUR NEGATIVE 11/21/2023 1148   KETONESUR NEGATIVE 11/21/2023 1148   PROTEINUR NEGATIVE 11/21/2023 1148   NITRITE NEGATIVE 11/21/2023 1148   LEUKOCYTESUR NEGATIVE 11/21/2023 1148   Sepsis Labs Recent Labs  Lab 11/22/23 0428 11/23/23 0458 11/23/23 1609 11/24/23 0504  WBC 5.7 5.6 6.1 5.4   Microbiology No results found for this or any previous visit (from the past 240 hours).   Time coordinating discharge: Over 30 minutes  SIGNED:   Charise Killian, MD  [facility] 11/24/2023, 9:38 AM Pager   If 7PM-7AM, please contact night-coverage [email or web address]

## 2023-11-24 NOTE — Progress Notes (Signed)
    Carolyn Minium, MD [facility]   [address], [address] [place], [place] [address] Phone: [phone] Fax : [phone]   Subjective: The patient reports doing well this morning.  She continues to have heavy periods and is on her period right now.  The patient's hemoglobin yesterday was 7.9 and today at 7.7.  She denies any black stools or bloody stools.  She also denies any abdominal pain.   Objective: Vital signs in last 24 hours: Vitals:   11/23/23 2004 11/24/23 0419 11/24/23 0500 11/24/23 0838  BP: 119/89 116/76  102/64  Pulse: 73 72  82  Resp: 16 16  18   Temp: 98 F (36.7 C) 98.1 F (36.7 C)  97.9 F (36.6 C)  TempSrc: Oral Oral  Oral  SpO2: 100% 100%  100%  Weight:   83.4 kg   Height:       Weight change: 3.3 kg  Intake/Output Summary (Last 24 hours) at 11/24/2023 4259 Last data filed at 11/23/2023 1700 Gross per 24 hour  Intake 445.33 ml  Output --  Net 445.33 ml     Exam: General: The patient is in the bathroom washing up in no apparent distress speaking in full sentences   Lab Results: @LABTEST2 @ Micro Results: No results found for this or any previous visit (from the past 240 hours). Studies/Results: No results found. Medications: I have reviewed the patient's current medications. Scheduled Meds:  ferrous gluconate  324 mg Oral BID WC   guaiFENesin  600 mg Oral BID   influenza vac split trivalent PF  0.5 mL Intramuscular Tomorrow-1000   multivitamin with minerals  1 tablet Oral Daily   nicotine  21 mg Transdermal Daily   pantoprazole (PROTONIX) IV  40 mg Intravenous Q12H   Ensure Max Protein  11 oz Oral BID   sodium chloride flush  3 mL Intravenous Q12H   Continuous Infusions: PRN Meds:.morphine injection, oxyCODONE-acetaminophen, sodium chloride flush   Assessment: Principal Problem:   Melena Active Problems:   Iron deficiency anemia due to chronic blood loss   History of gastric ulcer   Right-sided chest pain   Ulcer of esophagus with  bleeding    Plan: This patient has had no sign of any GI bleeding and has a heavy menstrual cycle which is going on at the present time.  The patient has been told that she should be evaluated by GYN due to her heavy periods resulting in anemia.  The patient has also been told that at some time in the future she should have an outpatient colonoscopy.  The patient has been explained the plan and agrees with it.  I will sign off.  Please call if any further GI concerns or questions.  We would like to thank you for the opportunity to participate in the care of Carolyn [name].    LOS: 3 days   Carolyn Minium, MD.FACG 11/24/2023, 9:09 AM Pager [phone] 7am-5pm  Check AMION for 5pm -7am coverage and on weekends

## 2023-12-07 ENCOUNTER — Encounter: Payer: 59 | Admitting: Obstetrics and Gynecology

## 2023-12-07 DIAGNOSIS — D219 Benign neoplasm of connective and other soft tissue, unspecified: Secondary | ICD-10-CM

## 2023-12-07 DIAGNOSIS — Z7689 Persons encountering health services in other specified circumstances: Secondary | ICD-10-CM

## 2023-12-13 ENCOUNTER — Encounter: Payer: Self-pay | Admitting: Obstetrics and Gynecology

## 2023-12-20 ENCOUNTER — Ambulatory Visit

## 2024-01-12 ENCOUNTER — Encounter: Payer: Self-pay | Admitting: Oncology

## 2024-01-15 ENCOUNTER — Encounter: Payer: Self-pay | Admitting: Oncology

## 2024-02-01 ENCOUNTER — Ambulatory Visit

## 2024-02-13 ENCOUNTER — Ambulatory Visit (LOCAL_COMMUNITY_HEALTH_CENTER): Admitting: Family Medicine

## 2024-02-13 ENCOUNTER — Ambulatory Visit

## 2024-02-13 ENCOUNTER — Encounter: Payer: Self-pay | Admitting: Family Medicine

## 2024-02-13 VITALS — BP 113/74 | HR 88 | Ht 68.0 in | Wt 163.6 lb

## 2024-02-13 DIAGNOSIS — F17200 Nicotine dependence, unspecified, uncomplicated: Secondary | ICD-10-CM

## 2024-02-13 DIAGNOSIS — Z01419 Encounter for gynecological examination (general) (routine) without abnormal findings: Secondary | ICD-10-CM | POA: Diagnosis not present

## 2024-02-13 DIAGNOSIS — Z309 Encounter for contraceptive management, unspecified: Secondary | ICD-10-CM | POA: Diagnosis not present

## 2024-02-13 DIAGNOSIS — F32A Depression, unspecified: Secondary | ICD-10-CM

## 2024-02-13 DIAGNOSIS — N760 Acute vaginitis: Secondary | ICD-10-CM

## 2024-02-13 DIAGNOSIS — Z30013 Encounter for initial prescription of injectable contraceptive: Secondary | ICD-10-CM | POA: Diagnosis not present

## 2024-02-13 DIAGNOSIS — B9689 Other specified bacterial agents as the cause of diseases classified elsewhere: Secondary | ICD-10-CM

## 2024-02-13 DIAGNOSIS — R1310 Dysphagia, unspecified: Secondary | ICD-10-CM

## 2024-02-13 DIAGNOSIS — Z113 Encounter for screening for infections with a predominantly sexual mode of transmission: Secondary | ICD-10-CM

## 2024-02-13 DIAGNOSIS — Z3009 Encounter for other general counseling and advice on contraception: Secondary | ICD-10-CM

## 2024-02-13 LAB — HM HIV SCREENING LAB: HM HIV Screening: NEGATIVE

## 2024-02-13 MED ORDER — MEDROXYPROGESTERONE ACETATE 150 MG/ML IM SUSP
150.0000 mg | INTRAMUSCULAR | Status: DC
  Administered 2024-02-13: 150 mg via INTRAMUSCULAR

## 2024-02-13 MED ORDER — METRONIDAZOLE 500 MG PO TABS: 500.0000 mg | ORAL_TABLET | Freq: Two times a day (BID) | ORAL | Status: AC

## 2024-02-13 NOTE — Progress Notes (Unsigned)
 Pt here for annual physical and to start Depo.  Wet mount results reviewed with patient.  Results positive for BV (clue cells, discharge, elevated Ph).  Per verbal order of Fain Home, FNP,  Metronidazole  500mg  #14 dispensed to patient.  Counseled on medication, side effects, plan of care and when to contact clinic with questions or concerns.  Verbalizes understanding.  Depo Provera  150mg  IM given in L deltoid without complications.  Condoms provided.  Family planning packet given.  Reminder card for next injection given.-Joselinne Lawal, RN

## 2024-02-13 NOTE — Progress Notes (Unsigned)
 [employer] [facility] [address] [place] [address] Main phone: [phone]  Family Planning Visit - Repeat Yearly Visit  Subjective:  Carolyn [name] is a 43 y.o. G1P1001  being seen today for an annual wellness visit and to discuss contraception options. The patient is currently using no method - no contraceptive precautions for pregnancy prevention. Patient does not want a pregnancy in the next year.   Patient reports they are looking for a method with the following characteristics:  High efficacy at preventing pregnancy  Patient has the following medical problems:  Patient Active Problem List   Diagnosis Date Noted   Ulcer of esophagus with bleeding 11/22/2023   Melena 11/21/2023   Right-sided chest pain 11/21/2023   Hypokalemia 08/21/2022   Fibroid 02/18/2021   History of gastric ulcer 02/12/2021   History of GI bleed 02/12/2021   Endometrial stripe increased 02/12/2021   Intramural leiomyoma of uterus    Menorrhagia with regular cycle 03/20/2018   Iron  deficiency anemia due to chronic blood loss    Duodenal ulceration    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 Reports LMP 01/21/24. Last sex on 5/6- unprotected. Too early for a PT. However counseled that if she had gotten pregnant- depo would not harm an established pregnancy.   Review of Systems  Constitutional:  Positive for weight loss.  Eyes:  Positive for blurred vision.  Respiratory:  Negative for cough and shortness of breath.   Cardiovascular:  Negative for claudication.  Gastrointestinal:  Negative for nausea.  Genitourinary:  Negative for dysuria and frequency.  Skin:  Negative for rash.  Neurological:  Positive for headaches.  Endo/Heme/Allergies:  Does not bruise/bleed easily.   Psychiatric/Behavioral:  Positive for depression.     See flowsheet for further details and programmatic requirements Hyperlink available at the top of the signed note in blue.  Flow sheet content below:  Pregnancy Intention Screening Does the patient want to become pregnant in the next year?: No Does the patient's partner want to become pregnant in the next year?: No Would the patient like to discuss contraceptive options today?: Yes Results Follow up Password: [phone] Is it okay to contact you by mail?: Yes Contraception History Past methods of contraception used by patient:: Contraceptive Pill, Hormonal Implant, Hormonal Injection Adverse effects associated with Contraceptive Pill: moodiness Adverse effects associated with Hormonal Injection: none Adverse effects associated with Hormonal Implant: moodiness Sexual History What age did you start your period?: 14 How often do you have your period?: monthly Date of last sex?: 02/06/24 Has the patient had unprotected sex within the last 5 days?: No Do you have sex with men, women, both men and women?: Men only In the past 2 months how many partners have you had sex with?: 1 In the past 12 months, how many partners have you had sex with?: 1 Is it possible that any of your sex partners in the past 12 months had sex with someone else whild they were still in a sexual relationship with you?: Yes What ways do you have sex?: Vaginal, Oral, Anal Do you or your partner use condoms and/or dental dams every time you have vaginal, oral or anal sex?: No Do you douche?: Yes How often?: every now and then Last time?: last month Date  of last HIV test?: 11/22/23 Have you ever had an STD?: Yes Have any of your partners had an STD?: No Have you or your partner ever shot up drugs?: No Have any of your partners used drugs in the past?: No Have you or your partners exchanged money or drugs for sex?: No Risk Factors for Hep B Household, sexual, or  needle sharing contact of a person infected with Hep B: No Sexual contact with a person who uses drugs not as prescribed?: No Currently or Ever used drugs not as prescribed: No HIV Positive: No PRep Patient: No Men who have sex with men: N/A Have Hepatitis C: No History of Incarceration: No History of Homeslessness?: No Anal sex following anal drug use?: No Risk Factors for Hep C Currently using drugs not as prescribed: No Sexual partner(s) currently using drugs as not prescribed: No History of drug use: No HIV Positive: No People with a history of incarceration: No People born between the years of 17 and 71: No Counseling All Patients: Use specific methods of contraceoptive and identify adverse effects (R), Stop tobacco use, implementing the 5A counseling approach (R), Encourage mammagram for women 74 or older and younger than 50 if conditions support (R), Emergency Contraception Offered (R) if unprotected sex in past 5 days and/or propyhlactically as indicated., Provide emergency contraception counseling (R), Typical use rates for method effectiveness (R), Delay future pregnancy from 18 months to 5 years (R) at [facility] visit, Appropriate referral for additional services as needed (R) Education: Make informed decision about family planning Contraception Wrap Up Current Method: No Contraceptive Precautions End Method: Hormonal Injection Contraception Counseling Provided: Yes How was the end contraceptive method provided?: Provided on site  Diabetes screening This patient is 43 y.o. with a BMI of Body mass index is 24.88 kg/m.Aaron Aas  Is patient eligible for diabetes screening (age >35 and BMI >25)?  no  Was Hgb A1c ordered? not applicable  STI screening Patient reports 1 of partners in last year.  Does this patient desire STI screening?  Yes  Hepatitis C screening Has patient been screened once for HCV in the past?  No  No results found for: "HCVAB"  Does the patient meet criteria  for HCV testing? No  (If yes-- Screen for HCV through [facility]) Criteria:  Since the last HCV result, does the patient have any of the following? - Current drug use - Have a partner with drug use - Has been incarcerated  Hepatitis B screening Does the patient meet criteria for HBV testing? No Criteria:  -Household, sexual or needle sharing contact with HBV -History of drug use -HIV positive -Those with known Hep C  Cervical Cancer Screening  Result Date Procedure Results Follow-ups  02/18/2021 Cytology - PAP High risk HPV: Negative Adequacy: Satisfactory for evaluation; transformation zone component PRESENT. Diagnosis: - Negative for intraepithelial lesion or malignancy (NILM) Comment: Normal Reference Range HPV - Negative   02/18/2021 Surgical pathology SURGICAL PATHOLOGY: SURGICAL PATHOLOGY CASE: [id number] PATIENT: Alexx Weisse Surgical Pathology Report     Clinical History: Fibroid, menorrhagia with regular cycle (cm)     FINAL MICROSCOPIC DIAGNOSIS:  A. ENDOMETRIUM, BIOPSY: - Secretory endometrium...   03/20/2018 HM PAP SMEAR HM Pap smear: ASCUS, HPV negative     Health Maintenance Due  Topic Date Due   Pneumococcal Vaccine 27-3 Years old (1 of 2 - PCV) Never done   COVID-19 Vaccine (3 - 2024-25 season) 06/04/2023    The following portions of the patient's history were  reviewed and updated as appropriate: allergies, current medications, past family history, past medical history, past social history, past surgical history and problem list. Problem list updated.  Objective:   Vitals:   02/13/24 1309  BP: 113/74  Pulse: 88  Weight: 163 lb 9.6 oz (74.2 kg)  Height: 5\' 8"  (1.727 m)    Physical Exam Vitals and nursing note reviewed. Exam conducted with a chaperone present Susanna Epley CNA).  Constitutional:      Appearance: Normal appearance.  HENT:     Head: Normocephalic and atraumatic.     Mouth/Throat:     Mouth: Mucous membranes are moist.      Pharynx: Oropharynx is clear. No oropharyngeal exudate or posterior oropharyngeal erythema.  Pulmonary:     Effort: Pulmonary effort is normal.  Chest:  Breasts:    Tanner Score is 5.     Right: Normal. No inverted nipple, mass, nipple discharge or skin change.     Left: Normal. No inverted nipple, mass, nipple discharge or skin change.  Abdominal:     General: Abdomen is flat.     Palpations: There is no mass.     Tenderness: There is no abdominal tenderness. There is no rebound.  Genitourinary:    General: Normal vulva.     Exam position: Lithotomy position.     Pubic Area: No rash or pubic lice.      Labia:        Right: No rash or lesion.        Left: No rash or lesion.      Vagina: Vaginal discharge present. No erythema, bleeding or lesions.     Cervix: No cervical motion tenderness, discharge, friability, lesion or erythema.     Uterus: Normal.      Adnexa: Right adnexa normal and left adnexa normal.     Rectum: Normal.     Comments: pH = 4-5 Lymphadenopathy:     Head:     Right side of head: No preauricular or posterior auricular adenopathy.     Left side of head: No preauricular or posterior auricular adenopathy.     Cervical: No cervical adenopathy.     Upper Body:     Right upper body: No supraclavicular, axillary or epitrochlear adenopathy.     Left upper body: No supraclavicular, axillary or epitrochlear adenopathy.     Lower Body: No right inguinal adenopathy. No left inguinal adenopathy.  Skin:    General: Skin is warm and dry.     Findings: No rash.  Neurological:     Mental Status: She is alert and oriented to person, place, and time.     Assessment and Plan:  Rosetta Dechelle Ciano is a 43 y.o. female G1P1001 presenting to the [facility] Department for an yearly wellness and contraception visit  1. Family planning (Primary) Contraception counseling:  Reviewed options based on patient desire and reproductive life plan. Patient is  interested in Hormonal Injection. This was provided to the patient today.   Risks, benefits, and typical effectiveness rates were reviewed.  Questions were answered.  Written information was also given to the patient to review.    The patient will follow up in  3 months for surveillance.  The patient was told to call with any further questions, or with any concerns about this method of contraception.  Emphasized use of condoms 100% of the time for STI prevention.  Emergency Contraception Precautions (ECP): Patient assessed for need of ECP. She is not a  candidate based on report of unprotected sex more than 120 hours ago (5 days).  Educated on ECP and reviewed options.  Patient desires no method - patient politely declines any emergency contraception.   -counseled to take PT at home in 2 weeks  - medroxyPROGESTERone  (DEPO-PROVERA ) injection 150 mg  2. Depression, unspecified depression type -PHQ-9 score of 18 -denies SI -desires referral to counseling  - Ambulatory referral to Behavioral Health  3. Screening for venereal disease -vaginal discharge x 2 weeks  - Chlamydia/Gonorrhea [facility] - HIV [facility] - Syphilis Serology, [place] - WET PREP FOR TRICH, YEAST, CLUE  4. Dysphagia, unspecified type -has known hx of GI issues -made appt w GI doc for evaluation -was told she has "swollen glands" in her neck, and reports she feels this there -states her throat is sore, and it "feels like glass" when she swallows so she has decreased how much she eats   5. Tobacco use disorder -smoking 3-4 cig/day  -encouraged to cut back and quit -reviewed the 1-800-quit line  6. Well woman exam with routine gynecological exam -CBE today (normal) -has never had a mammogram, has insurance -counseled that she needs to ask PCP for referral to get a mammogram     Return in about 3 months (around 05/15/2024) for depo injection.  No future appointments.  Earleen Glazier, [place]

## 2024-02-14 LAB — WET PREP FOR TRICH, YEAST, CLUE
Clue Cell Exam: POSITIVE — AB
Trichomonas Exam: NEGATIVE
Yeast Exam: NEGATIVE

## 2024-02-15 ENCOUNTER — Encounter: Payer: Self-pay | Admitting: Oncology

## 2024-02-16 ENCOUNTER — Encounter: Payer: Self-pay | Admitting: Emergency Medicine

## 2024-02-16 ENCOUNTER — Other Ambulatory Visit: Payer: Self-pay

## 2024-02-16 ENCOUNTER — Emergency Department

## 2024-02-16 DIAGNOSIS — J020 Streptococcal pharyngitis: Secondary | ICD-10-CM | POA: Insufficient documentation

## 2024-02-16 DIAGNOSIS — R059 Cough, unspecified: Secondary | ICD-10-CM | POA: Diagnosis present

## 2024-02-16 LAB — RESP PANEL BY RT-PCR (RSV, FLU A&B, COVID)  RVPGX2
Influenza A by PCR: NEGATIVE
Influenza B by PCR: NEGATIVE
Resp Syncytial Virus by PCR: NEGATIVE
SARS Coronavirus 2 by RT PCR: NEGATIVE

## 2024-02-16 LAB — GROUP A STREP BY PCR: Group A Strep by PCR: DETECTED — AB

## 2024-02-16 NOTE — ED Triage Notes (Signed)
 Pt in with sore throat, cough and generalized malaise x 2 wks. Pt states it feels like something is "cutting" her throat whenever she swallows food or water. Denies any sob, and reports productive cough with white sputum

## 2024-02-17 ENCOUNTER — Emergency Department
Admission: EM | Admit: 2024-02-17 | Discharge: 2024-02-17 | Disposition: A | Discharge status: Home / Self Care | Attending: Emergency Medicine | Admitting: Emergency Medicine

## 2024-02-17 DIAGNOSIS — J020 Streptococcal pharyngitis: Secondary | ICD-10-CM

## 2024-02-17 MED ORDER — AMOXICILLIN 500 MG PO CAPS: 500.0000 mg | ORAL_CAPSULE | Freq: Two times a day (BID) | ORAL | 0 refills | Status: AC

## 2024-02-17 MED ORDER — BENZOCAINE 20 % MT AERO: 1.0000 | INHALATION_SPRAY | Freq: Three times a day (TID) | OROMUCOSAL | 0 refills | Status: AC | PRN

## 2024-02-17 NOTE — ED Provider Notes (Signed)
 [facility] Provider Note    Event Date/Time   First MD Initiated Contact with Patient 02/17/24 0211     (approximate)   History   Sore Throat and Cough   HPI  Carolyn [name] is a 43 year old female presenting to the emergency department for evaluation of sore throat.  Patient reports that over the past 2 weeks she has had a sore throat with intermittent cough and malaise.  Feels like something is "cutting" her throat when she swallows.  No associated chest pain, shortness of breath.  Recently around her nephew who was sick with similar symptoms.     Physical Exam   Triage Vital Signs: ED Triage Vitals  Encounter Vitals Group     BP 02/16/24 2223 111/69     Systolic BP Percentile --      Diastolic BP Percentile --      Pulse Rate 02/16/24 2223 86     Resp 02/16/24 2223 20     Temp 02/16/24 2223 98.3 F (36.8 C)     Temp Source 02/16/24 2223 Oral     SpO2 02/16/24 2223 100 %     Weight 02/16/24 2224 163 lb 9.6 oz (74.2 kg)     Height --      Head Circumference --      Peak Flow --      Pain Score 02/16/24 2223 10     Pain Loc --      Pain Education --      Exclude from Growth Chart --     Most recent vital signs: Vitals:   02/16/24 2223  BP: 111/69  Pulse: 86  Resp: 20  Temp: 98.3 F (36.8 C)  SpO2: 100%     General: Awake, interactive  HEENT: Swelling of bilateral tonsils without clear exudates.  Uvula midline without deviation, no swelling underneath the tongue, managing secretions without issue CV:  Regular rate, good peripheral perfusion.  Resp:  Unlabored respirations, lungs clear to auscultation Abd:  Nondistended.  Neuro:  Symmetric facial movement, fluid speech   ED Results / Procedures / Treatments   Labs (all labs ordered are listed, but only abnormal results are displayed) Labs Reviewed  GROUP A STREP BY PCR - Abnormal; Notable for the following components:      Result Value   Group A Strep by PCR  DETECTED (*)    All other components within normal limits  RESP PANEL BY RT-PCR (RSV, FLU A&B, COVID)  [id number]     EKG EKG independently reviewed interpreted by myself (ER attending) demonstrates:    RADIOLOGY Imaging independently reviewed and interpreted by myself demonstrates:  CXR without focal consolidation  Formal Radiology Read:  DG Chest 2 View Result Date: 02/16/2024 CLINICAL DATA:  Sore throat and productive cough EXAM: CHEST - 2 VIEW COMPARISON:  11/21/2023 FINDINGS: The heart size and mediastinal contours are within normal limits. Both lungs are clear. The visualized skeletal structures are unremarkable. IMPRESSION: No active cardiopulmonary disease. Electronically Signed   By: Violeta Grey M.D.   On: 02/16/2024 22:46    PROCEDURES:  Critical Care performed: No  Procedures   MEDICATIONS ORDERED IN ED: Medications - No data to display   IMPRESSION / MDM / ASSESSMENT AND PLAN / ED COURSE  I reviewed the triage vital signs and the nursing notes.  Differential diagnosis includes, but is not limited to, strep pharyngitis, viral illness, pneumonia  Patient's presentation is most consistent with acute complicated illness / injury  requiring diagnostic workup.  43 year old female presenting to the Emergency Department for evaluation of sore throat.  Stable vitals on presentation.  Viral swab sent from triage negative.  Strep test positive.  X-Rontae Inglett negative.  Discussed supportive care.  Prescription for amoxicillin  sent to patient's pharmacy.  Strict return precautions provided.  Patient discharged in stable condition.      FINAL CLINICAL IMPRESSION(S) / ED DIAGNOSES   Final diagnoses:  Strep pharyngitis     Rx / DC Orders   ED Discharge Orders          Ordered    amoxicillin  (AMOXIL ) 500 MG capsule  2 times daily        02/17/24 0245    Benzocaine (HURRCAINE) 20 % AERO  3 times daily PRN        02/17/24 0250             Note:  This document was  prepared using Dragon voice recognition software and may include unintentional dictation errors.   Claria Crofts, MD 02/17/24 [phone]

## 2024-02-17 NOTE — ED Notes (Signed)
Pt discharge information reviewed. Pt understands need for follow up care and when to return if symptoms worsen. All questions answered. Pt is alert and oriented with even and regular respirations. Pt is seen ambulating out of department with strong steady gait with family. ?

## 2024-02-17 NOTE — Discharge Instructions (Signed)
 Take your antibiotic as prescribed.  I have sent a prescription for numbing spray that you can take as needed to help with your symptoms.  Please make sure you are eating and drinking regularly.  Return to the ER for new or worsening symptoms.

## 2024-03-20 ENCOUNTER — Telehealth: Payer: Self-pay | Admitting: Family Medicine

## 2024-03-21 ENCOUNTER — Ambulatory Visit: Admitting: Licensed Clinical Social Worker

## 2024-04-19 ENCOUNTER — Telehealth: Payer: Self-pay | Admitting: Family Medicine

## 2024-04-28 ENCOUNTER — Encounter: Payer: Self-pay | Admitting: *Deleted

## 2024-04-28 ENCOUNTER — Observation Stay
Admission: EM | Admit: 2024-04-28 | Discharge: 2024-04-29 | Disposition: A | Discharge status: Home / Self Care | Attending: Obstetrics | Admitting: Obstetrics

## 2024-04-28 ENCOUNTER — Observation Stay

## 2024-04-28 ENCOUNTER — Emergency Department

## 2024-04-28 ENCOUNTER — Encounter: Payer: Self-pay | Admitting: Oncology

## 2024-04-28 ENCOUNTER — Other Ambulatory Visit: Payer: Self-pay

## 2024-04-28 DIAGNOSIS — N921 Excessive and frequent menstruation with irregular cycle: Secondary | ICD-10-CM | POA: Insufficient documentation

## 2024-04-28 DIAGNOSIS — J0100 Acute maxillary sinusitis, unspecified: Secondary | ICD-10-CM | POA: Insufficient documentation

## 2024-04-28 DIAGNOSIS — F12920 Cannabis use, unspecified with intoxication, uncomplicated: Secondary | ICD-10-CM | POA: Diagnosis not present

## 2024-04-28 DIAGNOSIS — D573 Sickle-cell trait: Secondary | ICD-10-CM | POA: Diagnosis not present

## 2024-04-28 DIAGNOSIS — D500 Iron deficiency anemia secondary to blood loss (chronic): Secondary | ICD-10-CM | POA: Diagnosis not present

## 2024-04-28 DIAGNOSIS — N920 Excessive and frequent menstruation with regular cycle: Secondary | ICD-10-CM | POA: Diagnosis present

## 2024-04-28 DIAGNOSIS — R42 Dizziness and giddiness: Secondary | ICD-10-CM | POA: Diagnosis present

## 2024-04-28 DIAGNOSIS — F17210 Nicotine dependence, cigarettes, uncomplicated: Secondary | ICD-10-CM | POA: Diagnosis not present

## 2024-04-28 LAB — CBC WITH DIFFERENTIAL/PLATELET
Abs Immature Granulocytes: 0.01 K/uL (ref 0.00–0.07)
Basophils Absolute: 0 K/uL (ref 0.0–0.1)
Basophils Relative: 0 %
Eosinophils Absolute: 0 K/uL (ref 0.0–0.5)
Eosinophils Relative: 1 %
HCT: 20.4 % — ABNORMAL LOW (ref 36.0–46.0)
Hemoglobin: 5.5 g/dL — ABNORMAL LOW (ref 12.0–15.0)
Immature Granulocytes: 0 %
Lymphocytes Relative: 38 %
Lymphs Abs: 2.5 K/uL (ref 0.7–4.0)
MCH: 17.1 pg — ABNORMAL LOW (ref 26.0–34.0)
MCHC: 27 g/dL — ABNORMAL LOW (ref 30.0–36.0)
MCV: 63.6 fL — ABNORMAL LOW (ref 80.0–100.0)
Monocytes Absolute: 0.3 K/uL (ref 0.1–1.0)
Monocytes Relative: 5 %
Neutro Abs: 3.7 K/uL (ref 1.7–7.7)
Neutrophils Relative %: 56 %
Platelets: 227 K/uL (ref 150–400)
RBC: 3.21 MIL/uL — ABNORMAL LOW (ref 3.87–5.11)
RDW: 23 % — ABNORMAL HIGH (ref 11.5–15.5)
WBC: 6.6 K/uL (ref 4.0–10.5)
nRBC: 0 % (ref 0.0–0.2)

## 2024-04-28 LAB — COMPREHENSIVE METABOLIC PANEL WITH GFR
ALT: 11 U/L (ref 0–44)
AST: 14 U/L — ABNORMAL LOW (ref 15–41)
Albumin: 3.4 g/dL — ABNORMAL LOW (ref 3.5–5.0)
Alkaline Phosphatase: 37 U/L — ABNORMAL LOW (ref 38–126)
Anion gap: 10 (ref 5–15)
BUN: 11 mg/dL (ref 6–20)
CO2: 21 mmol/L — ABNORMAL LOW (ref 22–32)
Calcium: 8.8 mg/dL — ABNORMAL LOW (ref 8.9–10.3)
Chloride: 110 mmol/L (ref 98–111)
Creatinine, Ser: 0.96 mg/dL (ref 0.44–1.00)
GFR, Estimated: >60 mL/min (ref >60)
Glucose, Bld: 98 mg/dL (ref 70–99)
Potassium: 3.2 mmol/L — ABNORMAL LOW (ref 3.5–5.1)
Sodium: 141 mmol/L (ref 135–145)
Total Bilirubin: 0.5 mg/dL (ref 0.0–1.2)
Total Protein: 6.5 g/dL (ref 6.5–8.1)

## 2024-04-28 LAB — URINALYSIS, ROUTINE W REFLEX MICROSCOPIC
Bilirubin Urine: NEGATIVE
Glucose, UA: NEGATIVE mg/dL
Ketones, ur: NEGATIVE mg/dL
Nitrite: NEGATIVE
Protein, ur: 30 mg/dL — AB
Specific Gravity, Urine: 1.015 (ref 1.005–1.030)
pH: 7 (ref 5.0–8.0)

## 2024-04-28 LAB — POC URINE PREG, ED: Preg Test, Ur: NEGATIVE

## 2024-04-28 LAB — PREPARE RBC (CROSSMATCH)

## 2024-04-28 LAB — GROUP A STREP BY PCR: Group A Strep by PCR: NOT DETECTED

## 2024-04-28 MED ORDER — ALUM & MAG HYDROXIDE-SIMETH 200-200-20 MG/5ML PO SUSP
30.0000 mL | Freq: Once | ORAL | Status: AC
  Administered 2024-04-28: 30 mL via ORAL
  Filled 2024-04-28: qty 30

## 2024-04-28 MED ORDER — IBUPROFEN 800 MG PO TABS: 800.0000 mg | ORAL_TABLET | Freq: Three times a day (TID) | ORAL | Status: DC | PRN

## 2024-04-28 MED ORDER — SODIUM CHLORIDE 0.9 % IV SOLN
10.0000 mL/h | Freq: Once | INTRAVENOUS | Status: AC
  Administered 2024-04-28: 10 mL/h via INTRAVENOUS

## 2024-04-28 MED ORDER — ONDANSETRON HCL 4 MG PO TABS: 4.0000 mg | ORAL_TABLET | Freq: Four times a day (QID) | ORAL | Status: DC | PRN

## 2024-04-28 MED ORDER — NORETHINDRONE ACETATE 5 MG PO TABS
10.0000 mg | ORAL_TABLET | Freq: Once | ORAL | Status: AC
  Administered 2024-04-28: 10 mg via ORAL
  Filled 2024-04-28: qty 2

## 2024-04-28 MED ORDER — POTASSIUM CHLORIDE 20 MEQ PO PACK
40.0000 meq | PACK | Freq: Once | ORAL | Status: AC
  Administered 2024-04-28: 40 meq via ORAL
  Filled 2024-04-28: qty 2

## 2024-04-28 MED ORDER — TRANEXAMIC ACID 650 MG PO TABS
1300.0000 mg | ORAL_TABLET | Freq: Two times a day (BID) | ORAL | Status: DC
  Administered 2024-04-28: 1300 mg via ORAL
  Filled 2024-04-28: qty 2

## 2024-04-28 MED ORDER — ONDANSETRON HCL 4 MG/2ML IJ SOLN: 4.0000 mg | Freq: Four times a day (QID) | INTRAMUSCULAR | Status: DC | PRN

## 2024-04-28 MED ORDER — LIDOCAINE VISCOUS HCL 2 % MT SOLN
15.0000 mL | Freq: Once | OROMUCOSAL | Status: AC
  Administered 2024-04-28: 15 mL via ORAL
  Filled 2024-04-28: qty 15

## 2024-04-28 NOTE — ED Provider Notes (Signed)
 [facility] Provider Note    Event Date/Time   First MD Initiated Contact with Patient 04/28/24 1854     (approximate)   History   Sore Throat    HPI  Carolyn [name] is a 43 y.o. female    with a past medical history of GERD, strep pharyngitis, lower abdominal pain, iron  deficiency anemia, bronchitis, uterine leiomyoma, calculus of gallbladder without cholecystitis, hypokalemia, cyst of right ovary, who presents to the ED complaining of sore throat. According to the patient, symptoms started 3 days ago with sore throat, facial pain described as a pressure, postnasal drip, odynophagia, chest pain after eating, weakness, dysuria.  Patient states having heavy vaginal bleeding in the last 3 weeks.  Patient is changing tampons every hour.  Patient states having history of anemia secondary to vaginal bleeding that required admissions and iron  transfusion.  Menarche: 43 years old, cycles 28 x 7, G1 P1 A0.  Patient denies vaginal discharge.      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     ROS: Patient currently denies any vision changes, tinnitus, difficulty speaking, facial droop,  shortness of breath, abdominal pain, nausea/vomiting/diarrhea,numbness/paresthesias in any extremity   Physical Exam   Triage Vital Signs: ED Triage Vitals [04/28/24 1845]  Encounter Vitals Group     BP      Girls Systolic BP Percentile      Girls  Diastolic BP Percentile      Boys Systolic BP Percentile      Boys Diastolic BP Percentile      Pulse      Resp      Temp      Temp src      SpO2      Weight 165 lb (74.8 kg)     Height 5' 8 (1.727 m)     Head Circumference      Peak Flow      Pain Score 10     Pain Loc      Pain Education      Exclude from Growth Chart     Most recent vital signs: Vitals:   04/28/24 1854  BP: 114/82  Pulse: 88  Resp: 20  Temp: 99.5 F (37.5 C)  SpO2: 100%    During triage vital signs are normal  Physical Exam Vitals and nursing note reviewed.   Constitutional:      General: Awake and alert. No acute distress.    Appearance: Normal appearance. The patient is normal weight.      Able to speak in complete sentences without cough or dyspnea  HENT:     Head: Normocephalic and atraumatic.  Face: Tenderness to palpation in maxillary sinuses    Mouth: Mucous membranes are moist.  Ears: Right ear: Otoscopy: Peritympanic erythema, no otorrhea, tympanic membranes is intact.  Left ear: Otoscopy: Mild PERI tympanic erythema, no otorrhea, tympanic membrane is permeable.  Eyes:  General: PERRL. Normal EOMs          Conjunctiva/sclera: Conjunctivae normal.  Hypochromic Nose No congestion/rhinorrhea  CV:                  Good peripheral perfusion.  Regular rate and rhythm  Resp:               Normal effort.  Equal breath sounds bilaterally.  Abd:                 No distention.  Soft, nontender.  No rebound or guarding.  Bilateral CVA negative Musculoskeletal:        General: No swelling. Normal range of motion.  Skin:    General: Skin is warm and dry.     Capillary Refill: Capillary refill takes less than 2 seconds.     Findings: No rash.  Neurological:     Mental Status: The patient is awake and alert. MAE spontaneously. No gross focal neurologic deficits are appreciated.  Psychiatric Mood and affect are normal. Speech and behavior are normal.  ED Results / Procedures / Treatments    Labs (all labs ordered are listed, but only abnormal results are displayed) Labs Reviewed  COMPREHENSIVE METABOLIC PANEL WITH GFR - Abnormal; Notable for the following components:      Result Value   Potassium 3.2 (*)    CO2 21 (*)    Calcium 8.8 (*)    Albumin 3.4 (*)    AST 14 (*)    Alkaline Phosphatase 37 (*)    All other components within normal limits  CBC WITH DIFFERENTIAL/PLATELET - Abnormal; Notable for the following components:   RBC 3.21 (*)    Hemoglobin 5.5 (*)    HCT 20.4 (*)    MCV 63.6 (*)    MCH 17.1 (*)    MCHC 27.0 (*)    RDW 23.0 (*)    All other components within normal limits  URINALYSIS, ROUTINE W REFLEX MICROSCOPIC - Abnormal; Notable for the following components:   Color, Urine YELLOW (*)    APPearance HAZY (*)    Hgb urine dipstick LARGE (*)    Protein, ur 30 (*)    Leukocytes,Ua TRACE (*)    Bacteria, UA RARE (*)    All other components within normal limits  GROUP A STREP BY PCR  POC URINE PREG, ED  PREPARE RBC (CROSSMATCH)  TYPE AND SCREEN     EKG See physician read  Vent. rate 83 BPM PR interval 156 ms QRS duration 72 ms QT/QTcB 382/448 ms P-R-T axes 60 66 29 Normal sinus rhythm Normal ECG When compared with ECG of 21-Nov-2023 17:07, No significant change was found Unconfirmed   RADIOLOGY I independently reviewed and interpreted imaging and agree with radiologists findings.      PROCEDURES:  Critical Care performed:   Procedures   MEDICATIONS ORDERED IN ED: Medications  tranexamic acid  (LYSTEDA ) tablet 1,300 mg (1,300 mg Oral Given 04/28/24 2132)  alum & mag hydroxide-simeth (MAALOX/MYLANTA) 200-200-20 MG/5ML suspension 30 mL (30 mLs Oral Given 04/28/24 1935)    And  lidocaine  (XYLOCAINE ) 2 % viscous mouth solution 15 mL (15 mLs Oral Given 04/28/24 1935)  potassium chloride  (KLOR-CON ) packet 40 mEq (40 mEq Oral Given 04/28/24 2108)  0.9 %  sodium chloride  infusion (10 mL/hr Intravenous New Bag/Given 04/28/24 2112)   norethindrone  (AYGESTIN ) tablet 10 mg (10 mg Oral Given 04/28/24 2133)   Clinical Course as of 04/28/24 2159  Sun Apr 28, 2024  1938 Group A Strep by PCR ([facility] Only) Negative [AE]  2011 CBC with Differential(!) Hemoglobin 5.5 5 months ago was 7.5.  RDW elevated [AE]  2012 Comprehensive metabolic panel(!) Hypokalemia, potassium 3.2, hypoalbuminemia, 3.4.  Anion gap within normal limits, AST and alkaline phosphate decreased.  [AE]  2043 Patient evaluated, hemodynamically stable, not having massive exsanguination though has been having steady vaginal bleeding for about 3 weeks.  Symptomatic anemia with fatigue, mild shortness of breath and chest tightness.  Well-appearing.  Consented for blood transfusion, risks and benefits discussed in detail.  OB/GYN consulted, spoke with Eleanor Canny, CNM, recommends administration of 10 mg Aygestin  (will provide further titration plan shortly), TXA 1300 mg oral twice daily for 5 days, and agrees admission for observation to ensure bleeding is controlled and hemoglobin is not dropping is very reasonable.  Transfusing 2 units PRBCs. [MM]  2050 Consulted Dr. Clarine who came to evaluate the patient.  He consulted OB/GYN.  OB/GYN suggested: Aygestin  10 mg BID x 5 days, then 10 mg daily until f/u appt. TXA 1300 mg TID x 3-5 days.  And follow-up with them in a week.  Consulted hospitalist for admission.  [AE]  2108 DG Chest 2 View No active cardiopulmonary disease. [AE]  2119 POC urine preg, ED Negative [AE]  2120 Urinalysis, Routine w reflex microscopic -Urine, Clean Catch(!) Hemoglobin large, protein presence, leukocytes trace, bacteria is rare, squamous epithelial cells 11-20.  UA is contaminated [AE]  2154 Consulted hospitalist who advised admission by OB/GYN.  OB/GYN was consulted they admitted the patient.  I did order complete pelvic US .  Updated patient [AE]    Clinical Course User Index [AE] Misako Roeder, PA-C [MM] Clarine Ozell LABOR, MD     IMPRESSION / MDM / ASSESSMENT AND PLAN / ED COURSE  I reviewed the triage vital signs and the nursing notes.  Differential diagnosis includes, but is not limited to, sinus infection, GERD, ACS, anemia, metrorrhagia, UTI.  Unlikely myocardial infarction.   Patient's presentation is most consistent with acute complicated illness / injury requiring diagnostic workup.    Carolyn [name] is a 43 y.o., female who presents today with history of 3 days of odynophagia, facial pain described as a pressure, chest pain after eating, dysuria, weakness, and metrorrhagia for 3 weeks.  Patient has history of iron  deficiency anemia secondary to metrorrhagia.  At physical exam there is evidence of hypochromic sclera, tenderness to palpation in maxillary sinuses, bilateral  peritympanic erythema.  Plan: CBC, CMP, UA, pregnancy test, chest x-ray, troponins, EKG. Maalox with lidocaine , Augmentin for sinus infection. CBC shows a hemoglobin of 5.5, potassium 3.2, UA was contaminated, pregnancy test was negative, checks x-ray within normal limits, EKG was normal, group a strep was negative.  Type and screen are pending.  Reassessed and updated the patient with results.  Consulted Dr. Clarine, who personally evaluated the patient, consulted hospitalist who recommended admission by OB/GYN.  Consulted OB/GYN who admitted the patient.  Ordered complete pelvic US , patient is going to receive 2 units of red blood cells,  updated patient about the plan Discussed plan of care with patient, answered all of patient's questions.  Patient verbalized understanding.     FINAL CLINICAL IMPRESSION(S) / ED DIAGNOSES   Final diagnoses:  Chronic blood loss anemia  Acute maxillary sinusitis, recurrence not specified     Rx / DC Orders   ED Discharge Orders     None        Note:  This document was prepared  using Conservation officer, historic buildings and may include unintentional dictation errors.   Janit Kast, PA-C 04/28/24 2315    Mian, Michael A, MD 04/29/24 [phone]

## 2024-04-28 NOTE — Plan of Care (Signed)
 Transferred to Room 335. Alert and oriented with pleasant affect. Color sl. Pale. Skin w7d. Cap. Refill < 2 seconds. Oriented to Room, Safety and Security and POC. Pt. V/o.

## 2024-04-28 NOTE — ED Triage Notes (Signed)
 Pt ambulatory to triage.  Pt has sore throat and bil earache.  Pt reports sinus congestion   pt alert.

## 2024-04-28 NOTE — Progress Notes (Signed)
 2nd IV not needed at this time per primary RN.

## 2024-04-28 NOTE — ED Notes (Signed)
 Pt states she has been having a sore throat, productive cough, congestion, chest tightness and off and on fevers for a few days now. Pt is unsure if she has been around anyone that has been sick. Furthermore the pt also states that she has been having vag bleeding for the past 3 weeks.  She states she it bright red with signs of clots and has had to change her pad multiple times.  Pt denies the possibility of being pregnant at this time.

## 2024-04-28 NOTE — ED Notes (Signed)
 Pt triaged for sore throat and given acuity of 4 but pt has had heavy vaginal bleeding for 3 weeks and has a critical low HGB of 5.5 and requires blood transfusion. Pt changed to acuity of 2 by provider request.

## 2024-04-29 ENCOUNTER — Other Ambulatory Visit: Payer: Self-pay | Admitting: Certified Nurse Midwife

## 2024-04-29 ENCOUNTER — Telehealth: Payer: Self-pay | Admitting: Obstetrics

## 2024-04-29 LAB — CBC
HCT: 29.1 % — ABNORMAL LOW (ref 36.0–46.0)
HCT: 28.7 % — ABNORMAL LOW (ref 36.0–46.0)
Hemoglobin: 8.6 g/dL — ABNORMAL LOW (ref 12.0–15.0)
Hemoglobin: 8.5 g/dL — ABNORMAL LOW (ref 12.0–15.0)
MCH: 20.4 pg — ABNORMAL LOW (ref 26.0–34.0)
MCH: 20.4 pg — ABNORMAL LOW (ref 26.0–34.0)
MCHC: 29.6 g/dL — ABNORMAL LOW (ref 30.0–36.0)
MCHC: 29.6 g/dL — ABNORMAL LOW (ref 30.0–36.0)
MCV: 69.1 fL — ABNORMAL LOW (ref 80.0–100.0)
MCV: 68.8 fL — ABNORMAL LOW (ref 80.0–100.0)
Platelets: 230 K/uL (ref 150–400)
Platelets: 206 K/uL (ref 150–400)
RBC: 4.21 MIL/uL (ref 3.87–5.11)
RBC: 4.17 MIL/uL (ref 3.87–5.11)
RDW: 26.1 % — ABNORMAL HIGH (ref 11.5–15.5)
RDW: 25.6 % — ABNORMAL HIGH (ref 11.5–15.5)
WBC: 6.2 K/uL (ref 4.0–10.5)
WBC: 6 K/uL (ref 4.0–10.5)
nRBC: 0 % (ref 0.0–0.2)
nRBC: 0 % (ref 0.0–0.2)

## 2024-04-29 MED ORDER — NORETHINDRONE ACETATE 5 MG PO TABS: 5.0000 mg | ORAL_TABLET | Freq: Two times a day (BID) | ORAL | 1 refills | Status: AC

## 2024-04-29 MED ORDER — NORETHINDRONE ACETATE 5 MG PO TABS: 10.0000 mg | ORAL_TABLET | Freq: Two times a day (BID) | ORAL | 1 refills | Status: DC

## 2024-04-29 MED ORDER — FLUTICASONE PROPIONATE 50 MCG/ACT NA SUSP: 2.0000 | Freq: Every day | NASAL | 2 refills | Status: DC

## 2024-04-29 MED ORDER — NORETHINDRONE ACETATE 5 MG PO TABS
10.0000 mg | ORAL_TABLET | Freq: Two times a day (BID) | ORAL | Status: DC
  Administered 2024-04-29: 10 mg via ORAL
  Filled 2024-04-29: qty 2

## 2024-04-29 MED ORDER — TRANEXAMIC ACID 650 MG PO TABS: 1300.0000 mg | ORAL_TABLET | Freq: Three times a day (TID) | ORAL | 0 refills | Status: DC

## 2024-04-29 MED ORDER — SALINE SPRAY 0.65 % NA SOLN: 1.0000 | NASAL | Status: DC | PRN

## 2024-04-29 MED ORDER — IBUPROFEN 800 MG PO TABS: 800.0000 mg | ORAL_TABLET | Freq: Three times a day (TID) | ORAL | 0 refills | Status: DC | PRN

## 2024-04-29 MED ORDER — TRANEXAMIC ACID 650 MG PO TABS
1300.0000 mg | ORAL_TABLET | Freq: Three times a day (TID) | ORAL | Status: DC
  Administered 2024-04-29: 1300 mg via ORAL
  Filled 2024-04-29 (×2): qty 2

## 2024-04-29 MED ORDER — FLUTICASONE PROPIONATE 50 MCG/ACT NA SUSP
2.0000 | Freq: Every day | NASAL | Status: DC
  Filled 2024-04-29: qty 16

## 2024-04-29 MED ORDER — IRON SUCROSE 200 MG IVPB - SIMPLE MED
200.0000 mg | Freq: Once | Status: AC
  Administered 2024-04-29: 200 mg via INTRAVENOUS
  Filled 2024-04-29: qty 200

## 2024-04-29 MED ORDER — PANTOPRAZOLE SODIUM 40 MG PO TBEC
40.0000 mg | DELAYED_RELEASE_TABLET | Freq: Every day | ORAL | Status: DC
  Administered 2024-04-29: 40 mg via ORAL
  Filled 2024-04-29: qty 1

## 2024-04-29 NOTE — Telephone Encounter (Signed)
 Reached out to pt to reschedule ER f/u for heavy bleeding per EMERSON Canny.  Available appt on 05/03/2024 at 3:55 with EMERSON Canny.  Left message for pt to call back to schedule.

## 2024-04-29 NOTE — Progress Notes (Signed)
   04/29/24 0615  Spiritual Encounters  Type of Visit Attempt (pt unavailable)  Care provided to: Pt not available  Conversation partners present during encounter Nurse  Referral source Nurse (RN/NT/LPN)  Reason for visit Advance directives  OnCall Visit Yes   Chaplain went to Unit in response to a [place] Consult in the EPIC system for an AD.  Staff shared patient had not rested well and had recently gone to sleep.  Staff asked Chaplain if spiritual care could come back after 8AM to discuss AD and shared patient would likely be discharged this afternoon.  Chaplain shared she'd leave the Consult open to see if her colleague could visit the patient at shift change.    Rev. Rana M. Nicholaus, M.Div. Chaplain Resident  [facility]

## 2024-04-29 NOTE — H&P (Signed)
 Carolyn [name] is an 43 y.o. female admitted for observation and management of heavy menstrual bleeding. She reports that for the past 3 weeks, she has intermittent heavy vaginal bleeding with large clots. She began feeling lightheaded. Her hgb on admission to the ED was 5.5. She has a h/o chronic anemia. She has known fibroids, and US  report from ED shows fibroids have enlarged since her prior exam (2022). She denies a h/o migraines or DVT.  Pertinent Gynecological History: Contraception: Depo-Provera  injections Sexually transmitted diseases: no past history Last pap: normal Date: 01/2021 OB History: G1, P1001   Menstrual History: Patient's last menstrual period was 04/28/2024.    Past Medical History:  Diagnosis Date   Anemia    Fibroids    GERD (gastroesophageal reflux disease)    Sickle cell trait (HCC)    Ulcer of abdomen wall [facility])     Past Surgical History:  Procedure Laterality Date   BIOPSY  11/22/2023   Procedure: BIOPSY;  Surgeon: Jinny Carmine, MD;  Location: [facility] ENDOSCOPY;  Service: Endoscopy;;   ESOPHAGOGASTRODUODENOSCOPY N/A 11/22/2023   Procedure: ESOPHAGOGASTRODUODENOSCOPY (EGD);  Surgeon: Jinny Carmine, MD;  Location: [facility] ENDOSCOPY;  Service: Endoscopy;  Laterality: N/A;   ESOPHAGOGASTRODUODENOSCOPY (EGD) WITH PROPOFOL  N/A 02/26/2016   Procedure: ESOPHAGOGASTRODUODENOSCOPY (EGD) WITH PROPOFOL ;  Surgeon: Carmine Jinny, MD;  Location: [facility] ENDOSCOPY;  Service: Endoscopy;  Laterality: N/A;   ESOPHAGOGASTRODUODENOSCOPY (EGD) WITH PROPOFOL  N/A 08/22/2022   Procedure: ESOPHAGOGASTRODUODENOSCOPY (EGD) WITH PROPOFOL ;  Surgeon: Toledo, Ladell POUR, MD;  Location: [facility] ENDOSCOPY;  Service: Gastroenterology;  Laterality: N/A;   HEMOSTASIS CLIP PLACEMENT  11/22/2023   Procedure: HEMOSTASIS CLIP PLACEMENT;  Surgeon: Jinny Carmine, MD;  Location: [facility] ENDOSCOPY;  Service: Endoscopy;;   WISDOM TOOTH EXTRACTION Bilateral     Family History  Problem Relation Age of Onset    Diabetes Mother    Hypertension Mother    Lung cancer Father    Leukemia Father    Lung cancer Maternal Grandmother    Colon cancer Maternal Grandfather    Cancer Paternal Grandmother    Cancer Paternal Grandfather     Social History:  reports that she has been smoking cigarettes. She started smoking about 27 years ago. She has a 52 pack-year smoking history. She has never used smokeless tobacco. She reports that she does not currently use alcohol. She reports that she does not currently use drugs after having used the following drugs: Marijuana.  Allergies: No Known Allergies  Facility-Administered Medications Prior to Admission  Medication Dose Route Frequency Provider Last Rate Last Admin   medroxyPROGESTERone  (DEPO-PROVERA ) injection 150 mg  150 mg Intramuscular Q90 days    150 mg at 02/13/24 1430   Medications Prior to Admission  Medication Sig Dispense Refill Last Dose/Taking   omeprazole  (PRILOSEC) 20 MG capsule Take 20 mg by mouth daily. 2 capsules daily   Taking   pantoprazole  (PROTONIX ) 40 MG tablet Take 1 tablet (40 mg total) by mouth 2 (two) times daily. 60 tablet 0 Taking   ferrous sulfate  325 (65 FE) MG EC tablet Take 1 tablet (325 mg total) by mouth 2 (two) times daily. 60 tablet 0    nicotine  (NICODERM CQ  - DOSED IN MG/24 HOURS) 21 mg/24hr patch One patch chest wall daily (okay to substitute generic) (Patient not taking: Reported on 02/13/2024) 28 patch 0     Review of Systems  Blood pressure 113/78, pulse 77, temperature 98.4 F (36.9 C), temperature source Oral, resp. rate 20, height 5' 8 (1.727 m), weight 74.8  kg, last menstrual period 04/28/2024, SpO2 100%. Physical Exam General: Alert, cooperative, NAD Cardiac: RRR Lungs: CTAB Abdomen: soft, non-tender, normal BS Vaginal bleeding is minimal  US  Pelvis Complete Result Date: 04/28/2024 CLINICAL DATA:  Abnormal uterine bleeding EXAM: TRANSABDOMINAL ULTRASOUND OF PELVIS TECHNIQUE: Transabdominal ultrasound  examination of the pelvis was performed including evaluation of the uterus, ovaries, adnexal regions, and pelvic cul-de-sac. COMPARISON:  04/06/2022 FINDINGS: Uterus Measurements: 11.2 x 9.7 x 9 cm = volume: 518 mL. Innumerable fibroids throughout the uterus, the largest measuring 5.2 cm. Endometrium Thickness: 6 mm in thickness.  No focal abnormality visualized. Right ovary Measurements: 3.6 x 2.5 x 2.0 cm = volume: 10 mL. Normal appearance/no adnexal mass. Left ovary Measurements: Not visualized.  No adnexal mass seen. Other findings:  No abnormal free fluid. IMPRESSION: Enlarged fibroid uterus with numerous fibroids measuring up to 5.2 cm. Electronically Signed   By: Franky Crease M.D.   On: 04/28/2024 22:41   DG Chest 2 View Result Date: 04/28/2024 CLINICAL DATA:  Sore throat and chest pain, initial encounter EXAM: CHEST - 2 VIEW COMPARISON:  02/16/2024 FINDINGS: The heart size and mediastinal contours are within normal limits. Both lungs are clear. The visualized skeletal structures are unremarkable. IMPRESSION: No active cardiopulmonary disease. Electronically Signed   By: Oneil Devonshire M.D.   On: 04/28/2024 19:57    Assessment/Plan: 2 U PRBC infusing Aygestin  and TXA for bleeding management CBC in AM Anticipate d/c in AM if stable with close f/u  Carolyn [name] 04/29/2024, 12:54 AM

## 2024-04-29 NOTE — Discharge Summary (Signed)
 Discharge Summary   Patient ID: Carolyn [name] [id number] 43 y.o. January 27, 1981  Admit date: 04/28/2024  Discharge date and time: No discharge date for patient encounter.   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 She is tolerating a regular diet. Voiding without difficulty. Her vaginal bleeding has stopped. She began Aygestin  & received TXA. She will defer her next Depo and continue BID Aygestin .  Consults: None  Significant Diagnostic Studies: labs:  Results for orders placed or performed during the hospital encounter of 04/28/24 (from the past 24 hours)  Group A Strep by PCR ([facility] Only)     Status: None   Collection Time: 04/28/24  6:55 PM   Specimen: Throat; Sterile Swab  Result Value Ref Range   Group A Strep by PCR NOT DETECTED NOT DETECTED  Comprehensive metabolic panel     Status: Abnormal   Collection Time: 04/28/24  7:34 PM  Result Value Ref Range   Sodium 141 135 - 145 mmol/L   Potassium 3.2 (L) 3.5 - 5.1 mmol/L   Chloride 110 98 - 111 mmol/L   CO2 21 (L) 22 - 32 mmol/L   Glucose, Bld 98 70 - 99 mg/dL   BUN 11 6 - 20 mg/dL   Creatinine, Ser 9.03 0.44 - 1.00 mg/dL   Calcium 8.8 (L) 8.9 - 10.3 mg/dL   Total Protein 6.5 6.5 - 8.1 g/dL   Albumin 3.4 (L) 3.5 - 5.0 g/dL   AST 14 (L) 15 - 41 U/L    ALT 11 0 - 44 U/L   Alkaline Phosphatase 37 (L) 38 - 126 U/L   Total Bilirubin 0.5 0.0 - 1.2 mg/dL   GFR, Estimated >39 >39 mL/min   Anion gap 10 5 - 15  CBC with Differential     Status: Abnormal   Collection Time: 04/28/24  7:34 PM  Result Value Ref Range   WBC 6.6 4.0 - 10.5 K/uL   RBC 3.21 (L) 3.87 - 5.11 MIL/uL   Hemoglobin 5.5 (L) 12.0 - 15.0 g/dL   HCT 79.5 (L) 63.9 - 53.9 %   MCV 63.6 (L) 80.0 - 100.0 fL   MCH 17.1 (L) 26.0 - 34.0 pg   MCHC 27.0 (L) 30.0 - 36.0 g/dL   RDW 76.9 (H) 88.4 - 84.4 %   Platelets 227 150 - 400 K/uL   nRBC 0.0 0.0 - 0.2 %   Neutrophils Relative % 56 %   Neutro Abs 3.7 1.7 - 7.7 K/uL   Lymphocytes Relative 38 %   Lymphs Abs 2.5 0.7 - 4.0 K/uL   Monocytes Relative 5 %   Monocytes Absolute 0.3 0.1 - 1.0 K/uL   Eosinophils Relative 1 %   Eosinophils Absolute 0.0 0.0 - 0.5 K/uL   Basophils Relative 0 %   Basophils Absolute 0.0 0.0 - 0.1 K/uL   RBC Morphology See Note    Immature Granulocytes 0 %   Abs Immature Granulocytes 0.01 0.00 -  0.07 K/uL   Acanthocytes PRESENT    Tear Drop Cells PRESENT    Polychromasia PRESENT    Target Cells PRESENT    Ovalocytes PRESENT   Urinalysis, Routine w reflex microscopic -Urine, Clean Catch     Status: Abnormal   Collection Time: 04/28/24  7:34 PM  Result Value Ref Range   Color, Urine YELLOW (A) YELLOW   APPearance HAZY (A) CLEAR   Specific Gravity, Urine 1.015 1.005 - 1.030   pH 7.0 5.0 - 8.0   Glucose, UA NEGATIVE NEGATIVE mg/dL   Hgb urine dipstick LARGE (A) NEGATIVE   Bilirubin Urine NEGATIVE NEGATIVE   Ketones, ur NEGATIVE NEGATIVE mg/dL   Protein, ur 30 (A) NEGATIVE mg/dL   Nitrite NEGATIVE NEGATIVE   Leukocytes,Ua TRACE (A) NEGATIVE   RBC / HPF 0-5 0 - 5 RBC/hpf   WBC, UA 0-5 0 - 5 WBC/hpf   Bacteria, UA RARE (A) NONE SEEN   Squamous Epithelial / HPF 11-20 0 - 5 /HPF   Mucus PRESENT   Prepare RBC (crossmatch)     Status: None   Collection Time: 04/28/24  8:24 PM  Result Value Ref Range    Order Confirmation      ORDER PROCESSED BY BLOOD BANK Performed at [facility], [address] [place], [place] [address]   Type and screen [facility]     Status: None (Preliminary result)   Collection Time: 04/28/24  8:50 PM  Result Value Ref Range   ABO/RH(D) A POS    Antibody Screen NEG    Sample Expiration 05/01/2024,2359    Unit Number [id number]    Blood Component Type RED CELLS,LR    Unit division 00    Status of Unit ISSUED    Transfusion Status OK TO TRANSFUSE    Crossmatch Result Compatible    Unit Number [id number]    Blood Component Type RED CELLS,LR    Unit division 00    Status of Unit ISSUED,FINAL    Transfusion Status OK TO TRANSFUSE    Crossmatch Result      Compatible Performed at [facility], [address] [place], [place] [address]   POC urine preg, ED     Status: None   Collection Time: 04/28/24  9:13 PM  Result Value Ref Range   Preg Test, Ur Negative Negative  CBC     Status: Abnormal   Collection Time: 04/29/24  4:20 AM  Result Value Ref Range   WBC 6.2 4.0 - 10.5 K/uL   RBC 4.21 3.87 - 5.11 MIL/uL   Hemoglobin 8.6 (L) 12.0 - 15.0 g/dL   HCT 70.8 (L) 63.9 - 53.9 %   MCV 69.1 (L) 80.0 - 100.0 fL   MCH 20.4 (L) 26.0 - 34.0 pg   MCHC 29.6 (L) 30.0 - 36.0 g/dL   RDW 73.8 (H) 88.4 - 84.4 %   Platelets 230 150 - 400 K/uL   nRBC 0.0 0.0 - 0.2 %  CBC     Status: Abnormal   Collection Time: 04/29/24 10:36 AM  Result Value Ref Range   WBC 6.0 4.0 - 10.5 K/uL   RBC 4.17 3.87 - 5.11 MIL/uL   Hemoglobin 8.5 (L) 12.0 - 15.0 g/dL   HCT 71.2 (L) 63.9 - 53.9 %   MCV 68.8 (L) 80.0 - 100.0 fL   MCH 20.4 (L) 26.0 - 34.0 pg   MCHC 29.6 (L) 30.0 - 36.0 g/dL   RDW 74.3 (H) 88.4 -  15.5 %   Platelets 206 150 - 400 K/uL   nRBC 0.0 0.0 - 0.2 %      Discharge Exam: BP 118/79 (BP Location: Left Arm)   Pulse 69   Temp 98.5 F (36.9 C) (Oral)   Resp 18   Ht 5' 8 (1.727 m)   Wt 74.8 kg   LMP 04/28/2024    SpO2 100%   BMI 25.09 kg/m  General appearance: alert, cooperative, appears stated age, and no distress Lungs: clear to auscultation bilaterally Heart: regular rate and rhythm, S1, S2 normal, no murmur, click, rub or gallop Abdomen: soft, non-tender; bowel sounds normal; no masses,  no organomegaly Pelvic: external genitalia normal and vaginal bleeding absent.  Disposition: Discharge disposition: 01-Home or Self Care       Patient Instructions:  Allergies as of 04/29/2024   No Known Allergies      Medication List     STOP taking these medications    ferrous sulfate  325 (65 FE) MG EC tablet   nicotine  21 mg/24hr patch Commonly known as: NICODERM CQ  - dosed in mg/24 hours   omeprazole  20 MG capsule Commonly known as: PRILOSEC       TAKE these medications    fluticasone  50 MCG/ACT nasal spray Commonly known as: FLONASE  Place 2 sprays into both nostrils daily.   ibuprofen  800 MG tablet Commonly known as: ADVIL  Take 1 tablet (800 mg total) by mouth every 8 (eight) hours as needed (mild pain).   norethindrone  5 MG tablet Commonly known as: AYGESTIN  Take 2 tablets (10 mg total) by mouth 2 (two) times daily.   pantoprazole  40 MG tablet Commonly known as: PROTONIX  Take 1 tablet (40 mg total) by mouth 2 (two) times daily.   tranexamic acid  650 MG Tabs tablet Commonly known as: LYSTEDA  Take 2 tablets (1,300 mg total) by mouth 3 (three) times daily.       Activity: activity as tolerated Diet: regular diet Wound Care: none needed  Follow-up with [facility] in 1 week.  Signed: Harlene LITTIE [name] 04/29/2024 12:23 PM

## 2024-04-29 NOTE — Discharge Instructions (Addendum)
 Take Aygestin /norethindrone  10mg  bid to suppress bleeding. Skip scheduled Depo injection. Take TXA orally with heavy bleeding, but do not take for scant/light bleeding. Continue Ibuprofen  for cramping.

## 2024-04-29 NOTE — Progress Notes (Signed)
 Patient discharged. Discharge instructions and prescriptions given and reviewed with patient. Patient verbalized understanding. Pt walked off unit with family member.

## 2024-04-30 LAB — TYPE AND SCREEN
ABO/RH(D): A POS
Antibody Screen: NEGATIVE
Unit division: 0
Unit division: 0

## 2024-04-30 LAB — BPAM RBC
Blood Product Expiration Date: 202508262359
ISSUE DATE / TIME: 202507272250
ISSUE DATE / TIME: 202507280157
ISSUE DATE / TIME: 202508262359
Unit Type and Rh: 202508262359
Unit Type and Rh: 202508262359
Unit Type and Rh: 6200
Unit Type and Rh: 6200

## 2024-05-01 ENCOUNTER — Ambulatory Visit

## 2024-05-01 NOTE — Telephone Encounter (Signed)
 Pt is scheduled on 05/16/2024 with DOROTHA Cisco.

## 2024-05-10 ENCOUNTER — Other Ambulatory Visit: Payer: Self-pay | Admitting: *Deleted

## 2024-05-10 DIAGNOSIS — D500 Iron deficiency anemia secondary to blood loss (chronic): Secondary | ICD-10-CM

## 2024-05-13 ENCOUNTER — Inpatient Hospital Stay: Attending: Oncology

## 2024-05-13 ENCOUNTER — Inpatient Hospital Stay: Admitting: Oncology

## 2024-05-16 ENCOUNTER — Ambulatory Visit: Admitting: Certified Nurse Midwife

## 2024-05-20 NOTE — Progress Notes (Deleted)
    GYNECOLOGY PROGRESS NOTE  Subjective:  PCP: Carolyn [name], No Pcp Per  Carolyn [name] ID: Carolyn [name], female    DOB: 07-22-1981, 43 y.o.   MRN: [id number]  HPI  Carolyn [name] is a 43 y.o. G41P1001 female who presents for ED follow-up.  Carolyn [name] presented to [facility] ED on 04/02/24 with 2 days of vaginal bleeding soaking 2 pads/day.  Hgb 7.1.  Carolyn [name] discharged home.  She presented to [facility] ED 04/28/24 for anemia due to blood loss. She was evaluated by Harlene Cisco CNM and Missy Swanson CNM.  Hgb 5.5 at admission.  Carolyn [name] given 2 units PRBC, IV venofer , TXA and Aygestin  BID.  Hgb 8.6 following transfusion.  TVUS showed enlarged uterus with numerous fibroids.  She was told to follow-up with [facility] in one week.    {Common ambulatory SmartLinks:19316}  Review of Systems {ros; complete:30496}   Objective:   Last menstrual period 04/28/2024. There is no height or weight on file to calculate BMI.  General appearance: {general exam:16600} Abdomen: {abdominal exam:16834} Pelvic: {pelvic exam:16852::cervix normal in appearance,external genitalia normal,no adnexal masses or tenderness,no cervical motion tenderness,rectovaginal septum normal,uterus normal size, shape, and consistency,vagina normal without discharge} Extremities: {extremity exam:5109} Neurologic: {neuro exam:17854}    EXAM: TRANSABDOMINAL ULTRASOUND OF PELVIS   TECHNIQUE: Transabdominal ultrasound examination of the pelvis was performed including evaluation of the uterus, ovaries, adnexal regions, and pelvic cul-de-sac.   COMPARISON:  04/06/2022   FINDINGS: Uterus   Measurements: 11.2 x 9.7 x 9 cm = volume: 518 mL. Innumerable fibroids throughout the uterus, the largest measuring 5.2 cm.   Endometrium   Thickness: 6 mm in thickness.  No focal abnormality visualized.   Right ovary   Measurements: 3.6 x 2.5 x 2.0 cm = volume: 10 mL. Normal appearance/no adnexal mass.   Left ovary   Measurements: Not  visualized.  No adnexal mass seen.   Other findings:  No abnormal free fluid.   IMPRESSION: Enlarged fibroid uterus with numerous fibroids measuring up to 5.2 cm.     Electronically Signed   By: Franky Crease M.D.   On: 04/28/2024 22:41   Assessment/Plan:   No diagnosis found.   There are no diagnoses linked to this encounter.     Estil Mangle, DO [facility]

## 2024-05-21 ENCOUNTER — Ambulatory Visit: Admitting: Obstetrics

## 2024-05-28 ENCOUNTER — Ambulatory Visit: Admitting: Obstetrics

## 2024-06-12 NOTE — Progress Notes (Deleted)
    GYNECOLOGY PROGRESS NOTE  Subjective:  PCP: Patient, No Pcp Per  Patient ID: Carolyn [name], female    DOB: 01/25/1981, 43 y.o.   MRN: [id number]  HPI  Patient is a 43 y.o. G40P1001 female who presents for follow-up of chronic anemia due to vaginal bleeding.  Patient was started on Depo Provera  for contraception at [facility] 02/13/24.  She missed her next period and then started to experience heavy vaginal bleeding in July.  She went to the ED 04/11/24 for vaginal bleeding soaking 2 pads a day for 2 days.  Patient was concerned because when she would go from lying down to standing she would have a large gush of bleeding. At that time her hgb was 7.1 (unclear in note what intervention was provided).   She returned to the ED 7/27 for a sore throat and her hgb was found to be 5.5.  She was given 2 units of blood and assessed by Lindle Canny CNM and Harlene Cisco CNM,  She was given txa and started on Aygestin  bid.  Pelvic ultrasound done that noted numerous fibroids up to 5.2cm.  {Common ambulatory SmartLinks:19316}  Review of Systems {ros; complete:30496}   Objective:   There were no vitals taken for this visit. There is no height or weight on file to calculate BMI.  General appearance: {general exam:16600} Abdomen: {abdominal exam:16834} Pelvic: {pelvic exam:16852::cervix normal in appearance,external genitalia normal,no adnexal masses or tenderness,no cervical motion tenderness,rectovaginal septum normal,uterus normal size, shape, and consistency,vagina normal without discharge} Extremities: {extremity exam:5109} Neurologic: {neuro exam:17854}  TVUS 04/28/24 FINDINGS: Uterus   Measurements: 11.2 x 9.7 x 9 cm = volume: 518 mL. Innumerable fibroids throughout the uterus, the largest measuring 5.2 cm.   Endometrium   Thickness: 6 mm in thickness.  No focal abnormality visualized.   Right ovary   Measurements: 3.6 x 2.5 x 2.0 cm = volume: 10 mL.  Normal appearance/no adnexal mass.   Left ovary   Measurements: Not visualized.  No adnexal mass seen.   Other findings:  No abnormal free fluid.   IMPRESSION: Enlarged fibroid uterus with numerous fibroids measuring up to 5.2 cm.  Assessment/Plan:   No diagnosis found.   There are no diagnoses linked to this encounter.     Estil Mangle, DO [facility]

## 2024-06-18 ENCOUNTER — Ambulatory Visit: Admitting: Obstetrics

## 2024-07-15 NOTE — Progress Notes (Unsigned)
    GYNECOLOGY PROGRESS NOTE  Subjective:  PCP: Carolyn [name], No Pcp Per  Carolyn [name] ID: Carolyn [name], female    DOB: 01/25/1981, 43 y.o.   MRN: [id number]  HPI  Carolyn [name] is a 43 y.o. G40P1001 female who presents for follow-up of chronic anemia due to vaginal bleeding.  Carolyn [name] was started on Depo Provera  for contraception at [facility] 02/13/24.  She missed her next period and then started to experience heavy vaginal bleeding in July.  She went to the ED 04/11/24 for vaginal bleeding soaking 2 pads a day for 2 days.  Carolyn [name] was concerned because when she would go from lying down to standing she would have a large gush of bleeding. At that time her hgb was 7.1 (unclear in note what intervention was provided).   She returned to the ED 7/27 for a sore throat and her hgb was found to be 5.5.  She was given 2 units of blood and assessed by Lindle Canny CNM and Harlene Cisco CNM,  She was given txa and started on Aygestin  bid.  Pelvic ultrasound done that noted numerous fibroids up to 5.2cm.  {Common ambulatory SmartLinks:19316}  Review of Systems {ros; complete:30496}   Objective:   There were no vitals taken for this visit. There is no height or weight on file to calculate BMI.  General appearance: {general exam:16600} Abdomen: {abdominal exam:16834} Pelvic: {pelvic exam:16852::cervix normal in appearance,external genitalia normal,no adnexal masses or tenderness,no cervical motion tenderness,rectovaginal septum normal,uterus normal size, shape, and consistency,vagina normal without discharge} Extremities: {extremity exam:5109} Neurologic: {neuro exam:17854}  TVUS 04/28/24 FINDINGS: Uterus   Measurements: 11.2 x 9.7 x 9 cm = volume: 518 mL. Innumerable fibroids throughout the uterus, the largest measuring 5.2 cm.   Endometrium   Thickness: 6 mm in thickness.  No focal abnormality visualized.   Right ovary   Measurements: 3.6 x 2.5 x 2.0 cm = volume: 10 mL.  Normal appearance/no adnexal mass.   Left ovary   Measurements: Not visualized.  No adnexal mass seen.   Other findings:  No abnormal free fluid.   IMPRESSION: Enlarged fibroid uterus with numerous fibroids measuring up to 5.2 cm.  Assessment/Plan:   No diagnosis found.   There are no diagnoses linked to this encounter.     Estil Mangle, DO [facility]

## 2024-07-17 ENCOUNTER — Ambulatory Visit: Admitting: Obstetrics

## 2024-07-26 ENCOUNTER — Ambulatory Visit

## 2024-07-26 DIAGNOSIS — Z113 Encounter for screening for infections with a predominantly sexual mode of transmission: Secondary | ICD-10-CM | POA: Diagnosis not present

## 2024-07-26 LAB — HM HIV SCREENING LAB: HM HIV Screening: NEGATIVE

## 2024-07-26 LAB — WET PREP FOR TRICH, YEAST, CLUE
Clue Cell Exam: NEGATIVE
Trichomonas Exam: NEGATIVE
Yeast Exam: NEGATIVE

## 2024-07-26 NOTE — Progress Notes (Signed)
 Pt is here STD screening. Wet prep results reviewed with patient and requires no treatment per provider.Condoms declined. Wilkie Drought, RN.

## 2024-07-26 NOTE — Progress Notes (Signed)
 [facility] Department STI clinic [address] [place] [address] Main phone: [phone]  STI screening visit  Subjective:  Carolyn [name] is a 43 y.o. female being seen today for an STI screening visit. The patient reports they do have symptoms.  Patient reports that they do not desire a pregnancy in the next year. Patient is currently using oral contraceptive to prevent pregnancy. They reported they are not interested in discussing contraception today.    Patient's last menstrual period was 07/21/2024.  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 Does report her vision has been blurrier than usual over the last month (wears glasses). Discussed we cannot perform urine testing here at [facility], advised we can rule out vaginal infections but if dysuria persists she should seek urgent care/PCP evaluation.  See flowsheet for further details and programmatic requirements Hyperlink available at the top of the signed note in blue.  Flow sheet content below:  Pregnancy Intention Screening Does the patient want to become pregnant in the next year?: No Does the patient's partner want to become pregnant in the next  year?: No Would the patient like to discuss contraceptive options today?: No Reason For STD Screen STD Screening: Has symptoms Have you ever had an STD?: Yes History of Antibiotic use in the past 2 weeks?: No STD Symptoms Denies all: No Genital Itching: No Lower abdominal pain: No Discharge: No Dysuria: Yes Genital ulcer / lesion: No Rash: No Vaginal irritation: Yes Oral / Other skin ulcer: No Pain with sex: No Sore Throat: No Visual Changes: Yes Visual changes s/s: blurry vision since last month Vaginal Bleeding: Yes Risk Factors for Hep B Household, sexual, or needle sharing contact of a person infected with Hep B: No Sexual contact with a person who uses drugs not as prescribed?: No Currently or Ever used drugs not as prescribed: No HIV Positive: No PRep Patient: No Men who have sex with men: No Have Hepatitis C: No History of Incarceration: No History of Homeslessness?: No Anal sex following anal drug use?: No Risk Factors for Hep C Currently using drugs not as prescribed: No Sexual partner(s) currently using drugs as not prescribed: No History of drug use: No HIV Positive: No People with a history of incarceration: No People born between the years of 49 and 1965: No   Screening for MPX risk:  Unexplained rash?  No   MSM?  No   Multiple or anonymous sex partners?  No   Any close or sexual contact with a person  diagnosed with MPX?  No   Any outside the [place]  where MPX is endemic?  No   High clinical suspicion for MPX?    -Unlikely to be chickenpox    -  Lymphadenopathy    -Rash that presents in same phase of       evolution on any given body part  No   Screenings: Last HIV test per patient/review of record was  Lab Results  Component Value Date   HMHIVSCREEN Negative - Validated 02/13/2024    Lab Results  Component Value Date   HIV Non Reactive 11/22/2023     Last HEPC test per patient/review of record was  Lab Results  Component Value Date    HMHEPCSCREEN Negative-Validated 11/24/2020   No components found for: HEPC   Last HEPB test per patient/review of record was No components found for: HMHEPBSCREEN   Patient reports last pap was:   No results found for: SPECADGYN Result Date Procedure Results Follow-ups  02/18/2021 Cytology - PAP High risk HPV: Negative Adequacy: Satisfactory for evaluation; transformation zone component PRESENT. Diagnosis: - Negative for intraepithelial lesion or malignancy (NILM) Comment: Normal Reference Range HPV - Negative   02/18/2021 Surgical pathology SURGICAL PATHOLOGY: SURGICAL PATHOLOGY CASE: [id number] PATIENT: Carolyn [name] Surgical Pathology Report     Clinical History: Fibroid, menorrhagia with regular cycle (cm)     FINAL MICROSCOPIC DIAGNOSIS:  A. ENDOMETRIUM, BIOPSY: - Secretory endometrium...   03/20/2018 HM PAP SMEAR HM Pap smear: ASCUS, HPV negative     Immunization history:  Immunization History  Administered Date(s) Administered   Hepatitis B 07/17/1995, 08/16/1995   Influenza, Seasonal, Injecte, Preservative Fre 11/24/2023   Influenza,inj,Quad PF,6+ Mos 08/23/2022   Moderna SARS-COV2 Booster Vaccination 02/16/2021   Moderna Sars-Covid-2 Vaccination 05/11/2020   PPD Test 03/25/2022   Tdap 04/27/2015    The following portions of the patient's history were reviewed and updated as appropriate: allergies, current medications, past medical history, past social history, past surgical history and problem list.  Objective:  There were no vitals filed for this visit.  Physical Exam Vitals and nursing note reviewed. Exam conducted with a chaperone present Brett Orange).  Constitutional:      Appearance: Normal appearance.  HENT:     Head: Normocephalic and atraumatic.     Mouth/Throat:     Mouth: Mucous membranes are moist.     Pharynx: Oropharynx is clear. No oropharyngeal exudate or posterior oropharyngeal erythema.  Pulmonary:     Effort: Pulmonary  effort is normal.  Abdominal:     General: Abdomen is flat.     Palpations: There is no mass.     Tenderness: There is no abdominal tenderness. There is no rebound.  Genitourinary:    General: Normal vulva.     Exam position: Lithotomy position.     Pubic Area: No rash or pubic lice.      Labia:        Right: No rash or lesion.        Left: No rash or lesion.      Vagina: Normal. No vaginal discharge, erythema, bleeding or lesions.     Cervix: No cervical motion tenderness, discharge, friability, lesion or erythema.     Uterus: Normal.      Adnexa: Right adnexa normal and left adnexa normal.     Rectum: Normal.  Lymphadenopathy:     Head:     Right side of head: No preauricular or posterior auricular adenopathy.     Left side of head: No preauricular or posterior auricular adenopathy.     Cervical: No cervical adenopathy.     Upper Body:     Right upper body: No supraclavicular, axillary or epitrochlear adenopathy.  Left upper body: No supraclavicular, axillary or epitrochlear adenopathy.     Lower Body: No right inguinal adenopathy. No left inguinal adenopathy.  Skin:    General: Skin is warm and dry.     Findings: No rash.  Neurological:     Mental Status: She is alert and oriented to person, place, and time.    Assessment and Plan:  Carolyn [name] is a 43 y.o. female presenting to the [facility] Department for STI screening  1. Screening for venereal disease (Primary)  - Chlamydia/Gonorrhea [place] [facility] - WET PREP FOR TRICH, YEAST, CLUE - HIV [place] [facility] - Syphilis Serology, [place] [facility] - Gonococcus culture   Patient accepted the following screenings: oral GC culture, vaginal CT/GC swab, vaginal wet prep, HIV, and RPR Patient meets criteria for HepB screening? No. Ordered? no Patient meets criteria for HepC screening? No. Ordered? no  Treat wet prep per standing order Discussed time line for [facility] results and that patient will be  called with positive results and encouraged patient to call if she had not heard in 2 weeks.  Counseled to return or seek care for continued or worsening symptoms Recommended repeat testing in 3 months with positive results. Recommended condom use with all sex for STI prevention.   Return if symptoms worsen or fail to improve.  No future appointments.  Damien FORBES Satchel, NP

## 2024-07-30 LAB — GONOCOCCUS CULTURE

## 2024-08-01 ENCOUNTER — Telehealth: Payer: Self-pay | Admitting: Family Medicine

## 2024-08-02 NOTE — Telephone Encounter (Signed)
 Call to client who desires STI testing results from appt on 07/26/24. Client's DOB verified as well as password [phone]). Counseled throat gonorrhea culture was negative and remaining test results still pending at the [facility]. Per client, will call back in 1 week to check on result availability. Burnadette Lowers, RN

## 2024-08-09 ENCOUNTER — Encounter: Payer: Self-pay | Admitting: Oncology

## 2024-08-09 ENCOUNTER — Encounter: Payer: Self-pay | Admitting: Emergency Medicine

## 2024-08-09 ENCOUNTER — Emergency Department
Admission: EM | Admit: 2024-08-09 | Discharge: 2024-08-09 | Discharge status: Against Medical Advice | Source: Ambulatory Visit | Attending: Emergency Medicine | Admitting: Emergency Medicine

## 2024-08-09 ENCOUNTER — Ambulatory Visit: Admission: EM | Admit: 2024-08-09 | Discharge: 2024-08-09 | Disposition: A | Discharge status: Home / Self Care

## 2024-08-09 ENCOUNTER — Other Ambulatory Visit: Payer: Self-pay

## 2024-08-09 DIAGNOSIS — R531 Weakness: Secondary | ICD-10-CM | POA: Diagnosis not present

## 2024-08-09 DIAGNOSIS — R10A2 Flank pain, left side: Secondary | ICD-10-CM | POA: Diagnosis present

## 2024-08-09 DIAGNOSIS — Z5321 Procedure and treatment not carried out due to patient leaving prior to being seen by health care provider: Secondary | ICD-10-CM | POA: Insufficient documentation

## 2024-08-09 DIAGNOSIS — R5383 Other fatigue: Secondary | ICD-10-CM | POA: Insufficient documentation

## 2024-08-09 HISTORY — DX: Encounter for other specified aftercare: Z51.89

## 2024-08-09 LAB — COMPREHENSIVE METABOLIC PANEL WITH GFR
ALT: 11 U/L (ref 0–44)
AST: 16 U/L (ref 15–41)
Albumin: 3.7 g/dL (ref 3.5–5.0)
Alkaline Phosphatase: 49 U/L (ref 38–126)
Anion gap: 11 (ref 5–15)
BUN: 13 mg/dL (ref 6–20)
CO2: 24 mmol/L (ref 22–32)
Calcium: 8.7 mg/dL — ABNORMAL LOW (ref 8.9–10.3)
Chloride: 106 mmol/L (ref 98–111)
Creatinine, Ser: 0.71 mg/dL (ref 0.44–1.00)
GFR, Estimated: >60 mL/min (ref >60)
Glucose, Bld: 93 mg/dL (ref 70–99)
Potassium: 3.1 mmol/L — ABNORMAL LOW (ref 3.5–5.1)
Sodium: 141 mmol/L (ref 135–145)
Total Bilirubin: 0.6 mg/dL (ref 0.0–1.2)
Total Protein: 7.8 g/dL (ref 6.5–8.1)

## 2024-08-09 LAB — URINALYSIS, ROUTINE W REFLEX MICROSCOPIC
Bilirubin Urine: NEGATIVE
Glucose, UA: NEGATIVE mg/dL
Ketones, ur: NEGATIVE mg/dL
Nitrite: NEGATIVE
Protein, ur: NEGATIVE mg/dL
Specific Gravity, Urine: 1.029 (ref 1.005–1.030)
Squamous Epithelial / HPF: >50 /HPF (ref 0–5)
pH: 5 (ref 5.0–8.0)

## 2024-08-09 LAB — TYPE AND SCREEN
ABO/RH(D): A POS
Antibody Screen: NEGATIVE

## 2024-08-09 LAB — CBC
HCT: 30.7 % — ABNORMAL LOW (ref 36.0–46.0)
Hemoglobin: 9.1 g/dL — ABNORMAL LOW (ref 12.0–15.0)
MCH: 21.6 pg — ABNORMAL LOW (ref 26.0–34.0)
MCHC: 29.6 g/dL — ABNORMAL LOW (ref 30.0–36.0)
MCV: 72.9 fL — ABNORMAL LOW (ref 80.0–100.0)
Platelets: 253 K/uL (ref 150–400)
RBC: 4.21 MIL/uL (ref 3.87–5.11)
RDW: 18.1 % — ABNORMAL HIGH (ref 11.5–15.5)
WBC: 8.1 K/uL (ref 4.0–10.5)
nRBC: 0 % (ref 0.0–0.2)

## 2024-08-09 LAB — POC URINE PREG, ED: Preg Test, Ur: NEGATIVE

## 2024-08-09 NOTE — Discharge Instructions (Signed)
 Go to the Er for further evaluation of weakness

## 2024-08-09 NOTE — ED Notes (Signed)
 Patient is being discharged from the Urgent Care and sent to the Emergency Department via private vehicle with family . Per Rilla Flood, NP, patient is in need of higher level of care due to history of fatigue and possibly needing blood transfusion. Patient is aware and verbalizes understanding of plan of care.  Vitals:   08/09/24 1213  BP: 127/77  Pulse: 78  Resp: 14  Temp: 99.1 F (37.3 C)  SpO2: 98%

## 2024-08-09 NOTE — ED Triage Notes (Signed)
 Pt to ED for fatigue since 3 weeks. States hx sickle trait and needing transfusions for anemia. Last blood transfusion was July.  When asked about pain, pt also states having sharp intermittent L flank pain that does not radiate. Nailbeds are pale. Pt is ambulatory. Skin is dry with unlabored respirations.

## 2024-08-09 NOTE — ED Notes (Signed)
 Attempted to call pt's phone number in chart but was unsuccessful

## 2024-08-09 NOTE — ED Provider Notes (Signed)
 [facility]    CSN: [id number] Arrival date & time: 08/09/24  1154      History   Chief Complaint Chief Complaint  Patient presents with   Weakness    HPI Carolyn [name] is a 43 y.o. female.   43 year old female, Carolyn [name], presents to urgent care for evaluation of weakness and fatigue for 3 weeks.  Patient states she has a history of anemia and her last blood transfusion was in July.  Patient also reports intermittent fever. Pt is eating well,voiding well, no bleeding per pt report. Pt has not followed up since 07/25 transfusion. No known illness exposure.  The history is provided by the patient. No language interpreter was used.    Past Medical History:  Diagnosis Date   Anemia    Fibroids    GERD (gastroesophageal reflux disease)    Sickle cell trait    Ulcer of abdomen wall     Patient Active Problem List   Diagnosis Date Noted   Iron  deficiency anemia secondary to blood loss (chronic) 04/28/2024   Ulcer of esophagus with bleeding 11/22/2023   Melena 11/21/2023   Right-sided chest pain 11/21/2023   Hypokalemia 08/21/2022   Fibroid 02/18/2021   History of gastric ulcer 02/12/2021   History of GI bleed 02/12/2021   Endometrial stripe increased 02/12/2021   Intramural leiomyoma of uterus    Menorrhagia with irregular cycle 03/20/2018   Iron  deficiency anemia due to chronic blood loss    Duodenal ulceration    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    Past Surgical History:  Procedure Laterality Date   BIOPSY  11/22/2023   Procedure: BIOPSY;  Surgeon: Jinny Carmine, MD;  Location: [facility] ENDOSCOPY;  Service: Endoscopy;;   ESOPHAGOGASTRODUODENOSCOPY N/A 11/22/2023   Procedure: ESOPHAGOGASTRODUODENOSCOPY (EGD);  Surgeon: Jinny Carmine, MD;  Location: [facility] ENDOSCOPY;  Service: Endoscopy;  Laterality: N/A;   ESOPHAGOGASTRODUODENOSCOPY (EGD) WITH PROPOFOL  N/A 02/26/2016   Procedure: ESOPHAGOGASTRODUODENOSCOPY (EGD) WITH PROPOFOL ;  Surgeon: Carmine Jinny, MD;   Location: [facility] ENDOSCOPY;  Service: Endoscopy;  Laterality: N/A;   ESOPHAGOGASTRODUODENOSCOPY (EGD) WITH PROPOFOL  N/A 08/22/2022   Procedure: ESOPHAGOGASTRODUODENOSCOPY (EGD) WITH PROPOFOL ;  Surgeon: Toledo, Ladell POUR, MD;  Location: [facility] ENDOSCOPY;  Service: Gastroenterology;  Laterality: N/A;   HEMOSTASIS CLIP PLACEMENT  11/22/2023   Procedure: HEMOSTASIS CLIP PLACEMENT;  Surgeon: Jinny Carmine, MD;  Location: [facility] ENDOSCOPY;  Service: Endoscopy;;   WISDOM TOOTH EXTRACTION Bilateral     OB History     Gravida  1   Para  1   Term  1   Preterm      AB  0   Living  1      SAB      IAB      Ectopic      Multiple      Live Births               Home Medications    Prior to Admission medications   Medication Sig Start Date End Date Taking? Authorizing Provider  norethindrone  (AYGESTIN ) 5 MG tablet Take 1 tablet (5 mg total) by mouth 2 (two) times daily. As directed 04/29/24   Jayne Harlene CROME, CNM  pantoprazole  (PROTONIX ) 40 MG tablet Take 1 tablet (40 mg total) by mouth 2 (two) times daily. 11/24/23 07/26/24  Trudy Anthony HERO, MD    Family History Family History  Problem Relation Age of Onset   Diabetes Mother    Hypertension Mother    Lung cancer Father  Leukemia Father    Lung cancer Maternal Grandmother    Colon cancer Maternal Grandfather    Cancer Paternal Grandmother    Cancer Paternal Grandfather     Social History Social History   Tobacco Use   Smoking status: Every Day    Current packs/day: 0.00    Average packs/day: 2.0 packs/day for 26.0 years (52.0 ttl pk-yrs)    Types: Cigarettes    Start date: 08/24/1996    Last attempt to quit: 08/24/2022    Years since quitting: 1.9   Smokeless tobacco: Never  Vaping Use   Vaping status: Never Used  Substance Use Topics   Alcohol use: Not Currently    Comment: last ETOH 6mos ago   Drug use: Not Currently    Types: Marijuana     Allergies   Patient has no known allergies.   Review  of Systems Review of Systems  Constitutional:  Positive for fatigue and fever.  Neurological:  Positive for weakness.  All other systems reviewed and are negative.    Physical Exam Triage Vital Signs ED Triage Vitals [08/09/24 1211]  Encounter Vitals Group     BP      Girls Systolic BP Percentile      Girls Diastolic BP Percentile      Boys Systolic BP Percentile      Boys Diastolic BP Percentile      Pulse      Resp      Temp      Temp src      SpO2      Weight 180 lb (81.6 kg)     Height 5' 8 (1.727 m)     Head Circumference      Peak Flow      Pain Score 8     Pain Loc      Pain Education      Exclude from Growth Chart    No data found.  Updated Vital Signs BP 127/77 (BP Location: Right Arm)   Pulse 78   Temp 99.1 F (37.3 C) (Oral)   Resp 14   Ht 5' 8 (1.727 m)   Wt 180 lb (81.6 kg)   LMP 07/21/2024   SpO2 98%   BMI 27.37 kg/m   Visual Acuity Right Eye Distance:   Left Eye Distance:   Bilateral Distance:    Right Eye Near:   Left Eye Near:    Bilateral Near:     Physical Exam Vitals and nursing note reviewed.  Constitutional:      General: She is not in acute distress.    Appearance: She is well-developed.  HENT:     Head: Normocephalic and atraumatic.  Eyes:     General: Vision grossly intact.     Conjunctiva/sclera: Conjunctivae normal.     Pupils: Pupils are equal, round, and reactive to light.     Comments: Conjunctival pale  Cardiovascular:     Rate and Rhythm: Normal rate and regular rhythm.     Pulses: Normal pulses.     Heart sounds: Normal heart sounds. No murmur heard.    Comments: Nail beds pale Pulmonary:     Effort: Pulmonary effort is normal. No respiratory distress.     Breath sounds: Normal breath sounds.  Abdominal:     Palpations: Abdomen is soft.     Tenderness: There is no abdominal tenderness.  Musculoskeletal:        General: No swelling.     Cervical back: Neck supple.  Skin:    General: Skin is warm and  dry.     Capillary Refill: Capillary refill takes less than 2 seconds.  Neurological:     General: No focal deficit present.     Mental Status: She is alert and oriented to person, place, and time.     GCS: GCS eye subscore is 4. GCS verbal subscore is 5. GCS motor subscore is 6.  Psychiatric:        Attention and Perception: Attention normal.        Mood and Affect: Mood normal.        Speech: Speech normal.        Behavior: Behavior normal.      UC Treatments / Results  Labs (all labs ordered are listed, but only abnormal results are displayed) Labs Reviewed - No data to display  EKG   Radiology No results found.  Procedures Procedures (including critical care time)  Medications Ordered in UC Medications - No data to display  Initial Impression / Assessment and Plan / UC Course  I have reviewed the triage vital signs and the nursing notes.  Pertinent labs & imaging results that were available during my care of the patient were reviewed by me and considered in my medical decision making (see chart for details).    Discussed exam findings and plan of care with patient,recommend evaluation in ER, no labs available at this facility given pt hx of anemia and blood transfuison requirements.  Patient verbalized understanding to this provider.  Ddx: Weakness, anemia, fatigue,cardiac issues, viral illness Final Clinical Impressions(s) / UC Diagnoses   Final diagnoses:  Weakness     Discharge Instructions      Go to the Er for further evaluation of weakness     ED Prescriptions   None    PDMP not reviewed this encounter.   Aminta Loose, NP 08/09/24 1316

## 2024-08-09 NOTE — ED Triage Notes (Signed)
 Patient reports weakness and fatigue for the past 3 weeks.  Patient states that her last blood transfusion was in July.  Patient reports some fevers off and on.

## 2024-08-14 ENCOUNTER — Ambulatory Visit: Admitting: Licensed Clinical Social Worker
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
## 2024-08-15 ENCOUNTER — Telehealth: Payer: Self-pay | Admitting: Family Medicine

## 2024-08-15 NOTE — Telephone Encounter (Signed)
 Pt has been called concerning lab results

## 2024-08-15 NOTE — Telephone Encounter (Signed)
 Pt has been called back concerning Lab results

## 2024-08-21 ENCOUNTER — Other Ambulatory Visit: Payer: Self-pay

## 2024-08-21 DIAGNOSIS — R531 Weakness: Secondary | ICD-10-CM | POA: Diagnosis not present

## 2024-08-21 DIAGNOSIS — D649 Anemia, unspecified: Secondary | ICD-10-CM | POA: Insufficient documentation

## 2024-08-21 LAB — COMPREHENSIVE METABOLIC PANEL WITH GFR
ALT: 12 U/L (ref 0–44)
AST: 18 U/L (ref 15–41)
Albumin: 4.2 g/dL (ref 3.5–5.0)
Alkaline Phosphatase: 54 U/L (ref 38–126)
Anion gap: 8 (ref 5–15)
BUN: 8 mg/dL (ref 6–20)
CO2: 26 mmol/L (ref 22–32)
Calcium: 8.9 mg/dL (ref 8.9–10.3)
Chloride: 106 mmol/L (ref 98–111)
Creatinine, Ser: 0.65 mg/dL (ref 0.44–1.00)
GFR, Estimated: >60 mL/min (ref >60)
Glucose, Bld: 92 mg/dL (ref 70–99)
Potassium: 3.6 mmol/L (ref 3.5–5.1)
Sodium: 140 mmol/L (ref 135–145)
Total Bilirubin: 0.3 mg/dL (ref 0.0–1.2)
Total Protein: 7.1 g/dL (ref 6.5–8.1)

## 2024-08-21 LAB — CBC
HCT: 25.9 % — ABNORMAL LOW (ref 36.0–46.0)
Hemoglobin: 7.7 g/dL — ABNORMAL LOW (ref 12.0–15.0)
MCH: 21.3 pg — ABNORMAL LOW (ref 26.0–34.0)
MCHC: 29.7 g/dL — ABNORMAL LOW (ref 30.0–36.0)
MCV: 71.5 fL — ABNORMAL LOW (ref 80.0–100.0)
Platelets: 373 K/uL (ref 150–400)
RBC: 3.62 MIL/uL — ABNORMAL LOW (ref 3.87–5.11)
RDW: 18.1 % — ABNORMAL HIGH (ref 11.5–15.5)
WBC: 9.3 K/uL (ref 4.0–10.5)
nRBC: 0 % (ref 0.0–0.2)

## 2024-08-21 NOTE — ED Triage Notes (Signed)
 Pt sts that she has been very fatigued and not feeling well. Pt was here 13 days ago and left prior to being seen due to she was hungry. Pt sts that she needs a blood transfusion as she is not able to do daily activities. Pt has normal skin color and pallor.

## 2024-08-22 ENCOUNTER — Telehealth: Payer: Self-pay | Admitting: Oncology

## 2024-08-22 ENCOUNTER — Other Ambulatory Visit: Payer: Self-pay

## 2024-08-22 ENCOUNTER — Emergency Department
Admission: EM | Admit: 2024-08-22 | Discharge: 2024-08-22 | Disposition: A | Discharge status: Home / Self Care | Attending: Emergency Medicine | Admitting: Emergency Medicine

## 2024-08-22 DIAGNOSIS — D500 Iron deficiency anemia secondary to blood loss (chronic): Secondary | ICD-10-CM

## 2024-08-22 DIAGNOSIS — D649 Anemia, unspecified: Secondary | ICD-10-CM

## 2024-08-22 LAB — PREPARE RBC (CROSSMATCH)

## 2024-08-22 LAB — RETICULOCYTES
Immature Retic Fract: 21.8 % — ABNORMAL HIGH (ref 2.3–15.9)
RBC.: 3.31 MIL/uL — ABNORMAL LOW (ref 3.87–5.11)
Retic Count, Absolute: 24.5 K/uL (ref 19.0–186.0)
Retic Ct Pct: 0.7 % (ref 0.4–3.1)

## 2024-08-22 LAB — IRON AND TIBC
Iron: 14 ug/dL — ABNORMAL LOW (ref 28–170)
Saturation Ratios: 3 % — ABNORMAL LOW (ref 10.4–31.8)
TIBC: 437 ug/dL (ref 250–450)
UIBC: 423 ug/dL

## 2024-08-22 LAB — FOLATE: Folate: 12.7 ng/mL (ref >5.9)

## 2024-08-22 LAB — FERRITIN: Ferritin: 5 ng/mL — ABNORMAL LOW (ref 11–307)

## 2024-08-22 LAB — VITAMIN B12: Vitamin B-12: 247 pg/mL (ref 180–914)

## 2024-08-22 MED ORDER — FERROUS SULFATE 325 (65 FE) MG PO TBEC: 325.0000 mg | DELAYED_RELEASE_TABLET | Freq: Every day | ORAL | 3 refills | Status: AC

## 2024-08-22 MED ORDER — SODIUM CHLORIDE 0.9 % IV SOLN: 10.0000 mL/h | Freq: Once | INTRAVENOUS | Status: DC

## 2024-08-22 NOTE — Telephone Encounter (Signed)
 Have him see lindsey week after thanksgiving and he can get iv iron  on that day. Chceck cbc on that day

## 2024-08-22 NOTE — Telephone Encounter (Signed)
 Pt calling and states she went to the ER last night and was advised to call Dr. Melanee office to set up iron  infusions. Please advise

## 2024-08-22 NOTE — ED Provider Notes (Signed)
 [facility] Provider Note    Event Date/Time   First MD Initiated Contact with Patient 08/22/24 0015     (approximate)   History   Fatigue   HPI  Carolyn [name] is a 43 y.o. female with history of uterine fibroids who presents to the emergency department with complaints of fatigue and generalized weakness.  History of anemia and receives prior blood transfusions and iron  transfusions.  States she has had endoscopies and colonoscopies that have been unremarkable and denies bloody stools or melena.  She still has regular menstrual cycles.  Last was a week ago.  Last for about 6 days and she does pass clots but states she is now on medications to slow her bleeding.  She does not recall the name of this medication but it looks like she is on Aygestin  twice daily.  States she thinks that she needs another blood transfusion and needs referral for an iron  transfusion at the cancer center.  Does not have a PCP.  Does have a GYN.  She is not on blood thinners.   History provided by patient, family.    Past Medical History:  Diagnosis Date   Anemia    Blood transfusion without reported diagnosis    Fibroids    GERD (gastroesophageal reflux disease)    Sickle cell trait    Ulcer of abdomen wall     Past Surgical History:  Procedure Laterality Date   BIOPSY  11/22/2023   Procedure: BIOPSY;  Surgeon: Jinny Carmine, MD;  Location: [facility] ENDOSCOPY;  Service: Endoscopy;;   ESOPHAGOGASTRODUODENOSCOPY N/A 11/22/2023   Procedure: ESOPHAGOGASTRODUODENOSCOPY (EGD);  Surgeon: Jinny Carmine, MD;  [facility];  Service: Endoscopy;  Laterality: N/A;   ESOPHAGOGASTRODUODENOSCOPY (EGD) WITH PROPOFOL  N/A 02/26/2016   Procedure: ESOPHAGOGASTRODUODENOSCOPY (EGD) WITH PROPOFOL ;  Surgeon: Carmine Jinny, MD;  Location: [facility] ENDOSCOPY;  Service: Endoscopy;  Laterality: N/A;   ESOPHAGOGASTRODUODENOSCOPY (EGD) WITH PROPOFOL  N/A 08/22/2022   Procedure:  ESOPHAGOGASTRODUODENOSCOPY (EGD) WITH PROPOFOL ;  Surgeon: Toledo, Ladell POUR, MD;  Location: [facility] ENDOSCOPY;  Service: Gastroenterology;  Laterality: N/A;   HEMOSTASIS CLIP PLACEMENT  11/22/2023   Procedure: HEMOSTASIS CLIP PLACEMENT;  Surgeon: Jinny Carmine, MD;  Location: [facility] ENDOSCOPY;  Service: Endoscopy;;   WISDOM TOOTH EXTRACTION Bilateral     MEDICATIONS:  Prior to Admission medications   Medication Sig Start Date End Date Taking? Authorizing Provider  norethindrone  (AYGESTIN ) 5 MG tablet Take 1 tablet (5 mg total) by mouth 2 (two) times daily. As directed 04/29/24   Jayne Harlene CROME, CNM  pantoprazole  (PROTONIX ) 40 MG tablet Take 1 tablet (40 mg total) by mouth 2 (two) times daily. 11/24/23 07/26/24  Trudy Anthony HERO, MD    Physical Exam   Triage Vital Signs: ED Triage Vitals  Encounter Vitals Group     BP 08/21/24 1710 (!) 140/103     Girls Systolic BP Percentile --      Girls Diastolic BP Percentile --      Boys Systolic BP Percentile --      Boys Diastolic BP Percentile --      Pulse Rate 08/21/24 1710 81     Resp 08/21/24 1710 19     Temp 08/21/24 1710 98.5 F (36.9 C)     Temp Source 08/21/24 1710 Oral     SpO2 08/21/24 1710 100 %     Weight 08/21/24 1711 180 lb (81.6 kg)     Height 08/21/24 1711 5' 8 (1.727 m)     Head  Circumference --      Peak Flow --      Pain Score 08/21/24 1711 (S) 10     Pain Loc --      Pain Education --      Exclude from Growth Chart --     Most recent vital signs: Vitals:   08/22/24 0058 08/22/24 0059  BP:  (!) 133/93  Pulse: 77 77  Resp:  17  Temp:  98.2 F (36.8 C)  SpO2: 100% 100%    CONSTITUTIONAL: Alert, responds appropriately to questions. Well-appearing; well-nourished HEAD: Normocephalic, atraumatic EYES: Conjunctivae clear, pupils appear equal, sclera nonicteric ENT: normal nose; moist mucous membranes NECK: Supple, normal ROM CARD: RRR; S1 and S2 appreciated RESP: Normal chest excursion without splinting or  tachypnea; breath sounds clear and equal bilaterally; no wheezes, no rhonchi, no rales, no hypoxia or respiratory distress, speaking full sentences ABD/GI: Non-distended; soft, non-tender, no rebound, no guarding, no peritoneal signs BACK: The back appears normal EXT: Normal ROM in all joints; no deformity noted, no edema SKIN: Normal color for age and race; warm; no rash on exposed skin NEURO: Moves all extremities equally, normal speech PSYCH: The patient's mood and manner are appropriate.   ED Results / Procedures / Treatments   LABS: (all labs ordered are listed, but only abnormal results are displayed) Labs Reviewed  CBC - Abnormal; Notable for the following components:      Result Value   RBC 3.62 (*)    Hemoglobin 7.7 (*)    HCT 25.9 (*)    MCV 71.5 (*)    MCH 21.3 (*)    MCHC 29.7 (*)    RDW 18.1 (*)    All other components within normal limits  COMPREHENSIVE METABOLIC PANEL WITH GFR  VITAMIN B12  FOLATE  IRON  AND TIBC  FERRITIN  RETICULOCYTES  TYPE AND SCREEN  PREPARE RBC (CROSSMATCH)     EKG:     RADIOLOGY: My personal review and interpretation of imaging:    I have personally reviewed all radiology reports.   No results found.   PROCEDURES:  Critical Care performed: Yes, see critical care procedure note(s)   CRITICAL CARE Performed by: Josette Sink   Total critical care time: 30 minutes  Critical care time was exclusive of separately billable procedures and treating other patients.  Critical care was necessary to treat or prevent imminent or life-threatening deterioration.  Critical care was time spent personally by me on the following activities: development of treatment plan with patient and/or surrogate as well as nursing, discussions with consultants, evaluation of patient's response to treatment, examination of patient, obtaining history from patient or surrogate, ordering and performing treatments and interventions, ordering and review of  laboratory studies, ordering and review of radiographic studies, pulse oximetry and re-evaluation of patient's condition.   SABRA1-3 Lead EKG Interpretation  Performed by: Delfino Friesen, Josette SAILOR, DO Authorized by: Colonel Krauser, Josette SAILOR, DO     Interpretation: normal     ECG rate:  77   ECG rate assessment: normal     Rhythm: sinus rhythm     Ectopy: none     Conduction: normal       IMPRESSION / MDM / ASSESSMENT AND PLAN / ED COURSE  I reviewed the triage vital signs and the nursing notes.    Patient here with symptomatic anemia.  History of the same.  Being followed by GYN and is on Aygestin  twice daily.  The patient is on the cardiac monitor to evaluate  for evidence of arrhythmia and/or significant heart rate changes.   DIFFERENTIAL DIAGNOSIS (includes but not limited to):   Symptomatic anemia, no signs of shock, no active bleeding, likely GYN in nature, doubt GI bleed   Patient's presentation is most consistent with acute presentation with potential threat to life or bodily function.   PLAN: Patient's hemoglobin here is 7.7.  Given she is quite symptomatic from this, will transfuse 1 unit of blood.  She is not actively bleeding.  She has a GYN for follow-up.  Will place PCP referral.  Discussed with her that she will need to follow-up with her outpatient doctors for referral for iron  transfusion.  Repeat iron  level, anemia panel pending which can be followed up as an outpatient.  We did discuss the possibility of admission to the hospital for monitoring but states that she would like to receive blood and go home which I feel is reasonable given she has outpatient follow-up and has a documented history of anemia secondary to her menstrual cycles.  She has not on iron  supplementation at home.  Will discharge with prescription of ferrous sulfate .   MEDICATIONS GIVEN IN ED: Medications  0.9 %  sodium chloride  infusion (has no administration in time range)     ED COURSE:  At this time, I do  not feel there is any life-threatening condition present. I reviewed all nursing notes, vitals, pertinent previous records.  All lab and urine results, EKGs, imaging ordered have been independently reviewed and interpreted by myself.  I reviewed all available radiology reports from any imaging ordered this visit.  Based on my assessment, I feel the patient is safe to be discharged home without further emergent workup and can continue workup as an outpatient as needed. Discussed all findings, treatment plan as well as usual and customary return precautions.  They verbalize understanding and are comfortable with this plan.  Outpatient follow-up has been provided as needed.  All questions have been answered.    CONSULTS: Admission considered but patient would prefer discharge home after blood transfusion in the ED.   OUTSIDE RECORDS REVIEWED: Reviewed previous GYN notes and admissions.       FINAL CLINICAL IMPRESSION(S) / ED DIAGNOSES   Final diagnoses:  Symptomatic anemia     Rx / DC Orders   ED Discharge Orders          Ordered    Ambulatory Referral to Primary Care (Establish Care)        08/22/24 0036    ferrous sulfate  325 (65 FE) MG EC tablet  Daily with breakfast        08/22/24 0055             Note:  This document was prepared using Dragon voice recognition software and may include unintentional dictation errors.   Dustyn Armbrister, Josette SAILOR, DO 08/22/24 0127

## 2024-08-23 LAB — TYPE AND SCREEN
ABO/RH(D): A POS
Antibody Screen: NEGATIVE
Unit division: 0

## 2024-08-23 LAB — BPAM RBC
Blood Product Expiration Date: 202512192359
ISSUE DATE / TIME: 202511200113
Unit Type and Rh: 6200

## 2024-08-23 NOTE — Telephone Encounter (Signed)
 Pt called again in regards to iron  infusions, I scheduled appts per the MD response from this telephone encounter thread. Appts confirmed with pt

## 2024-08-26 NOTE — ED Notes (Signed)
 I have discussed the patient with the resident physician, and worked with them to arrive at a plan of care for this patient.  I have reviewed the resident's note and agree with the plan of care.  ED Triage Vitals  Enc Vitals Group     BP 08/24/24 0918 135/93     Pulse 08/24/24 0914 80     SpO2 Pulse --      Resp 08/24/24 0914 14     Temp 08/24/24 0918 36.9 C (98.4 F)     Temp Source 08/24/24 1115 Oral     SpO2 08/24/24 0914 100 %     Weight 08/24/24 0918 77.1 kg (170 lb)     Height --      Head Circumference --      Peak Flow --      Pain Score --      Pain Loc --      Pain Education --      Exclude from Growth Chart --

## 2024-09-03 ENCOUNTER — Other Ambulatory Visit: Payer: Self-pay | Admitting: *Deleted

## 2024-09-03 DIAGNOSIS — D500 Iron deficiency anemia secondary to blood loss (chronic): Secondary | ICD-10-CM

## 2024-09-05 ENCOUNTER — Inpatient Hospital Stay

## 2024-09-05 ENCOUNTER — Inpatient Hospital Stay: Admitting: Nurse Practitioner

## 2024-09-17 ENCOUNTER — Inpatient Hospital Stay (HOSPITAL_BASED_OUTPATIENT_CLINIC_OR_DEPARTMENT_OTHER): Admitting: Nurse Practitioner

## 2024-09-17 ENCOUNTER — Inpatient Hospital Stay

## 2024-09-17 ENCOUNTER — Inpatient Hospital Stay: Attending: Oncology

## 2024-09-17 ENCOUNTER — Encounter: Payer: Self-pay | Admitting: Nurse Practitioner

## 2024-09-17 VITALS — BP 132/84 | HR 100 | Temp 98.3°F | Resp 16 | Wt 163.0 lb

## 2024-09-17 VITALS — BP 113/81 | HR 88

## 2024-09-17 DIAGNOSIS — D509 Iron deficiency anemia, unspecified: Secondary | ICD-10-CM | POA: Insufficient documentation

## 2024-09-17 DIAGNOSIS — D500 Iron deficiency anemia secondary to blood loss (chronic): Secondary | ICD-10-CM

## 2024-09-17 LAB — SAMPLE TO BLOOD BANK

## 2024-09-17 LAB — CBC (CANCER CENTER ONLY)
HCT: 26.2 % — ABNORMAL LOW (ref 36.0–46.0)
Hemoglobin: 7.8 g/dL — ABNORMAL LOW (ref 12.0–15.0)
MCH: 21.5 pg — ABNORMAL LOW (ref 26.0–34.0)
MCHC: 29.8 g/dL — ABNORMAL LOW (ref 30.0–36.0)
MCV: 72.2 fL — ABNORMAL LOW (ref 80.0–100.0)
Platelet Count: 275 K/uL (ref 150–400)
RBC: 3.63 MIL/uL — ABNORMAL LOW (ref 3.87–5.11)
RDW: 19.4 % — ABNORMAL HIGH (ref 11.5–15.5)
WBC Count: 7.6 K/uL (ref 4.0–10.5)
nRBC: 0 % (ref 0.0–0.2)

## 2024-09-17 MED ORDER — METHYLPREDNISOLONE SODIUM SUCC 125 MG IJ SOLR
40.0000 mg | Freq: Every day | INTRAMUSCULAR | Status: DC
  Filled 2024-09-17: qty 2

## 2024-09-17 MED ORDER — IRON SUCROSE 20 MG/ML IV SOLN
200.0000 mg | INTRAVENOUS | Status: DC
  Administered 2024-09-17: 15:00:00 200 mg via INTRAVENOUS
  Filled 2024-09-17: qty 10

## 2024-09-17 NOTE — Progress Notes (Signed)
 Patient here for follow up with iron ; patient denies any new concerns at this time

## 2024-09-17 NOTE — Patient Instructions (Signed)
 Iron Sucrose Injection What is this medication? IRON SUCROSE (EYE ern SOO krose) treats low levels of iron (iron deficiency anemia) in people with kidney disease. Iron is a mineral that plays an important role in making red blood cells, which carry oxygen from your lungs to the rest of your body. This medicine may be used for other purposes; ask your health care provider or pharmacist if you have questions. COMMON BRAND NAME(S): Venofer What should I tell my care team before I take this medication? They need to know if you have any of these conditions: Anemia not caused by low iron levels Heart disease High levels of iron in the blood Kidney disease Liver disease An unusual or allergic reaction to iron, other medications, foods, dyes, or preservatives Pregnant or trying to get pregnant Breastfeeding How should I use this medication? This medication is for infusion into a vein. It is given in a hospital or clinic setting. Talk to your care team about the use of this medication in children. While this medication may be prescribed for children as young as 2 years for selected conditions, precautions do apply. Overdosage: If you think you have taken too much of this medicine contact a poison control center or emergency room at once. NOTE: This medicine is only for you. Do not share this medicine with others. What if I miss a dose? Keep appointments for follow-up doses. It is important not to miss your dose. Call your care team if you are unable to keep an appointment. What may interact with this medication? Do not take this medication with any of the following: Deferoxamine Dimercaprol Other iron products This medication may also interact with the following: Chloramphenicol Deferasirox This list may not describe all possible interactions. Give your health care provider a list of all the medicines, herbs, non-prescription drugs, or dietary supplements you use. Also tell them if you smoke,  drink alcohol, or use illegal drugs. Some items may interact with your medicine. What should I watch for while using this medication? Visit your care team regularly. Tell your care team if your symptoms do not start to get better or if they get worse. You may need blood work done while you are taking this medication. You may need to follow a special diet. Talk to your care team. Foods that contain iron include: whole grains/cereals, dried fruits, beans, or peas, leafy green vegetables, and organ meats (liver, kidney). What side effects may I notice from receiving this medication? Side effects that you should report to your care team as soon as possible: Allergic reactions--skin rash, itching, hives, swelling of the face, lips, tongue, or throat Low blood pressure--dizziness, feeling faint or lightheaded, blurry vision Shortness of breath Side effects that usually do not require medical attention (report to your care team if they continue or are bothersome): Flushing Headache Joint pain Muscle pain Nausea Pain, redness, or irritation at injection site This list may not describe all possible side effects. Call your doctor for medical advice about side effects. You may report side effects to FDA at 1-800-FDA-1088. Where should I keep my medication? This medication is given in a hospital or clinic. It will not be stored at home. NOTE: This sheet is a summary. It may not cover all possible information. If you have questions about this medicine, talk to your doctor, pharmacist, or health care provider.  2024 Elsevier/Gold Standard (2023-02-24 00:00:00)

## 2024-09-17 NOTE — Progress Notes (Signed)
 "    Hematology/Oncology Consult note [facility]  Telephone:([phone] [phone]  Patient Care Team: Pcp, No as PCP - General Melanee Annah BROCKS, MD as Consulting Physician (Oncology)   Name of the patient: Carolyn [name]  [id number]  1980-12-16   Date of visit: 09/22/2024  Diagnosis-iron  deficiency anemia  Chief complaint/ Reason for visit-he establish follow-up for iron  deficiency anemia  Heme/Onc history: patient is a 43 year old African-American femaleWho was seen in the ER for symptoms of cramping abdominal pain.  She also reported chronic fatigue and had her blood work checked which showed an H&H of 7.8/28.6 with an MCV of 67 and a platelet count of 333.  White count normal at 10.  Looking back at her prior CBCs her hemoglobin has been chronically low between 6-7 dating back to 2017 with chronic microcytosis.  No recent iron  studies checked.  Her iron  levels back in May 2022 showed evidence of iron  deficiency.   Family history of colon cancer in her father.  She has had a prior upper endoscopy by Dr. Evelyn showed a hiatal hernia and nonbleeding gastric ulcers.  She has not had any colonoscopy so far.  Patient reports that her menstrual cycles are heavy in the last for about 7 days and the first 3 to 4 days are particularly heavy.  She did undergo a pelvic ultrasound in November 2022 as well which showed multiple uterine hemorrhoids and probably benign but a complex cyst in the right ovary.  She denies any consistent use of NSAIDs Goody powder or BC powder.  Presently denies any blood in her stool or urine.  She reports ongoing fatigue  Interval history-patient was seen by me last year for iron  deficiency anemia and subsequent she was lost to follow-up.  She again presented to the ER in Nov 2025 with significant abdominal pain and weakness.  At that time hg improved to 9.4 and she did not require admission or blood transfusion.  Abdominal pain improved  with GI cocktail.  She had CT abdomen and pelvis which showed multiple fibroids.  She reports that she plans to get in with obgyn after the holidays to discuss plan for fibroids.   Today she reports feeling fatigued, weak, with intermittent dizziness.  Denies any shortness of breath.  She reports that she is not currently bleeding and she does plan to follow up with OBGYN soon for fibroids.  Right now I will plan for her to follow up frequently to attempt to prevent hospitalization/ED visits due to bleeding concerns.     ECOG PS- 1 Pain scale- 0   Review of systems- Review of Systems  Constitutional:  Positive for malaise/fatigue.  Genitourinary:        Menorrhagia      No Known Allergies   Past Medical History:  Diagnosis Date   Anemia    Blood transfusion without reported diagnosis    Fibroids    GERD (gastroesophageal reflux disease)    Sickle cell trait    Ulcer of abdomen wall      Past Surgical History:  Procedure Laterality Date   BIOPSY  11/22/2023   Procedure: BIOPSY;  Surgeon: Jinny Carmine, MD;  Location: [facility] ENDOSCOPY;  Service: Endoscopy;;   ESOPHAGOGASTRODUODENOSCOPY N/A 11/22/2023   Procedure: ESOPHAGOGASTRODUODENOSCOPY (EGD);  Surgeon: Jinny Carmine, MD;  Location: [facility] ENDOSCOPY;  Service: Endoscopy;  Laterality: N/A;   ESOPHAGOGASTRODUODENOSCOPY (EGD) WITH PROPOFOL  N/A 02/26/2016   Procedure: ESOPHAGOGASTRODUODENOSCOPY (EGD) WITH PROPOFOL ;  Surgeon: Carmine Jinny, MD;  Location:  [facility] ENDOSCOPY;  Service: Endoscopy;  Laterality: N/A;   ESOPHAGOGASTRODUODENOSCOPY (EGD) WITH PROPOFOL  N/A 08/22/2022   Procedure: ESOPHAGOGASTRODUODENOSCOPY (EGD) WITH PROPOFOL ;  Surgeon: Toledo, Ladell POUR, MD;  Location: [facility] ENDOSCOPY;  Service: Gastroenterology;  Laterality: N/A;   HEMOSTASIS CLIP PLACEMENT  11/22/2023   Procedure: HEMOSTASIS CLIP PLACEMENT;  Surgeon: Jinny Carmine, MD;  Location: [facility] ENDOSCOPY;  Service: Endoscopy;;   WISDOM TOOTH EXTRACTION Bilateral     Social  History   Socioeconomic History   Marital status: Married    Spouse name: Not on file   Number of children: Not on file   Years of education: Not on file   Highest education level: Not on file  Occupational History   Not on file  Tobacco Use   Smoking status: Every Day    Current packs/day: 0.00    Average packs/day: 2.0 packs/day for 26.0 years (52.0 ttl pk-yrs)    Types: Cigarettes    Start date: 08/24/1996    Last attempt to quit: 08/24/2022    Years since quitting: 2.0   Smokeless tobacco: Never  Vaping Use   Vaping status: Never Used  Substance and Sexual Activity   Alcohol use: Not Currently    Comment: last ETOH 6mos ago   Drug use: Not Currently    Types: Marijuana   Sexual activity: Yes    Partners: Male    Birth control/protection: Injection, Implant  Other Topics Concern   Not on file  Social History Narrative   Not on file   Social Drivers of Health   Tobacco Use: High Risk (09/17/2024)   Patient History    Smoking Tobacco Use: Every Day    Smokeless Tobacco Use: Never    Passive Exposure: Not on file  Financial Resource Strain: Not on file  Food Insecurity: No Food Insecurity (04/28/2024)   Epic    Worried About Programme Researcher, Broadcasting/film/video in the Last Year: Never true    Ran Out of Food in the Last Year: Never true  Transportation Needs: No Transportation Needs (04/28/2024)   Epic    Lack of Transportation (Medical): No    Lack of Transportation (Non-Medical): No  Physical Activity: Not on file  Stress: Not on file  Social Connections: Socially Integrated (11/21/2023)   Social Connection and Isolation Panel    Frequency of Communication with Friends and Family: More than three times a week    Frequency of Social Gatherings with Friends and Family: More than three times a week    Attends Religious Services: More than 4 times per year    Active Member of [employer] or Organizations: Yes    Attends Banker Meetings: Never    Marital Status: Married   Catering Manager Violence: Not At Risk (08/24/2024)   Received from [facility]   Epic    Within the last year, have you been afraid of your partner or ex-partner?: No    Within the last year, have you been humiliated or emotionally abused in other ways by your partner or ex-partner?: No    Within the last year, have you been kicked, hit, slapped, or otherwise physically hurt by your partner or ex-partner?: No    Within the last year, have you been raped or forced to have any kind of sexual activity by your partner or ex-partner?: No  Depression (PHQ2-9): Low Risk (09/17/2024)   Depression (PHQ2-9)    PHQ-2 Score: 0  Alcohol Screen: Not on file  Housing: Low Risk (04/28/2024)  Epic    Unable to Pay for Housing in the Last Year: No    Number of Times Moved in the Last Year: 0    Homeless in the Last Year: No  Utilities: Not At Risk (04/28/2024)   Epic    Threatened with loss of utilities: No  Health Literacy: Not on file    Family History  Problem Relation Age of Onset   Diabetes Mother    Hypertension Mother    Lung cancer Father    Leukemia Father    Lung cancer Maternal Grandmother    Colon cancer Maternal Grandfather    Cancer Paternal Grandmother    Cancer Paternal Grandfather      Current Outpatient Medications:    ferrous sulfate  325 (65 FE) MG EC tablet, Take 1 tablet (325 mg total) by mouth daily with breakfast., Disp: 60 tablet, Rfl: 3   norethindrone  (AYGESTIN ) 5 MG tablet, Take 1 tablet (5 mg total) by mouth 2 (two) times daily. As directed, Disp: 60 tablet, Rfl: 1   pantoprazole  (PROTONIX ) 40 MG tablet, Take 1 tablet (40 mg total) by mouth 2 (two) times daily. (Patient not taking: Reported on 09/17/2024), Disp: 60 tablet, Rfl: 0  Physical exam:  Vitals:   09/17/24 1329  BP: 132/84  Pulse: 100  Resp: 16  Temp: 98.3 F (36.8 C)  TempSrc: Tympanic  SpO2: 98%  Weight: 163 lb (73.9 kg)    Physical Exam Cardiovascular:     Rate and Rhythm: Normal rate  and regular rhythm.     Heart sounds: Normal heart sounds.  Pulmonary:     Effort: Pulmonary effort is normal.     Breath sounds: Normal breath sounds.  Abdominal:     General: Bowel sounds are normal.     Palpations: Abdomen is soft.  Skin:    General: Skin is warm and dry.  Neurological:     Mental Status: She is alert and oriented to person, place, and time.         Latest Ref Rng & Units 08/21/2024    5:16 PM  CMP  Glucose 70 - 99 mg/dL 92   BUN 6 - 20 mg/dL 8   Creatinine 9.55 - 8.99 mg/dL 9.34   Sodium 864 - 854 mmol/L 140   Potassium 3.5 - 5.1 mmol/L 3.6   Chloride 98 - 111 mmol/L 106   CO2 22 - 32 mmol/L 26   Calcium 8.9 - 10.3 mg/dL 8.9   Total Protein 6.5 - 8.1 g/dL 7.1   Total Bilirubin 0.0 - 1.2 mg/dL 0.3   Alkaline Phos 38 - 126 U/L 54   AST 15 - 41 U/L 18   ALT 0 - 44 U/L 12       Latest Ref Rng & Units 09/17/2024    1:17 PM  CBC  WBC 4.0 - 10.5 K/uL 7.6   Hemoglobin 12.0 - 15.0 g/dL 7.8   Hematocrit 63.9 - 46.0 % 26.2   Platelets 150 - 400 K/uL 275     No images are attached to the encounter.  No results found.   Assessment and plan- Patient is a 43 y.o. female routine follow-up of iron  deficiency anemia  IDA: Etiology of her iron  deficiency anemia secondary to menorrhagia due to multiple uterine fibroids for which she will be seeing GYN soon for possible hysterectomy.  Proceed with venofer  today.  Due to frequent ED visits we will have her f/u weekly for repeat labs, venofer  and possible blood next  week as well as venofer .   Visit Diagnosis Iron  Deficiency anemia   Follow up plan: Proceed with Venofer  today  F/U in 1 week D1 cbc with diff, ferritin, iron  and tibc, hold tube and venofer  D2 possible blood transfusion if symptomatic and hg<7 F/U in 2 weeks lab only cbc and hold tube + venofer  D2 possible transfusion F/U in 3 weeks lab only cbc and hold tube + venofer  D2 possible transfusion F/U in 4 weeks lab only cbc and hold tube + venofer  D2  possible transfusion F/U in 6 weeks see md/np cbc, hold tube, ferritin, iron  & tibc possible venofer /blood LP    Morna Husband AGNP-C [facility] [phone] 09/22/2024 3:02 PM                "

## 2024-09-24 ENCOUNTER — Inpatient Hospital Stay

## 2024-09-25 ENCOUNTER — Inpatient Hospital Stay

## 2024-10-01 ENCOUNTER — Inpatient Hospital Stay

## 2024-10-02 ENCOUNTER — Inpatient Hospital Stay

## 2024-10-03 ENCOUNTER — Encounter: Payer: Self-pay | Admitting: Oncology

## 2024-10-08 ENCOUNTER — Encounter: Payer: Self-pay | Admitting: Oncology

## 2024-10-08 ENCOUNTER — Inpatient Hospital Stay: Payer: Self-pay

## 2024-10-08 ENCOUNTER — Inpatient Hospital Stay: Payer: Self-pay | Attending: Oncology

## 2024-10-08 DIAGNOSIS — D500 Iron deficiency anemia secondary to blood loss (chronic): Secondary | ICD-10-CM

## 2024-10-09 ENCOUNTER — Inpatient Hospital Stay: Payer: Self-pay

## 2024-10-10 ENCOUNTER — Encounter: Payer: Self-pay | Admitting: Oncology

## 2024-10-15 ENCOUNTER — Inpatient Hospital Stay

## 2024-10-16 ENCOUNTER — Inpatient Hospital Stay

## 2024-10-29 ENCOUNTER — Encounter: Payer: Self-pay | Admitting: Oncology

## 2024-10-29 ENCOUNTER — Inpatient Hospital Stay

## 2024-10-29 ENCOUNTER — Inpatient Hospital Stay: Admitting: Oncology

## 2024-10-30 ENCOUNTER — Inpatient Hospital Stay
